# Patient Record
Sex: Female | Born: 1940 | Race: Black or African American | Hispanic: No | Marital: Single
Health system: Southern US, Community
[De-identification: ages and names within clinical notes are randomized; demographics above are authoritative.]

## PROBLEM LIST (undated history)

## (undated) DIAGNOSIS — R0789 Other chest pain: Secondary | ICD-10-CM

## (undated) DIAGNOSIS — F419 Anxiety disorder, unspecified: Secondary | ICD-10-CM

## (undated) DIAGNOSIS — C801 Malignant (primary) neoplasm, unspecified: Secondary | ICD-10-CM

## (undated) DIAGNOSIS — K635 Polyp of colon: Secondary | ICD-10-CM

## (undated) DIAGNOSIS — J339 Nasal polyp, unspecified: Secondary | ICD-10-CM

## (undated) DIAGNOSIS — E785 Hyperlipidemia, unspecified: Secondary | ICD-10-CM

## (undated) DIAGNOSIS — J302 Other seasonal allergic rhinitis: Secondary | ICD-10-CM

## (undated) DIAGNOSIS — M1990 Unspecified osteoarthritis, unspecified site: Secondary | ICD-10-CM

## (undated) DIAGNOSIS — C50919 Malignant neoplasm of unspecified site of unspecified female breast: Secondary | ICD-10-CM

## (undated) DIAGNOSIS — Z9889 Other specified postprocedural states: Secondary | ICD-10-CM

## (undated) DIAGNOSIS — I341 Nonrheumatic mitral (valve) prolapse: Secondary | ICD-10-CM

## (undated) DIAGNOSIS — K219 Gastro-esophageal reflux disease without esophagitis: Secondary | ICD-10-CM

## (undated) DIAGNOSIS — T7840XA Allergy, unspecified, initial encounter: Secondary | ICD-10-CM

## (undated) DIAGNOSIS — I10 Essential (primary) hypertension: Secondary | ICD-10-CM

## (undated) HISTORY — DX: Hyperlipidemia, unspecified: E78.5

## (undated) HISTORY — DX: Anxiety disorder, unspecified: F41.9

## (undated) HISTORY — DX: Nonrheumatic mitral (valve) prolapse: I34.1

## (undated) HISTORY — PX: VOCAL CORD INJECTION: SHX2663

## (undated) HISTORY — DX: Other chest pain: R07.89

## (undated) HISTORY — DX: Nasal polyp, unspecified: J33.9

## (undated) HISTORY — DX: Essential (primary) hypertension: I10

## (undated) HISTORY — DX: Polyp of colon: K63.5

## (undated) HISTORY — PX: OTHER SURGICAL HISTORY: SHX169

## (undated) HISTORY — PX: BUNIONECTOMY: SHX129

## (undated) HISTORY — DX: Other specified postprocedural states: Z98.890

## (undated) HISTORY — PX: ABDOMINAL HYSTERECTOMY: SHX81

## (undated) HISTORY — DX: Other seasonal allergic rhinitis: J30.2

## (undated) HISTORY — DX: Allergy, unspecified, initial encounter: T78.40XA

## (undated) HISTORY — DX: Unspecified osteoarthritis, unspecified site: M19.90

## (undated) HISTORY — PX: COLONOSCOPY: SHX174

## (undated) HISTORY — DX: Gastro-esophageal reflux disease without esophagitis: K21.9

## (undated) HISTORY — PX: LUNG SURGERY: SHX703

## (undated) HISTORY — DX: Malignant neoplasm of unspecified site of unspecified female breast: C50.919

## (undated) HISTORY — PX: POLYPECTOMY: SHX149

---
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
## 1997-09-27 ENCOUNTER — Emergency Department (HOSPITAL_COMMUNITY): Admission: EM | Admit: 1997-09-27 | Discharge: 1997-09-27 | Payer: Self-pay | Admitting: Emergency Medicine

## 1997-10-02 ENCOUNTER — Other Ambulatory Visit: Admission: RE | Admit: 1997-10-02 | Discharge: 1997-10-02 | Payer: Self-pay | Admitting: Obstetrics and Gynecology

## 1997-10-29 ENCOUNTER — Ambulatory Visit (HOSPITAL_BASED_OUTPATIENT_CLINIC_OR_DEPARTMENT_OTHER): Admission: RE | Admit: 1997-10-29 | Discharge: 1997-10-29 | Payer: Self-pay | Admitting: Orthopaedic Surgery

## 1999-01-14 ENCOUNTER — Emergency Department (HOSPITAL_COMMUNITY): Admission: EM | Admit: 1999-01-14 | Discharge: 1999-01-14 | Payer: Self-pay | Admitting: Emergency Medicine

## 1999-03-16 ENCOUNTER — Other Ambulatory Visit: Admission: RE | Admit: 1999-03-16 | Discharge: 1999-03-16 | Payer: Self-pay | Admitting: Obstetrics and Gynecology

## 1999-11-16 ENCOUNTER — Encounter: Payer: Self-pay | Admitting: Orthopaedic Surgery

## 1999-11-16 ENCOUNTER — Encounter: Admission: RE | Admit: 1999-11-16 | Discharge: 1999-11-16 | Payer: Self-pay | Admitting: Orthopaedic Surgery

## 1999-11-17 ENCOUNTER — Ambulatory Visit (HOSPITAL_BASED_OUTPATIENT_CLINIC_OR_DEPARTMENT_OTHER): Admission: RE | Admit: 1999-11-17 | Discharge: 1999-11-17 | Payer: Self-pay | Admitting: Orthopaedic Surgery

## 1999-11-17 HISTORY — PX: KNEE ARTHROSCOPY W/ PARTIAL MEDIAL MENISCECTOMY: SHX1882

## 1999-11-17 HISTORY — PX: KNEE ARTHROSCOPY W/ LATERAL RELEASE: SHX1873

## 2000-06-09 ENCOUNTER — Inpatient Hospital Stay (HOSPITAL_COMMUNITY): Admission: EM | Admit: 2000-06-09 | Discharge: 2000-06-10 | Payer: Self-pay | Admitting: Emergency Medicine

## 2000-06-09 ENCOUNTER — Encounter: Payer: Self-pay | Admitting: Emergency Medicine

## 2000-06-13 ENCOUNTER — Other Ambulatory Visit: Admission: RE | Admit: 2000-06-13 | Discharge: 2000-06-13 | Payer: Self-pay | Admitting: Obstetrics and Gynecology

## 2000-08-22 ENCOUNTER — Emergency Department (HOSPITAL_COMMUNITY): Admission: EM | Admit: 2000-08-22 | Discharge: 2000-08-22 | Payer: Self-pay | Admitting: Emergency Medicine

## 2000-08-23 ENCOUNTER — Emergency Department (HOSPITAL_COMMUNITY): Admission: EM | Admit: 2000-08-23 | Discharge: 2000-08-23 | Payer: Self-pay | Admitting: Emergency Medicine

## 2002-02-19 ENCOUNTER — Other Ambulatory Visit: Admission: RE | Admit: 2002-02-19 | Discharge: 2002-02-19 | Payer: Self-pay | Admitting: Obstetrics and Gynecology

## 2002-06-02 ENCOUNTER — Emergency Department (HOSPITAL_COMMUNITY): Admission: EM | Admit: 2002-06-02 | Discharge: 2002-06-02 | Payer: Self-pay | Admitting: Emergency Medicine

## 2002-06-02 ENCOUNTER — Encounter: Payer: Self-pay | Admitting: Emergency Medicine

## 2003-01-25 ENCOUNTER — Inpatient Hospital Stay (HOSPITAL_COMMUNITY): Admission: RE | Admit: 2003-01-25 | Discharge: 2003-01-31 | Payer: Self-pay | Admitting: General Surgery

## 2003-01-25 ENCOUNTER — Encounter (INDEPENDENT_AMBULATORY_CARE_PROVIDER_SITE_OTHER): Payer: Self-pay | Admitting: Specialist

## 2003-01-25 HISTORY — PX: OTHER SURGICAL HISTORY: SHX169

## 2003-04-04 ENCOUNTER — Ambulatory Visit (HOSPITAL_COMMUNITY): Admission: RE | Admit: 2003-04-04 | Discharge: 2003-04-04 | Payer: Self-pay | Admitting: Internal Medicine

## 2003-05-03 ENCOUNTER — Encounter: Admission: RE | Admit: 2003-05-03 | Discharge: 2003-05-03 | Payer: Self-pay | Admitting: Internal Medicine

## 2004-03-10 ENCOUNTER — Ambulatory Visit: Payer: Self-pay | Admitting: Internal Medicine

## 2004-03-30 ENCOUNTER — Encounter (INDEPENDENT_AMBULATORY_CARE_PROVIDER_SITE_OTHER): Payer: Self-pay | Admitting: Specialist

## 2004-03-30 ENCOUNTER — Ambulatory Visit (HOSPITAL_COMMUNITY): Admission: RE | Admit: 2004-03-30 | Discharge: 2004-03-30 | Payer: Self-pay | Admitting: Internal Medicine

## 2005-03-05 ENCOUNTER — Emergency Department (HOSPITAL_COMMUNITY): Admission: EM | Admit: 2005-03-05 | Discharge: 2005-03-05 | Payer: Self-pay | Admitting: Family Medicine

## 2005-03-08 ENCOUNTER — Emergency Department (HOSPITAL_COMMUNITY): Admission: EM | Admit: 2005-03-08 | Discharge: 2005-03-08 | Payer: Self-pay | Admitting: Emergency Medicine

## 2005-11-02 ENCOUNTER — Encounter: Admission: RE | Admit: 2005-11-02 | Discharge: 2005-11-02 | Payer: Self-pay | Admitting: Internal Medicine

## 2006-03-11 ENCOUNTER — Encounter: Admission: RE | Admit: 2006-03-11 | Discharge: 2006-03-11 | Payer: Self-pay | Admitting: Pediatrics

## 2006-05-15 ENCOUNTER — Ambulatory Visit: Payer: Self-pay | Admitting: Internal Medicine

## 2006-05-15 ENCOUNTER — Observation Stay (HOSPITAL_COMMUNITY): Admission: EM | Admit: 2006-05-15 | Discharge: 2006-05-16 | Payer: Self-pay | Admitting: Emergency Medicine

## 2006-05-18 ENCOUNTER — Ambulatory Visit: Payer: Self-pay

## 2006-06-07 ENCOUNTER — Ambulatory Visit: Payer: Self-pay | Admitting: Internal Medicine

## 2006-07-21 ENCOUNTER — Ambulatory Visit: Payer: Self-pay | Admitting: Internal Medicine

## 2006-08-03 ENCOUNTER — Encounter: Payer: Self-pay | Admitting: Cardiology

## 2006-08-03 ENCOUNTER — Ambulatory Visit: Payer: Self-pay

## 2006-08-24 ENCOUNTER — Ambulatory Visit: Payer: Self-pay | Admitting: Internal Medicine

## 2006-08-31 ENCOUNTER — Emergency Department (HOSPITAL_COMMUNITY): Admission: EM | Admit: 2006-08-31 | Discharge: 2006-08-31 | Payer: Self-pay | Admitting: Family Medicine

## 2006-09-01 ENCOUNTER — Ambulatory Visit: Payer: Self-pay

## 2007-02-01 ENCOUNTER — Ambulatory Visit: Payer: Self-pay | Admitting: Internal Medicine

## 2007-08-28 ENCOUNTER — Ambulatory Visit: Payer: Self-pay | Admitting: Internal Medicine

## 2008-02-27 ENCOUNTER — Ambulatory Visit: Payer: Self-pay | Admitting: Internal Medicine

## 2008-03-25 ENCOUNTER — Ambulatory Visit: Payer: Self-pay | Admitting: Internal Medicine

## 2008-04-26 HISTORY — PX: BREAST SURGERY: SHX581

## 2008-07-29 ENCOUNTER — Ambulatory Visit: Payer: Self-pay | Admitting: Internal Medicine

## 2008-07-30 ENCOUNTER — Encounter: Admission: RE | Admit: 2008-07-30 | Discharge: 2008-07-30 | Payer: Self-pay | Admitting: Internal Medicine

## 2008-08-27 ENCOUNTER — Ambulatory Visit: Payer: Self-pay | Admitting: Internal Medicine

## 2008-08-28 ENCOUNTER — Encounter (INDEPENDENT_AMBULATORY_CARE_PROVIDER_SITE_OTHER): Payer: Self-pay | Admitting: Otolaryngology

## 2008-08-28 ENCOUNTER — Ambulatory Visit (HOSPITAL_COMMUNITY): Admission: RE | Admit: 2008-08-28 | Discharge: 2008-08-29 | Payer: Self-pay | Admitting: Otolaryngology

## 2008-08-28 HISTORY — PX: FUNCTIONAL ENDOSCOPIC SINUS SURGERY: SUR616

## 2008-11-26 ENCOUNTER — Emergency Department (HOSPITAL_COMMUNITY): Admission: EM | Admit: 2008-11-26 | Discharge: 2008-11-26 | Payer: Self-pay | Admitting: Emergency Medicine

## 2008-11-26 ENCOUNTER — Ambulatory Visit: Payer: Self-pay | Admitting: Internal Medicine

## 2009-01-06 ENCOUNTER — Encounter (INDEPENDENT_AMBULATORY_CARE_PROVIDER_SITE_OTHER): Payer: Self-pay | Admitting: *Deleted

## 2009-01-17 ENCOUNTER — Ambulatory Visit: Payer: Self-pay | Admitting: Internal Medicine

## 2009-01-17 DIAGNOSIS — I10 Essential (primary) hypertension: Secondary | ICD-10-CM

## 2009-01-17 DIAGNOSIS — R072 Precordial pain: Secondary | ICD-10-CM

## 2009-03-04 ENCOUNTER — Encounter (INDEPENDENT_AMBULATORY_CARE_PROVIDER_SITE_OTHER): Payer: Self-pay | Admitting: *Deleted

## 2009-03-04 ENCOUNTER — Ambulatory Visit: Payer: Self-pay | Admitting: Internal Medicine

## 2009-03-18 ENCOUNTER — Ambulatory Visit: Payer: Self-pay | Admitting: Internal Medicine

## 2009-03-18 DIAGNOSIS — Z9889 Other specified postprocedural states: Secondary | ICD-10-CM | POA: Insufficient documentation

## 2009-03-18 HISTORY — DX: Other specified postprocedural states: Z98.890

## 2009-03-25 ENCOUNTER — Encounter: Payer: Self-pay | Admitting: Internal Medicine

## 2009-03-27 LAB — HM COLONOSCOPY

## 2009-04-07 ENCOUNTER — Ambulatory Visit: Payer: Self-pay | Admitting: Internal Medicine

## 2009-09-17 ENCOUNTER — Emergency Department (HOSPITAL_COMMUNITY): Admission: EM | Admit: 2009-09-17 | Discharge: 2009-09-17 | Payer: Self-pay | Admitting: Family Medicine

## 2009-10-07 ENCOUNTER — Ambulatory Visit: Payer: Self-pay | Admitting: Internal Medicine

## 2010-04-07 ENCOUNTER — Ambulatory Visit: Payer: Self-pay | Admitting: Internal Medicine

## 2010-08-02 LAB — POCT I-STAT, CHEM 8
BUN: 21 mg/dL (ref 6–23)
Calcium, Ion: 1.17 mmol/L (ref 1.12–1.32)
Chloride: 108 mEq/L (ref 96–112)
Creatinine, Ser: 0.7 mg/dL (ref 0.4–1.2)
Glucose, Bld: 100 mg/dL — ABNORMAL HIGH (ref 70–99)
HCT: 35 % — ABNORMAL LOW (ref 36.0–46.0)
Hemoglobin: 11.9 g/dL — ABNORMAL LOW (ref 12.0–15.0)
Potassium: 3.8 mEq/L (ref 3.5–5.1)
Sodium: 138 mEq/L (ref 135–145)
TCO2: 24 mmol/L (ref 0–100)

## 2010-08-02 LAB — POCT CARDIAC MARKERS
CKMB, poc: <1 ng/mL — ABNORMAL LOW (ref 1.0–8.0)
CKMB, poc: <1 ng/mL — ABNORMAL LOW (ref 1.0–8.0)
Myoglobin, poc: 71 ng/mL (ref 12–200)
Myoglobin, poc: 97.9 ng/mL (ref 12–200)
Troponin i, poc: <0.05 ng/mL (ref 0.00–0.09)
Troponin i, poc: <0.05 ng/mL (ref 0.00–0.09)

## 2010-08-02 LAB — CBC
HCT: 34.3 % — ABNORMAL LOW (ref 36.0–46.0)
Hemoglobin: 11.7 g/dL — ABNORMAL LOW (ref 12.0–15.0)
MCHC: 34.1 g/dL (ref 30.0–36.0)
MCV: 91.8 fL (ref 78.0–100.0)
Platelets: 264 10*3/uL (ref 150–400)
RBC: 3.74 MIL/uL — ABNORMAL LOW (ref 3.87–5.11)
RDW: 13.2 % (ref 11.5–15.5)
WBC: 6.3 10*3/uL (ref 4.0–10.5)

## 2010-08-02 LAB — PROTIME-INR
INR: 1 (ref 0.00–1.49)
Prothrombin Time: 13.5 seconds (ref 11.6–15.2)

## 2010-08-02 LAB — DIFFERENTIAL
Basophils Absolute: 0 10*3/uL (ref 0.0–0.1)
Basophils Relative: 0 % (ref 0–1)
Eosinophils Absolute: 0.8 10*3/uL — ABNORMAL HIGH (ref 0.0–0.7)
Eosinophils Relative: 12 % — ABNORMAL HIGH (ref 0–5)
Lymphocytes Relative: 18 % (ref 12–46)
Lymphs Abs: 1.1 10*3/uL (ref 0.7–4.0)
Monocytes Absolute: 0.8 10*3/uL (ref 0.1–1.0)
Monocytes Relative: 12 % (ref 3–12)
Neutro Abs: 3.6 10*3/uL (ref 1.7–7.7)
Neutrophils Relative %: 57 % (ref 43–77)

## 2010-08-05 LAB — CBC
HCT: 38.2 % (ref 36.0–46.0)
Hemoglobin: 13.2 g/dL (ref 12.0–15.0)
MCHC: 34.5 g/dL (ref 30.0–36.0)
MCV: 92.8 fL (ref 78.0–100.0)
Platelets: 231 10*3/uL (ref 150–400)
RBC: 4.12 MIL/uL (ref 3.87–5.11)
RDW: 13.1 % (ref 11.5–15.5)
WBC: 5 10*3/uL (ref 4.0–10.5)

## 2010-08-05 LAB — BASIC METABOLIC PANEL
BUN: 15 mg/dL (ref 6–23)
CO2: 29 mEq/L (ref 19–32)
Calcium: 9.6 mg/dL (ref 8.4–10.5)
Chloride: 106 mEq/L (ref 96–112)
Creatinine, Ser: 0.59 mg/dL (ref 0.4–1.2)
GFR calc Af Amer: >60 mL/min (ref >60)
GFR calc non Af Amer: >60 mL/min (ref >60)
Glucose, Bld: 111 mg/dL — ABNORMAL HIGH (ref 70–99)
Potassium: 3.7 mEq/L (ref 3.5–5.1)
Sodium: 141 mEq/L (ref 135–145)

## 2010-08-05 LAB — URINE MICROSCOPIC-ADD ON

## 2010-08-05 LAB — URINALYSIS, ROUTINE W REFLEX MICROSCOPIC
Bilirubin Urine: NEGATIVE
Glucose, UA: NEGATIVE mg/dL
Hgb urine dipstick: NEGATIVE
Ketones, ur: NEGATIVE mg/dL
Nitrite: NEGATIVE
Protein, ur: NEGATIVE mg/dL
Specific Gravity, Urine: 1.018 (ref 1.005–1.030)
Urobilinogen, UA: 0.2 mg/dL (ref 0.0–1.0)
pH: 6.5 (ref 5.0–8.0)

## 2010-08-11 ENCOUNTER — Other Ambulatory Visit: Payer: Self-pay | Admitting: Internal Medicine

## 2010-08-22 ENCOUNTER — Encounter: Payer: Self-pay | Admitting: Internal Medicine

## 2010-08-25 ENCOUNTER — Encounter: Payer: Self-pay | Admitting: Internal Medicine

## 2010-08-25 ENCOUNTER — Ambulatory Visit (INDEPENDENT_AMBULATORY_CARE_PROVIDER_SITE_OTHER): Payer: Medicare Other | Admitting: Internal Medicine

## 2010-08-25 VITALS — BP 116/66 | HR 65 | Ht 62.5 in | Wt 135.0 lb

## 2010-08-25 DIAGNOSIS — I10 Essential (primary) hypertension: Secondary | ICD-10-CM

## 2010-08-25 DIAGNOSIS — R072 Precordial pain: Secondary | ICD-10-CM

## 2010-08-25 NOTE — Assessment & Plan Note (Signed)
Resolved. Myoview normal. No further ischemic w/u needed at this point.

## 2010-08-25 NOTE — Progress Notes (Signed)
HPI:  Amanda Jackson is a delightful 70 year old woman history of hypertension and atypical chest pain with a normal Myoview.  She also has seasonal allergies.  She presents today for routine followup.  Otherwise she is doing very well.  She continues to work at [employer] and be quite active. She is under a lot of stress due to her son. She denies any orthopnea, no PND, no lower extremity edema.  She has been compliant with all her medications.BP has been well controlled.  ROS: All systems negative except as listed in HPI, PMH and Problem List.  Past Medical History  Diagnosis Date  . Hyperlipidemia     followed by Dr. Lenord Fellers  . Hypertension   . Atypical chest pain   . Seasonal allergies   . Hx of colonoscopy 03/18/09    Current Outpatient Prescriptions  Medication Sig Dispense Refill  . albuterol (PROVENTIL HFA) 108 (90 BASE) MCG/ACT inhaler as needed.        Marland Kitchen amLODipine (NORVASC) 10 MG tablet TAKE 1 TABLET BY MOUTH TWICE A DAY  60 tablet  0  . aspirin 81 MG EC tablet Take 81 mg by mouth daily.        . carvedilol (COREG) 12.5 MG tablet Take 12.5 mg by mouth 2 (two) times daily.        . cyclobenzaprine (FLEXERIL) 10 MG tablet Take 10 mg by mouth at bedtime.        . folic acid-pyridoxine-cyancobalamin (FOLBIC) 2.5-25-2 MG TABS Take 1 tablet by mouth daily.        Marland Kitchen guaiFENesin (MUCINEX) 600 MG 12 hr tablet Take 600 mg by mouth daily.        Marland Kitchen ibuprofen (ADVIL,MOTRIN) 800 MG tablet as needed.        Marland Kitchen LORazepam (ATIVAN) 0.5 MG tablet Take 0.5 mg by mouth 2 (two) times daily as needed.        Marland Kitchen losartan-hydrochlorothiazide (HYZAAR) 100-25 MG per tablet Take 1 tablet by mouth daily.        . mometasone (ASMANEX 60 METERED DOSES) 220 MCG/INH inhaler Inhale 2 puffs into the lungs daily.        . mometasone (NASONEX) 50 MCG/ACT nasal spray 2 sprays by Nasal route 2 (two) times daily.        . montelukast (SINGULAIR) 10 MG tablet Take 10 mg by mouth daily.        . Multiple Vitamins-Minerals  (CENTRUM) tablet Take 1 tablet by mouth daily.        . nitroGLYCERIN (NITROSTAT) 0.4 MG SL tablet as directed.        Marland Kitchen omeprazole (PRILOSEC) 20 MG capsule Take 20 mg by mouth daily.        . potassium chloride SA (K-DUR,KLOR-CON) 20 MEQ tablet Take 20 mEq by mouth 2 (two) times daily.        Marland Kitchen DISCONTD: Pseudoephedrine-Guaifenesin (EXEFEN-IR) 60-400 MG TABS Take 1/2 tablet as needed.          PHYSICAL EXAM: Filed Vitals:   08/25/10 0843  BP: 116/66  Pulse: 65  Manual BP recheck 104/58 General:  Well appearing. No resp difficulty HEENT: normal Neck: supple. JVP flat. Carotids 2+ bilaterally; no bruits. No lymphadenopathy or thryomegaly appreciated. Cor: PMI normal. Regular rate & rhythm. No rubs, gallops or murmurs. Lungs: clear Abdomen: soft, nontender, nondistended. No hepatosplenomegaly. No bruits or masses. Good bowel sounds. Extremities: no cyanosis, clubbing, rash, edema Neuro: alert & orientedx3, cranial nerves grossly intact. Moves all 4 extremities w/o  difficulty. Affect pleasant.    ECG: NSR 65 No ST-T wave abnormalities.     ASSESSMENT & PLAN:

## 2010-08-25 NOTE — Patient Instructions (Signed)
Your physician recommends that you schedule a follow-up appointment in: 12 months with Dr Bensimhon   

## 2010-08-25 NOTE — Assessment & Plan Note (Signed)
Doing very well. BP a bit on low end and I told her she can cut amlodipine in half if symptomatic.

## 2010-09-08 NOTE — Consult Note (Signed)
Amanda Jackson, Amanda Jackson NO.:  [id number]   MEDICAL RECORD NO.:  [id number]          PATIENT TYPE:  EMS   LOCATION:  MAJO                         FACILITY:  [facility]   PHYSICIAN:  Duke Salvia, MD, FACCDATE OF BIRTH:  05-Jul-1940   DATE OF CONSULTATION:  11/26/2008  DATE OF DISCHARGE:  11/26/2008                                 CONSULTATION   PRIMARY CARDIOLOGIST:  Bevelyn Buckles. Bensimhon, MD   PRIMARY CARE PHYSICIAN:  Luanna Cole. Baxley, MD   REASON FOR CONSULTATION:  Back pain/chest pain.   HISTORY OF PRESENT ILLNESS:  Amanda Jackson is a 70 year old African American  female with no known history of CAD, but history of atypical chest pain  with negative workup in 2008, but as well as risk factors including well-  controlled hypertension and hyperlipidemia, presenting with 2 days of  sharp left upper back pain and mild burning chest pain associated  intermittently.  The patient reports positional, sharp, brief (lasting  few seconds), upper left back pain without association with exertion,  nor any nausea/vomiting, palpitations, or shortness of breath.  Today,  the patient had an episode of mild burning chest discomfort in the  substernal area, which she has experienced many times before and after  reading about the possibility of back pain being indication of coronary  artery disease in women, she decided to present to the ED for further  eval.  Since arriving in the ED, her vital signs have been stable and  within normal limits, point of care markers are negative x1, and EKG  without acute changes, nor any significant change from prior tracings.   PAST MEDICAL HISTORY:  1. History of atypical chest pain with negative Myoview and normal EF      in 2008.  2. Hypertension (diet controlled on meds).  3. Hyperlipidemia (diet controlled).  4. Asthma.  5. Seasonal allergies.   SURGICAL HISTORY:  1. Colectomy in 2004.  2. Hysterectomy.  3. Bunionectomy.  4. Right knee  surgery.  5. Bilateral ethmoidectomies and bilateral maxillary sinus ostial      enlargement.   SOCIAL HISTORY:  The patient lives in [place] with her housemate.  She works part-time at [employer] and is active on the job.  She has no  significant smoking history.  Drinks one to two glasses of wine or a  beer every day.  Denies illicit drug use.  No herbal medications.  Eats  a heart healthy diet and walks all days at work, otherwise no regular  exercise.   FAMILY HISTORY:  Mother deceased at age 58 from liver cancer, also  history of hypertension.  Father deceased at age 30 from lung cancer.  Younger sister has history of colon cancer.   REVIEW OF SYSTEMS:  Chronic orthopnea without recent change, minimal  increased to dyspnea on exertion with increased seep upstairs, recurrent  left knee pain currently slightly exacerbated, mild lightheadedness in  the shower today, but without near syncope.  Otherwise, see HPI.  All  other systems reviewed and negative.   CODE STATUS:  Full.   ALLERGIES AND  INTOLERANCES:  1. PREDNISONE.  2. TETANUS TOXOID.   MEDICATIONS:  1. Hyzaar 100/25 mg p.o. daily.  2. Potassium 20 mEq p.o. daily.  3. Enteric-coated aspirin 81 mg p.o. daily.  4. Nasonex p.r.n.  5. Asmanex 220 two puffs daily.  6. Multivitamin daily.  7. Carvedilol 12.5 mg p.o. b.i.d.  8. Singulair p.r.n.  9. Norvasc 10 mg p.o. daily.  10.Folbic tablets.  11.Premarin Vaginal cream.  12.Lorazepam 0.5 mg p.o. b.i.d. p.r.n.  13.Proventil HFA two puffs p.r.n.  14.Ibuprofen 800 mg p.r.n. (the patient has only taken one today).  15.Calcium 600 mg plus D3 two tablets daily.   PHYSICAL EXAMINATION:  VITAL SIGNS:  Temperature 97.8 degrees  Fahrenheit, BP 108/63, pulse 71, respiration rate 20, O2 saturation 97%  on room air.  GENERAL:  Alert and oriented x3 in no apparent distress, able to speak  easily without respiratory distress.  The patient winces when sitting up  during exam.  HEAD:   Normocephalic, atraumatic.  Pupils are equal, round, and  reactive.  Extraocular muscles are intact.  Nares are patent without  discharge.  Oropharynx is without erythema or exudates.  NECK:  Supple without lymphadenopathy.  No thyromegaly.  No JVD.  HEART:  Rate is regular with audible S1 and S2.  No clicks, rubs,  murmurs, or gallops.  Pulses are 2+ and equal in both upper and lower  extremities bilaterally.  LUNGS:  Clear to auscultation bilaterally.  SKIN:  Without rashes, lesions, or petechiae.  ABDOMEN:  Soft, nontender, nondistended.  Normal abdominal bowel sounds.  No rebound or guarding.  No hepatosplenomegaly and no pulsations.  EXTREMITIES:  No clubbing, cyanosis, or edema.  No petechiae.  MUSCULOSKELETAL:  There was no joint deformity, mild left knee effusion.  No spinal or CVA tenderness.  Noted, left upper back is not tender.  NEUROLOGIC:  Cranial nerves II through XII grossly intact.  Strength is  5/5 in all extremities and axial groups.  Normal station throughout.  Normal cerebellar function.   RADIOLOGY:  Chest x-ray shows no evidence of acute cardiopulmonary  disease.   EKG shows normal sinus rhythm at a rate of 84 with an no acute ST-T-wave  changes, no significant Q-waves, normal axis, no evidence of  hypertrophy, intervals within normal limits, and no significant change  from prior tracing completed on May 15, 2006.   LABS:  WBC 6.3 with normal differential, HGB 11.9, HCT 55.0, PLT count  264.  Sodium 138, potassium 3.8, chloride 108, BUN 21, creatinine 0.7,  glucose 100, point of care markers negative x1, PT 13.5, INR 1.0.   ASSESSMENT AND PLAN:  Amanda Jackson is a 70 year old African American female  with no known history of coronary artery disease, a negative workup in  2008 with a normal ejection fraction.  Her only risk factors being age,  well-controlled hypertension, and hyperlipidemia, presenting with  atypical chest pain/back pain without any objective  evidence of a  cardiac etiology.   IMPRESSION:  1. Four days of severe positional back pain, unrelenting and      aggravated by movement and breathing.  2. Atypical hour recurrent epigastric pain similar to the previous      chest pain evaluation by Dr. Gala Romney with negative Myoview.  3. Hypertension.  4. Allergic rhinitis.   Major complaint is her back pain, which is most likely musculoskeletal.  Her chest pain/epigastric discomfort has been previously evaluated and  is felt not to be a cardiac in origin (symptoms unchanged).  RECOMMENDATIONS:  1. Medically eval for back issue.  2. We will schedule follow up with Dr. Gala Romney.  3. Could check D-dimer, although low pretest probability.  4. Okay to discharge from ER from cardiology perspective.      Jarrett Ables, [facility]      Duke Salvia, MD, [facility]  Electronically Signed    MS/MEDQ  D:  11/26/2008  T:  11/27/2008  Job:  [id number]

## 2010-09-08 NOTE — Assessment & Plan Note (Signed)
[facility]                            CARDIOLOGY OFFICE NOTE   Amanda Jackson, Amanda Jackson                       MRN:          [id number]  DATE:03/25/2008                            DOB:          Jun 29, 1940    PRIMARY CARE PHYSICIAN:  Amanda Cole. Lenord Fellers, MD.   INTERVAL HISTORY:  Amanda Jackson is a delightful 70 year old woman history of  hypertension and atypical chest pain with a normal Myoview.  She also  has seasonal allergies.  She presents today for routine followup.  Overall, she is doing fairly well.  She continues to work at [employer] and  be quite active.  She does have occasional atypical chest pain, but this  is not related to exertion.  It is a fleeting pain.  She has not been  following her blood pressure closely.  She denies any orthopnea, no PND,  no lower extremity edema.  She has been compliant with all her  medications.   She does note that she has been having a difficult time, as she is  worried about her 75 year old granddaughter who is living with her  daughter-in-law, and she feels like she is not getting the care she  needs.   CURRENT MEDICATIONS:  1. Hyzaar 100/25.  2. Potassium 20 a day.  3. Baby aspirin 81 a day.  4. Nasonex.  5. Asmanex.  6. Multivitamin.  7. Carvedilol 12.5 b.i.d.  8. Singulair.  9. Norvasc 10 b.i.d.  10.Folbic tablets.   PHYSICAL EXAMINATION:  GENERAL:  She looks younger than her stated age,  ambulates around the clinic without any respiratory difficulty.  VITAL SIGNS:  Blood pressure is 108/66, heart rate is 67, weight is 143.  HEENT:  Normal.  NECK:  Supple.  There is no JVD.  Carotids are 2+ bilaterally without  any bruits.  There is no lymphadenopathy or thyromegaly.  CARDIAC:  PMI  is nondisplaced.  She is regular with an S4, no murmurs.  LUNGS:  Clear.  ABDOMEN:  Soft, nontender, nondistended.  No hepatosplenomegaly, no  bruits, no masses.  Good bowel sounds.  EXTREMITIES:  Warm with no  cyanosis, clubbing,  or edema.  SKIN:  No rash.  NEURO:  Alert and oriented x3.  Cranial nerves II through XII are  intact.  Moves all 4 extremities without difficulty.  Affect is very  pleasant.   EKG shows sinus rhythm at a rate of 67.  No ST-T wave abnormalities.   ASSESSMENT AND PLAN:  1. Chest pain, this is atypical.  Myoview is normal.  We will not      pursue further workup.  2. Hypertension, well controlled.  Continue current regimen.  3. Hyperlipidemia followed by Dr. Lenord Jackson.   DISPOSITION:  We will see her back in 6-9 months for routine followup.     Bevelyn Buckles. Bensimhon, MD  Electronically Signed    DRB/MedQ  DD: 03/25/2008  DT: 03/26/2008  Job #: [id number]   cc:   Amanda Jackson, M.D.

## 2010-09-08 NOTE — Assessment & Plan Note (Signed)
[facility]                            CARDIOLOGY OFFICE NOTE   Amanda Jackson, Amanda Jackson                       MRN:          [id number]  DATE:08/24/2006                            DOB:          [birth year]    PRIMARY CARE PHYSICIAN:  Luanna Cole. Lenord Fellers, M.D.   INTERVAL HISTORY:  Amanda Jackson is a delightful, 70 year old woman with a  history of hypertension, atypical chest pain, and a recent Myoview that  showed an EF of 67 percent, no ischemia.  She presents today for routine  followup of her blood pressure.   Overall, she is doing well.  She denies any chest pain or shortness of  breath.  She did have an episode of about one hour where she felt numb  on her right arm and her right leg.  There was no real motor weakness,  no other neurologic signs.  She denies any slurred speech or other  problems.  This has not recurred.  She has not been taking her blood  pressure routinely.   CURRENT MEDICATIONS:  1. Hyzaar 100/25.  2. Potassium 20 b.i.d.  3. Ativan 0.5 b.i.d.  4. Baby aspirin 81 mg a day.  5. Nasonex.  6. Asmanex.  7. Carvedilol  12.5 b.i.d.  8. Norvasc 10 a day.   PHYSICAL EXAMINATION:  GENERAL:  She is well-appearing, in no acute  distress.  Ambulates around the clinic without any respiratory  difficulty.  VITAL SIGNS:  Blood pressure 131/80.  Heart rate 85.  Weight 142.  HEENT:  Normal.  NECK:  Supple.  No JVD.  Carotids are 2+ bilaterally, no bruits.  There  is no lymphadenopathy or thyromegaly.  CARDIAC:  She has a regular rate and rhythm with no murmurs, rubs or  gallops.  LUNGS:  Clear.  ABDOMEN:  Soft, nontender, nondistended.  No hepatosplenomegaly, no  bruits, no masses.  Good bowel sounds.  EXTREMITIES:  Warm with no cyanosis, clubbing, or edema.  No rash.  NEUROLOGIC:  She was alert and oriented x3.  Cranial nerves II-XII were  grossly intact.  She moves all four extremities without difficulty.  There is no pronator drift.  Her grip  strength is normal.  She has  normal strength in both lower extremities.   ASSESSMENT/PLAN:  1. Hypertension.  This is still just a bit elevated.  She does have an      electronic blood pressure cuff at home and I have asked her to keep      a blood pressure log and watch closely.  If she has multiple      readings over 130, then she is to call me or Dr. Lenord Fellers to further      discuss.  2. Right-sided numbness.  I do not think this represents transient      ischemic attack symptoms but I do think it warrants a carotid      ultrasound just to be sure.  We will go ahead and I have taken the      liberty to order this.   DISPOSITION:  We will see her  back in about six months for routine  followup.     Bevelyn Buckles. Bensimhon, MD     DRB/MedQ  DD: 08/24/2006  DT: 08/25/2006  Job #: [id number]   cc:   Luanna Cole. Lenord Fellers, M.D.

## 2010-09-08 NOTE — H&P (Signed)
NAMEGEANINE, Amanda Jackson NO.:  [id number]   MEDICAL RECORD NO.:  [id number]          PATIENT TYPE:  OIB   LOCATION:  5158                         FACILITY:  [facility]   PHYSICIAN:  Hermelinda Medicus, M.D.   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  She has had obstruction of her nose.  She has also been worked up  completely for allergies and has been treated in the past for allergic  rhinitis.  She has more recently been on Singulair and has been in the  past on Claritin.  Under Dr. Beaulah Dinning care, she has been on antibiotics,  using Augmentin, amoxicillin, and Zithromax.  Under my care, she has  also been on prednisone 10 mg twice a day.  She furthermore has had  considerable asthma issues, felt to be partially caused by this ethmoid  and maxillary sinusitis.  A recent CAT scan was completed and this shows  bilateral ethmoid and maxillary sinusitis with fluid levels on both  sides with mucous levels in the maxillary and considerable mucoid  material in the ethmoids and slightly on the right frontal.  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   PAST HISTORY:  Remarkable in the fact that not only does she have asthma  and allergies, but has also been evaluated for hypertension and anxiety  and has had a complete cardiology workup.  She did have some atypical  chest pain, but the Myoview was found to be normal and hypertension well  controlled, blood pressure 120/80, 107/71, and hyperlipidemia and  followed by Dr. Lenord Fellers.  Her EKG today is within normal limits.   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  She also had a bunionectomy and  a right knee surgery.   ALLERGIES:  Questionable allergy to PREDNISONE, but this is not true.  She has done well with this and her only allergy is that of TETANUS  TOXOID.   PHYSICAL EXAMINATION:  VITAL SIGNS:  Her blood pressure is 107/71 with a  pulse of 70.  HEENT:  The ears are clear.  The tympanic membranes are clear.  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  Her CAT scan also shows  considerable mucous membrane edema and swelling in the frontal ethmoid  on the right and also on ethmoid on the left as well as maxillary sinus  fluid levels.  Her oral cavity and nasopharynx are clear of any  ulceration or mass.  Her larynx is clear.  True cords, false cords,  epiglottis, base of tongue, lateral pharyngeal walls were all clear of  ulceration or  mass.  True cord mobility, gag reflex, tongue mobility,  EOMs, and facial nerve were all symmetrical.  NECK:  Free of any thyromegaly, cervical adenopathy or mass and no  salivary gland abnormalities.  CHEST:  Clear.  No rales, rhonchi or wheezes.  CARDIOVASCULAR:  No opening snaps, murmurs, or gallops.  EKG is noted  within normal limits.  ABDOMEN:  Essentially been evaluated.  EXTREMITIES:  Essentially been evaluated.   PLAN:  Bilateral ethmoidectomy, concha bullosa reduction and bilateral  maxillary sinus ostial enlargements and we will keep her overnight  because of her health history, because of the asthma history and her  atypical chest pain.  She has been evaluated for also hepatic  evaluation, but she has had no hepatosplenomegaly and she has done well  in that  category.  Her plan is to do the sinus surgery.  She is well aware of  the risk and gains of active bleeding, the fact that we are working near  her eyes, near the base of her skull, and we will have CAT scans, the  scope, the direct vision and we will  be doing this under local MAC  anesthesia.           ______________________________  Hermelinda Medicus, M.D.     JC/MEDQ  D:  08/28/2008  T:  08/29/2008  Job:  [id number]   cc:   Bevelyn Buckles. Bensimhon, MD  Luanna Cole. Lenord Fellers, M.D.  Stephannie Li, M.D.

## 2010-09-08 NOTE — Assessment & Plan Note (Signed)
[facility]                            CARDIOLOGY OFFICE NOTE   Amanda Jackson, Amanda Jackson                       MRN:          [id number]  DATE:02/01/2007                            DOB:          05-31-1940    PRIMARY CARE PHYSICIAN:  Dr. Eden Emms Jackson.   INTERVAL HISTORY:  Amanda Jackson is a delightful 70 year old woman with a  history of hypertension, atypical chest pain and allergies.  She had a  Myoview earlier this year which showed an EF of 67% and no ischemia.  She presents today for routine followup.   Overall, she is doing well.  She continues to work in [employer] and is very  active without any chest pain or dyspnea.  She did have 1 episode of  chest pain last week when she was lying down after eating a spaghetti  meal and had some indigestion with chest pain.  She took 2  nitroglycerins, but felt better, but it eventually went away by itself  in 30 minutes.  She has not had any recurrent symptoms.  She has had  some shortness of breath with an upper respiratory infection recently,  but has been treated for this.  She denies orthopnea or PND, no lower  extremity edema.  She has not figured out how to work her electronic  blood pressure cuff, so has not been monitoring that closely.   CURRENT MEDICATIONS:  1. Hyzaar 200/25.  2. Potassium 20 b.i.d.  3. Lorazepam 0.5 b.i.d.  4. Baby aspirin 81 a day.  5. Nasonex.  6. Asmanex.  7. Calcium.  8. Carvedilol 12.5 b.i.d.  9. Norvasc10 mg b.i.d.  10.Prednisone.  11.Augmentin.   PHYSICAL EXAMINATION:  She looks younger than her stated age.  She  ambulates around the clinic without any respiratory difficulty.  Blood pressure is 128/62.  Heart rate is 62.  Weight is 140.  HEENT:  Normal.  NECK:  Supple.  There is no JVD.  Carotids are 2+ bilaterally without  any bruits.  There is no lymphadenopathy or thyromegaly.  CARDIAC:  PMI is nondisplaced.  She has a regular rate and rhythm,  minimal systolic  ejection murmur at the left sternal border.  There is  no rub or gallop.  LUNGS:  Clear.  ABDOMEN:  Soft, nontender and non-distended.  There is no  hepatosplenomegaly, no bruits, no masses.  Good bowel sounds.  EXTREMITIES:  Warm with no cyanosis, clubbing or edema.  No rash.  NEUROLOGIC:  Alert and oriented x3.  Cranial nerves II-XII are intact.  She moves all 4 extremities without difficulty.  Affect is very  pleasant.   EKG:  Shows normal sinus rhythm with a sinus arrhythmia and a rate of  62, no ST-T-wave abnormalities.   ASSESSMENT:  1. Hypertension, much improved.  Continue current therapy.  2. Chest pain; this is quite atypical.  I do think it probably      represents indigestion.  She has had a normal Myoview recently.      Should she develop exertional symptoms, I told her to contact me.   DISPOSITION:  Continue current therapy.  Return to clinic in 6 months  for routine followup.     Amanda Buckles. Bensimhon, MD  Electronically Signed    DRB/MedQ  DD: 02/01/2007  DT: 02/02/2007  Job #: [id number]   cc:   Amanda Jackson, M.D.

## 2010-09-08 NOTE — Assessment & Plan Note (Signed)
[facility]                            CARDIOLOGY OFFICE NOTE   CYNIA, ABRUZZO                       MRN:          [id number]  DATE:08/28/2007                            DOB:          11/28/1940    PRIMARY CARE PHYSICIAN:  Luanna Cole. Lenord Fellers, M.D.   INTERVAL HISTORY:  Ms. Sobczak a very pleasant 70 year old with a history  of hypertension, atypical chest pain with a normal Myoview and  allergies.  She presents today for routine follow-up of her blood  pressure.   Overall she is doing quite well.  She denies any chest pain.  She has  some chronic exertional dyspnea which is unchanged.  No chest pain.  She  was recently started on a prednisone taper due to her allergies.  She  has been compliant with all her medications.   CURRENT MEDICATIONS:  1. Hyzaar 100/25.  2. Potassium 20 mEq b.i.d.  3. Baby aspirin 81 mg.  4. Claritin 10 mg a day.  5. Nasonex.  6. Asmanex.  7. Calcium.  8. Carvedilol 12.5 mg b.i.d.  9. Singulair 10 mg a day.  10.Prednisone taper.  11.Norvasc 10 mg b.i.d.   PHYSICAL EXAMINATION:  She looks younger than her stated age.  She  ambulates around the clinic without respiratory difficulty.  Blood  pressure 128/74, heart rate 58, weight 139 (which is stable).  HEENT:  Normal.  NECK:  Supple.  No JVD.  Carotids are 2+ bilaterally without bruits.  There is no lymphadenopathy or thyromegaly.  CARDIAC:  PMI is not displaced.  Regular rate and rhythm, with a minimal  systolic ejection murmur at the left sternal border.  There is no rub or  gallop.  LUNGS:  Clear.  ABDOMEN:  Soft, nontender, nondistended.  No hepatosplenomegaly, no  bruits, no masses.  Good bowel sounds.  EXTREMITIES:  Warm.  No cyanosis, clubbing or edema.  No rash.  NEUROLOGIC:  Alert and oriented x3.  Cranial nerves II-XII are intact.  Moves all fours without difficulty.  Affect is very pleasant.   EKG:  Shows sinus bradycardia at rate of 58, no ST-T wave  abnormalities.   ASSESSMENT/PLAN:  1. Hypertension.  Blood pressure is well-controlled.  Continue current      therapy.  2. Chest pain.  This is resolved.  Myoview was reassuring.   DISPOSITION:  Will see her back in 6-9 months for a routine follow-up.     Bevelyn Buckles. Bensimhon, MD  Electronically Signed    DRB/MedQ  DD: 08/28/2007  DT: 08/28/2007  Job #: [id number]   cc:   Luanna Cole. Lenord Fellers, M.D.

## 2010-09-08 NOTE — Op Note (Signed)
NAMEALDINE, Amanda Jackson                ACCOUNT NO.:  [id number]   MEDICAL RECORD NO.:  [id number]          PATIENT TYPE:  OIB   LOCATION:  5158                         FACILITY:  [facility]   PHYSICIAN:  Hermelinda Medicus, M.D.   DATE OF BIRTH:  1940-12-31   DATE OF PROCEDURE:  DATE OF DISCHARGE:                               OPERATIVE REPORT   PREOPERATIVE DIAGNOSES:  1. Bilateral ethmoid and maxillary sinusitis.  2. 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  3. Mild septal deviation.   POSTOPERATIVE DIAGNOSES:  1. Bilateral ethmoid and maxillary sinusitis.  2. 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  3. Mild septal deviation.   OPERATION:  Functional endoscopic sinus surgery with bilateral  ethmoidectomies and bilateral maxillary sinus ostial enlargement with  removal of mucocyst, mucous membrane, and mucoid material.   SURGEON:  Hermelinda Medicus, MD   ANESTHESIOLOGIST:  Kaylyn Layer. Michelle Piper, MD   The patient is aware of the risks and gains, as we remove the polyps and  reduce the concha bullosa, we enter the sinuses, she can have  postoperative bleeding, she can have break-in infection around her eyes  or the base of her skull.  She is well aware of theses issues.  She,  however, also has had considerable asthma, which I think would help this  considerably as well as her sinus issues.   PROCEDURE:  The patient was placed in supine position.  Under local MAC  anesthesia using 1% Xylocaine With Epinephrine 5 mL and topical cocaine  200 mg the left side was first approached.  The middle turbinate was  pushed medially in an effort to gain some visibility.  Her inferior  turbinate was outfractured and was able to also gain some nasal airway  as well as some visibility, and then using the 0 and 70-degree scopes,  we entered the ethmoid sinus and found considerable polyps and mucoid  material.  We suctioned this and removed this using these scopes and  using the Lenoria Chime and Blakesley-Wilde forceps upbiting and  straight.  Once this was cleared off its material, we then approached the maxillary  sinus where the natural ostium was essentially blocked by thickened  membrane.  We found this ostium and then we opened this using the  backbiting forceps to approximately 5 times its normal size and then  suctioned  considerable mucus from this left maxillary sinus.  We then  approached the right side where the concha bullosa was obstructing the  normal flow of sinus mucus and this was reduced considerably and also  pushed medially, so that we were able to establish a space for the sinus  to drain.  The sinus then was opened using the Blakesley-Wilde once  again and the upbiting Blakesley-Wilde using the 0 and 70-degree scopes,  and we could see right up into the frontal sinus once this was  completely achieved.  Considerable mucopurulent material was seen as  well as polyps.  The maxillary sinus was then approached and using the  70-degree scope, we were able to see within the sinus once we found the  natural ostium and then  the sidebiting forceps was again used to open  this to 5 times its normal size and then maxillary sinus was opened and  then suctioned off considerable mucoid material.  Once this was  accomplished, the Gelfoam was placed in the ethmoid sinus.  Gelfilm was  placed to hold the turbinates medial  and then Telfa was placed within the nose.  The patient tolerated the  procedure well.  Blood loss was estimated about 20 mL.  Followup will be  overnight because of her general health history and then the patient  will see me in 5 days, 10 days, 3 weeks, 6 weeks, 3 months, and a year.           ______________________________  Hermelinda Medicus, M.D.     JC/MEDQ  D:  08/28/2008  T:  08/29/2008  Job:  [id number]   cc:   Luanna Cole. Lenord Fellers, M.D.  Stephannie Li, M.D.  Dr. Pecola Lawless

## 2010-09-11 NOTE — H&P (Signed)
Amanda Jackson, RALLIS NO.:  [id number]   MEDICAL RECORD NO.:  [id number]          PATIENT TYPE:  EMS   LOCATION:  MAJO                         FACILITY:  [facility]   PHYSICIAN:  Isidor Holts, M.D.  DATE OF BIRTH:  [birth year]   DATE OF ADMISSION:  05/15/2006  DATE OF DISCHARGE:                              HISTORY & PHYSICAL   PRIMARY CARE PHYSICIAN:  Dr. Luanna Cole. [place]   CHIEF COMPLAINT:  Recurrent chest pain.   HISTORY OF PRESENT ILLNESS:  This is a 70 year old female.  For past  medical history, see below.  According to patient, she went to bed late,  on May 14, 2006. She had got home about 9:30 p.m. and subsequently  did some work until early a.m. and as such obtained only about two hours  of sleep.  She had to wake up very early to go off to work and was about  to leave the house at about 4:45 a.m. when she felt a twinge of left  inframammary pain which she described as excruciating, sharp and lasted  only for a few seconds.  She subsequently went to work and felt quite  tired while at work.  She eventually got home and about 11:00 a.m, and  when preparing to go to church, she suffered a second episode of chest  pain in the same location, associated with mild diaphoresis, no nausea,  no vomiting, no dizziness, no shortness of breath.  She took sublingual  Nitroglycerin without any relief, repeated this after five minutes. Her  husband brought her to the emergency department and total duration of  pain was about 15 to 20 minutes.  She had no further medications  administered in the emergency department and at the time of this  evaluation, was pain free.   PAST MEDICAL HISTORY:  1. Hypertension.  2. Osteoarthritis.  3. History of atypical chest pain, status post normal stress      Cardiolite December of 2000.  4. History of adenomatous polyps.  5. 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  6. History of mitral valve prolapse.  7. Bronchial asthma.  8. Recent sinusitis/bronchial asthma flare, December of 2007.   MEDICATION HISTORY:  1. Hyzaar (100/25) one p.o. daily.  2. Cartia XT 300 mg p.o. daily.  3. Lorazepam 0.5 mg p.o. p.r.n. b.i.d.  4. Atenolol 100 mg p.o. daily.  5. Aspirin 81 mg p.o. daily.  6. Hydrocodone/APAP (5/500) one p.r.n.  7. Nitrostat 0.4 mg sublingual p.r.n.  8. Ibuprofen 800 mg p.o. p.r.n.  9. Fa-cyancoba-pyridoxine one pill p.o. daily.   ALLERGIES:  PATIENT HAS A REMOTE HISTORY OF INTOLERANCE TO PREDNISONE  ALTHOUGH SHE STATES THAT IN DECEMBER DURING A FLARE UP OF BRONCHIAL  ASTHMA, SHE WAS ABLE TO TOLERATE LOW DOSES OF PREDNISONE WITHOUT ADVERSE  EFFECTS.   REVIEW OF SYSTEMS:  Essentially as in the HPI and Chief Complaint,  otherwise negative.   SOCIAL HISTORY:  Patient is a retired Programmer, multimedia, she currently  works at  a department store part time, she is married, has one son who  has a heart murmur and hypertension, ex-smoker, used to smoke socially  and quit in 1979, usually has a glass of wine with dinner every evening,  has no history of drug abuse.   FAMILY HISTORY:  Father died at age 6 years old of lung cancer, mother  died at age 69 years old with liver cancer.  Family history is otherwise  noncontributory.   PHYSICAL EXAMINATION:  VITALS:  Temperature 98.6, pulse 80 per minute  and regular, respiratory rate 20, BP 111/62 mmHg, pulse oximetry 98% on  room air.  GENERAL:  Patient does not appear to be in obvious acute distress at the  time of this evaluation.  She is alert, communicative, oriented, not  short of breath at rest.  HEENT:  No clinical pallor, no jaundice, no conjunctival injection.  Throat is clear.  NECK:  Supple.  JVD not seen.  No palpable lymphadenopathy, no palpable  goiter, no carotid bruits.  CHEST:  Clinically clear to auscultation, no wheezes or crackles.  CARDIAC:  Heart sounds 1  and 2 heard, normal, regular, no murmurs.  ABDOMEN:  Full, soft and nontender.  No palpable thyromegaly, no  palpable masses.  Normal bowel sounds.  LOWER EXTREMITY EXAMINATION:  No pitting edema.  Palpable peripheral  pulses.  MUSCULOSKELETAL SYSTEM:  Not formally examined.  CENTRAL NERVOUS SYSTEM:  No focal neurologic deficits on gross  examination.   INVESTIGATIONS:  CBC:  WBC 5.3, hemoglobin 13.0, hematocrit 38.3,  platelets 258.  Electrolytes:  Sodium 140, potassium 3.2, chloride 107,  CO2 27.0, BUN 17, creatinine 0.8, glucose 102.  Troponin I at point of  care less than 0.05.  Urinalysis was negative with WBC 3-6, bacteria  few.   Chest x-ray dated May 15, 2006 shows no acute cardiopulmonary  disease, stable bronchitic changes.   EKG dated May 15, 2006 shows sinus rhythm regular, 60 per minute,  normal axis, low Q wave in V1 and V3, no acute ischemic changes.   ASSESSMENT/PLAN:  1. Recurrent chest pain.  It is important to rule out coronary artery      disease.  Although chest pain here, has some atypical      characteristics, it was relieved by sublingual nitroglycerin and      patient does in deed have risk factors including age and      hypertension.  We shall admit for telemetric monitoring, cycle      cardiac enzymes, do a lipid profile, TSH, continue Aspirin and beta-      blocker and request cardiology consultation for risk      stratification.   1. Hypertension.  This is controlled.  We shall continue pre-admission      medications.   1. History of bronchial asthma.  Patient is currently asymptomatic.      We shall utilize bronchodilator nebulizers p.r.n.   1. Hypokalemia.  According to patient, this is secondary to incomplete      compliance with potassium supplement regimen.  She has been      administered KCL in the emergency department.  We shall monitor      electrolytes.  Further management will depend on clinical course.      Isidor Holts, M.D.  Electronically Signed     CO/MEDQ  D:  05/15/2006  T:  05/15/2006  Job:  [id number]   cc:   Luanna Cole. Lenord Fellers, M.D.

## 2010-09-11 NOTE — Discharge Summary (Signed)
Amanda Jackson, Amanda Jackson                          ACCOUNT NO.:  [id number]   MEDICAL RECORD NO.:  [id number]                   PATIENT TYPE:  INP   LOCATION:  5743                                 FACILITY:  [facility]   PHYSICIAN:  Adolph Pollack, M.D.            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   SECONDARY DIAGNOSES:  1. Hypertension.  2. Mitral valve prolapse.  3. Hypokalemia.  4. Mild postoperative ileus.   PROCEDURE:  Laparoscopic right colectomy.   REASON FOR ADMISSION:  Amanda Jackson is a 70 year old female with a history of  colon polyps.  Colonoscopy by Dr. Lina Sar was performed and a large  cecal polyp was noted, but was not able to be removed completely.  It  demonstrated tubulovillous adenomatous-type changes.  She was admitted for  elective laparoscopic right colectomy.   HOSPITAL COURSE:  She underwent the above procedure.  Postoperatively, she  had some fever.  She was started on incentive-type spirometry.  She had some  right knee swelling which was chronic, but this slowly improved with time.  Her potassium was noted to be 2.9 and with replacement, this normalized.  Pathology came back as above with no evidence of malignancy.  She had a mild  ileus which slowly started to resolve.  Her hypertension was controlled with  her medications.  By her 6th postoperative day, her bowels were moving,  wound was clean and intact, she was tolerating a diet, ambulatory and felt  to be ready for discharge.   DISPOSITION:  Discharged to home, January 31, 2003, in satisfactory  condition.   DISCHARGE INSTRUCTIONS:  She was given discharge instructions and told to  resume her home medications.  Tylox was given for pain and I will see her  back in the office in two to  three weeks.   Of note was that we saw some liver abnormalities which were suspicious for  cirrhosis during her operation.  She is to follow up in one to two months  with Dr. Juanda Chance for evaluation of this.                                                Adolph Pollack, M.D.    Kari Baars  D:  02/14/2003  T:  02/15/2003  Job:  [id number]   cc:   Lina Sar, M.D. [facility]. Lenord Fellers, M.D.  [address] [place]  [place]  [place] [address]  Fax: [phone]   Lyn Records III, M.D.  [address]  [address]  [place] [address]  Fax: [phone]

## 2010-09-11 NOTE — Assessment & Plan Note (Signed)
[facility]                            CARDIOLOGY OFFICE NOTE   Amanda, Jackson                       MRN:          [id number]  DATE:06/07/2006                            DOB:          05/14/1940    PRIMARY CARE PHYSICIAN:  Dr. Luanna Cole. [place].   PATIENT IDENTIFICATION:  Amanda Jackson is a delightful 70 year old woman  with a history of hypertension who was recently admitted for chest pain  and she presents today for a post hospital followup.   PAST MEDICAL HISTORY:  1. Chest pain.      a.     Ruled out for myocardial infarction January 2008.      b.     Exercise Myoview showed an EF of 67% with no evidence of       ischemia or a scar.  2. Hypertension.  3. Osteoarthritis.  4. Status post laparoscopic right colectomy.  5. Reported mitral valve prolapse.  6. Asthma.   CURRENT MEDICATIONS:  1. Cardizem 300 mg a day.  2. Hyzaar 100/25 mg a day.  3. Potassium 20 mEq b.i.d..  4. Atenolol 100 mg a day.  5. Lorazepam 0.5 mg a day.  6. Aspirin 81 mg a day.  7. Multivitamin.  8. Nasonex 2 puffs daily.  9. Asmanex 2 puffs daily.   INTERVAL HISTORY:  Amanda Jackson returns today for a post hospital followup,  she is doing well with no further chest pain.  She remains very active  with her part time job at [employer] without any exertional symptoms.  Denies any heart failure symptoms, no claudication.   PHYSICAL EXAMINATION:  She is well-appearing, she looks much younger  than her stated age.  She ambulates around the clinic briskly without  any respiratory difficulty.  Blood pressure is 150/92, pulse is 52,  weight is 141.  HEENT:  Sclerae anicteric, EOMI, there is no xanthelasmas, mucus  membranes are moist, oropharynx is clear.  Good dentition.  NECK:  Supple, no JVD, carotids are 2+ bilaterally without any bruits.  There is no lymphadenopathy, thyromegaly.  CARDIAC:  She is bradycardic and regular with an S4, no murmur.  LUNGS:  Clear.  ABDOMEN:   Soft, nontender, nondistended, no hepatosplenomegaly, no  bruits, no masses. Good bowel sounds.  EXTREMITIES:  Warm with no cyanosis, clubbing, or edema, no rash.  NEURO:  Alert and oriented x3, cranial nerves II-XII are intact, moves  all 4 extremities without difficulty, affect is very bright.  EKG shows sinus bradycardia at a rate of 52, otherwise normal axis and  intervals.  No ST-T wave changes.   ASSESSMENT/PLAN:  1. Chest pain.  This was quite atypical, her Myoview is reassuring,      continue risk factor modification.  2. Hypertension.  Blood pressure is still significantly elevated.  She      is quite bradycardic, thus we have taken the liberty to stop her      atenolol and switch her over to Coreg 12.5 b.i.d. with hope that      the extra alpha blockade with make a significant impact  on her      blood pressure.   DISPOSITION:  We will see her back in 6 weeks to follow up.     Bevelyn Buckles. Bensimhon, MD  Electronically Signed    DRB/MedQ  DD: 06/07/2006  DT: 06/07/2006  Job #: [id number]   cc:   Luanna Cole. Lenord Fellers, M.D.

## 2010-09-11 NOTE — H&P (Signed)
[facility]  Patient:    Amanda Jackson, Amanda Jackson                       MRN: [id number] Adm. Date:  [id number] Attending:  Kae Heller                         History and Physical  DATE OF BIRTH:  September 28, 1940  CHIEF COMPLAINT:  Chest pain.  HISTORY OF PRESENT ILLNESS:  Ms. Occhipinti is a 70 year old female who has a history of hypertension and atypical chest pain who presents with a one-week history of intermittent chest pain and mild diaphoresis.  Patient reports that symptoms do worsen with exertion.  The pain does respond to sublingual nitroglycerin which she has had at home.  Her symptoms became significantly worse today and she has had to come to the emergency room.  Of note, she has normal Cardiolite stress test done on April 02, 1999 - about 14 months ago - by Dr. Garnette Scheuermann.  REVIEW OF SYSTEMS:  She denies any palpitations, no shortness of breath, no chronic coughing nor orthopnea.  No nausea, vomiting, diarrhea.  No change of bowels, no blood in stool.  No urinary symptoms.  No new musculoskeletal symptoms, no skin rashes of note.  No weight gain or weight loss.  No night sweats, fevers, chills.  No change in vision or hearing.  CURRENT MEDICATIONS:  1. Diltiazem CD 300 mg p.o. q.d.  2. Hyzaar 100/25 p.o. q.d.  3. Atenolol 100 mg p.o. q.d.  4. KCl 40 mEq p.o. q.d.  5. Ativan 0.5 mg two to three weekly.  6. Premarin 0.625 p.o. q.d.  7. Zantac 150 p.o. b.i.d.  8. Aspirin 81 mg p.o. q.d.  9. Tylenol p.r.n. 10. Naprelan p.r.n.  ALLERGIES:  PREDNISONE.  PAST MEDICAL HISTORY:  1. Hypertension.  2. Atypical chest pain with normal stress Cardiolite April 02, 1999.  3. Arthritis.  4. History of adenomatous polyps.  SOCIAL HISTORY:  Patient is a retired Programmer, multimedia.  She is divorced. She has one son.  She does not smoke.  Has a glass of wine with dinner each evening.  FAMILY HISTORY:  Father died at 95 of lung  cancer, mother died at 27 of liver cancer, and she is estranged from her sister.  PHYSICAL EXAMINATION:  VITAL SIGNS:  Blood pressure 139/78, pulse 85, respiratory rate 12.  She is afebrile.  HEENT:  Tympanic membranes are normal.  Pupils are equal, round and reactive to light.  Extraocular movements are intact.  Nasal mucosa and oral mucosa within normal limits.  NECK:  Supple, no adenopathy, no JVD, no bruits.  LUNGS:  Clear to auscultation bilaterally.  HEART:  Regular rate and rhythm without murmurs, rubs, or gallops.  ABDOMEN:  Soft, nontender, nondistended.  Positive bowel sounds, no hepatosplenomegaly.  EXTREMITIES:  No clubbing, cyanosis, or edema.  SKIN:  Intact.  RECTAL:  Heme negative brown stool.  NEUROLOGIC:  Cranial nerves 2-12 intact.  LABORATORY:  CK 158, troponin less than 0.1.  CBC shows white count 5.7, hemoglobin 12.1, platelet count 292.  Basic metabolic panel within normal limits.  Chest x-ray within normal limits.  EKG shows normal sinus rhythm, nonspecific changes, no evidence of ischemia or injury.  ASSESSMENT AND PLAN: 37. A 70 year old female with history of atypical chest pain who presents today    with typical anginal symptoms.  She has relief  with sublingual    nitroglycerin.  I will rule her out for myocardial infarction on a    monitored bed.  We will discuss with cardiology whether or not to    proceed with any further diagnostic workup in the morning, assuming she    rules out. 2. Hypertension.  Seems well controlled at present.  Continue to monitor as    inpatient. DD:  06/09/00 TD:  06/10/00 Job: [id number]

## 2010-09-11 NOTE — Consult Note (Signed)
NAMEGRACIA, Jackson                ACCOUNT NO.:  [id number]   MEDICAL RECORD NO.:  [id number]          PATIENT TYPE:  INP   LOCATION:  1829                         FACILITY:  [facility]   PHYSICIAN:  Bevelyn Buckles. Bensimhon, MDDATE OF BIRTH:  November 29, 1940   DATE OF CONSULTATION:  05/15/2006  DATE OF DISCHARGE:                                 CONSULTATION   PRIMARY CARE PHYSICIAN:  Dr. Lenord Fellers.   CARDIOLOGIST:  She is new to [facility].   REASON FOR CONSULTATION:  Chest pain.   HISTORY OF PRESENT ILLNESS:  [name] is a delightful 70 year old woman  with a history of hypertension, asthma and colonic polyps status post  partial colon resection. She has a several-year history of atypical  chest pain which occurs several times a year. She saw Dr. Garnette Scheuermann in  2001 and underwent a Cardiolite which was negative. She says that there  is really no rhyme or reason to her chest pain but does feel that it is  perhaps worse with stress. She often takes nitroglycerin and it goes  away. She is quite active in her job and does not have any reproducible  exertional pain. This morning while going in to work at about  5 o'clock she had a very brief episode of left-sided pain underneath her  breast, this lasted less than a minute and resolved spontaneously. She  was able to go to work and do a full inventory without any problems. She  came home and was preparing to go to church when she reached up in her  closet to try to get a pair of shoes and she had severe stabbing pain  down her left side and through her left breast. There was some mild  diaphoresis but no nausea, vomiting, palpitations or other problems. She  took nitroglycerin x2 and feels it may have gotten some better though  pain persisted. When she got to the ER it resolved spontaneously.  An  EKG was obtained which was normal.  First set of cardiac markers were  also normal. Her potassium was found to be low and she says she often  gets  some chest pain when her potassium is low. This was supplemented in  the emergency room.   REVIEW OF SYSTEMS:  She does have some reflux disease as well as knee  pain. She denies any orthopnea, PND, other symptoms of heart failure.  She has not had a cough. She had not had fevers or chills. No bright red  blood per rectum, no melena, no claudication. Remainder of review of  systems is negative except for HPI.   PROBLEM LIST:  1. Atypical chest pain, intermittent.      a.     Negative Cardiolite in 2001.  2. Hypertension, controlled.  3. Asthma recently diagnosed.  4. Hyperkalemia.  5. Colon polyps status post partial colon resection.  6. Mitral valve prolapse.   CURRENT MEDICATIONS:  1. Hyzaar 100/25 daily.  2. Cardia XT 300 daily.  3. Lorazepam 0.5 mg p.r.n. b.i.d.  4. Atenolol 100 mg daily.  5. Aspirin 81 daily.  6. Hydrocodone/APAP 5/500 p.r.n.  7. Nitroglycerin 0.4 mg p.r.n.  8. Ibuprofen p.r.n.  9. B vitamins daily.   ALLERGIES:  REPORTS A REMOTE INTOLERANCE TO PREDNISONE.   SOCIAL HISTORY:  She lives in [place]. She is divorced. She does have  a significant other. She is a retired Programmer, multimedia. She currently  works part time at [employer] at [employer].  She has a grown son.  Tobacco:  History of smoking, quit in 1979. Also alcohol is social only.   FAMILY HISTORY:  Father died at 68 due to lung cancer and COPD;  extensive smoking history, no heart disease. Mother died at 84 due to  liver cancer, no heart disease. On sister is 78 years old, alive and she  has a history of colon cancer, no heart disease.   PHYSICAL EXAMINATION:  GENERAL: She is a very pleasant woman sitting up  in bed in no acute distress.  VITAL SIGNS: Respirations are unlabored. Blood pressure is 111/62, heart  rate is in the 60's, she is saturating 98% on room air. She is afebrile  at 98.6.  HEENT: Sclerae are anicteric. EOMI. There is no xanthelasma. Mucous  membranes are moist. Oropharynx is  clear.  NECK: Supple, no JVD. Carotids are 2+ bilaterally without any bruits.  There is no lymphadenopathy or thyromegaly.  CARDIAC: She has a regular rate and rhythm, no murmurs, rubs, or  gallops.  LUNGS: Clear. There is no pain on palpation of her chest wall or  shoulder. There is no lymphadenopathy.  ABDOMEN: Soft, nontender, nondistended, no hepatosplenomegaly, no  bruits, no masses, good bowel sounds.  EXTREMITIES: Warm. There is no cyanosis, clubbing or edema. No rashes.  Dorsalis pedis pulses are 2+ bilaterally.  NEURO: She is alert and oriented x3. Cranial nerves II-XII are intact.  Moves all four extremities without difficulty. A very pleasant affect.   LABORATORY DATA:  Are notable for sodium 140, potassium 3.2, chloride of  107, bicarb 27, BUN is 17, creatinine 0.8, glucose 102. Point of care  markers are negative with a troponin of less than 0.05. White count 5.3  thousand, hemoglobin 13, platelets of 258,000. Chest x-ray shows no  acute cardiopulmonary process.  EKG shows normal sinus rhythm at a rate of 60 with no ST, T wave  abnormalities. Axis and intervals are all normal.   ASSESSMENT:  1. Atypical chest pain, recurrent.      a.     Negative Cardiolite in 2001.  2. Hypertension, well controlled.  3. Asthma.  4. Hyperkalemia.  5. Gastroesophageal reflux disease.  6. History of colon polyps status post resection.   PLAN/DISCUSSION:  Her chest pain is quite atypical despite the partial  response to nitroglycerin. There are no objective signs of ischemia on  EKG or by cardiac markers. I had a long talk with her about the risk,  benefits of cardiac catheterization versus stress-testing and she  prefers stress-testing which I think is quite reasonable.  Will keep her  overnight to rule out myocardial infarction by serial cardiac markers.  If enzymes are negative she can go with an outpatient stress-Myoview, if positive we would favor  catheterization. At this point  I do not think there is any need for  heparin as my suspicion for an acute coronary syndrome is low.   We appreciate the consult and we will follow with you.      Bevelyn Buckles. Bensimhon, MD  Electronically Signed     DRB/MEDQ  D:  05/15/2006  T:  05/15/2006  Job:  [id number]   cc:   Luanna Cole. Lenord Fellers, M.D.

## 2010-09-11 NOTE — Discharge Summary (Signed)
NAMEJALIE, EILAND                ACCOUNT NO.:  [id number]   MEDICAL RECORD NO.:  [id number]          PATIENT TYPE:  OBV   LOCATION:  3707                         FACILITY:  [facility]   PHYSICIAN:  Lonia Blood, M.D.       DATE OF BIRTH:  09-07-1940   DATE OF ADMISSION:  05/15/2006  DATE OF DISCHARGE:  05/16/2006                               DISCHARGE SUMMARY   PRIMARY CARE PHYSICIAN:  Dr. Lenord Fellers.   DISCHARGE DIAGNOSIS:  1. Chest pain.  The patient ruled out for myocardial infarction.  2. Hypertension.  3. Osteoarthritis.  4. Status post laparoscopic right colectomy.  5. Mitral valve prolapse.  6. Asthma.   DISCHARGE MEDICATIONS:  1. Potassium 40 mEq daily.  2. Claritin 10 mg daily.  3. Proventil daily.  4. Nasonex daily.  5. Asmanex daily.  6. Centrum daily.  7. Aspirin 81 mg daily.  8. Hyzaar 100/25 mg daily.  9. Prilosec OTC 20 mg daily.   The patient was instructed to hold Cartia and atenolol, until after the  stress test.   CONDITION ON DISCHARGE:  Mrs. Ehler was discharged in good condition.  At  the time of discharge, she was set up to follow up with Dr.  Gala Romney at the [facility] cardiology group on Tuesday, January 22 at 8:45  a.m. for a stress test.   PROCEDURES DURING THIS ADMISSION:  On May 15, 2006, the patient  underwent a chest x-ray with no acute findings.   CONSULTATIONS:  The patient was seen in consultation by Dr. Gala Romney  from [facility] cardiology.  For admission history and physical, please  refer to the dictated H&P done by Dr. Brien Few May 15, 2006.   HOSPITAL COURSE:  1. Chest pain.  Mrs. Castelli was admitted to [facility] with      complaints of recurrent chest pain.  The patient was placed on 22-      hour observation telemetry to rule out a myocardial infarction.      The patient was further restratified with a fasting lipid panel      which showed a LDL of 81.  The patient was treated with aspirin,      beta blocker, and she  improved significantly.  By hospital day #2,      the patient was without any chest pain, and Dr. Gala Romney from      cardiology felt that the patient could be followed in the      outpatient setting with a stress test.  2. Dysuria.  Upon admission, Mrs. Koeneman was complaining of dysuria,      and a urine culture was obtained which showed 50,000 colonies of      Enterococcus.  The patient will follow up with her primary care      physician for treatment of this infection, if symptoms are      persistent.  Of note, the Enterococcus was pan sensitive.  3. History of asthma.  Mrs. Wilczak did not have any symptoms related to      her asthmatic attack, and she was continued on her  chronic      medications without any changes during this hospitalization.      Lonia Blood, M.D.  Electronically Signed     SL/MEDQ  D:  05/20/2006  T:  05/20/2006  Job:  [id number]   cc:   Luanna Cole. Lenord Fellers, M.D.  Bevelyn Buckles. Bensimhon, MD
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
## 2010-09-11 NOTE — Assessment & Plan Note (Signed)
[name]                            CARDIOLOGY OFFICE NOTE   Amanda Jackson, Amanda Jackson                       MRN:          [id number]  DATE:07/21/2006                            DOB:          [birth year]    PRIMARY CARE PHYSICIAN:  Dr. Luanna Cole. [place].   INTERVAL HISTORY:  Amanda Jackson is a delightful 70 year old woman with a  history of hypertension and atypical chest pain, with a recent Myoview  that showed an EF of 67% and no ischemia.  Presents today for routine  followup.   Overall, she says, she is doing well.  She continues to have occasional  episodes of chest pain, about twice a week.  She describes this as a  sharp, pinching pain up under her left breast.  It can come on at  anytime and is very transient.  It is nonexertional.  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  She has not had any orthopnea or PND.  She is taking all her blood pressure medications.  She was recently told  by her primary care physician that she has asthma.  She denies any  current wheezing.   CURRENT MEDICATIONS:  1. Cardizem 300 a day.  2. Hyzaar 100/25.  3. Potassium 20 b.i.d.  4. Ativan 0.5 b.i.d.  5. Aspirin 81 a day.  6. Claritin 10 mg a day.  7. Nasonex.  8. Asmanex.  9. Carvedilol 12.5 b.i.d.  10.Centrum multivitamin.   PHYSICAL EXAMINATION:  She is well-appearing, no acute distress,  ambulates around the clinic without any respiratory difficulty.  Blood  pressure is 160/78, heart rate is 59.  HEENT:  Sclerae anicteric, EOMI, there is no xanthelasmas, mucus  membranes are moist. Oropharynx is clear.  NECK:  Supple, no JVD, carotids are 2+ bilaterally without bruits, there  is no lymphadenopathy or thyromegaly.  CARDIAC:  Regular and bradycardic with a soft systolic ejection murmur  at the right sternal border.  LUNGS:  Clear, there are no wheezes or rales.  ABDOMEN:   Soft, nontender, nondistended, no hepatosplenomegaly, no  bruits, no masses.  EXTREMITIES:  Warm with no cyanosis, clubbing, or edema, no rash.  NEURO:  She is alert and oriented x3, cranial nerves II through XII are  intact, moves all 4 extremities without difficulty.   ASSESSMENT/PLAN:  1. Chest pain.  This is quite atypical.  Her Myoview is reassuring.      We did discuss the possibility of cardiac catheterization if she      would remain concerned to know more about her chest pain, but she      feels reassured with her stress test, which I think is very      reasonable.  2. Hypertension remains poorly controlled.  We will switch her      Cardizem over to Norvasc 10 and hopefully make some more room for      her to go up on her Coreg, which we will do in the next couple of  weeks.  I suspect she will also need clonidine.  3. History of questionable mitral valve prolapse.  We will get an      echocardiogram to further evaluate this as well her mild exertional      dyspnea.     Amanda Buckles. Bensimhon, MD  Electronically Signed    DRB/MedQ  DD: 07/21/2006  DT: 07/21/2006  Job #: [id number]   cc:   Luanna Cole. Lenord Fellers, M.D.

## 2010-09-11 NOTE — Op Note (Signed)
NAME:  Amanda Jackson, Amanda Jackson                          ACCOUNT NO.:  [id number]   MEDICAL RECORD NO.:  [id number]                   PATIENT TYPE:  INP   LOCATION:  2550                                 FACILITY:  [facility]   PHYSICIAN:  Adolph Pollack, M.D.            DATE OF BIRTH:  [birth year]   DATE OF PROCEDURE:  01/25/2003  DATE OF DISCHARGE:                                 OPERATIVE REPORT   PREOPERATIVE DIAGNOSIS:  Tubulovillous adenoma of the cecum.   POSTOPERATIVE DIAGNOSIS:  Tubulovillous adenoma of the cecum.   PROCEDURE:  Laparoscopic right colectomy.   SURGEON:  Adolph Pollack, M.D.   ASSISTANT:  Sheppard Plumber. Earlene Plater, M.D.   ANESTHESIA:  General.   INDICATIONS:  Amanda Jackson is a 70 year old female with a history of colonic  polyps.  A recent colonoscopy by Dr. Juanda Chance demonstrated a large polypoid  mass in the cecal area.  A biopsy was performed but was not able to be  removed colonoscopically.  Biopsy showed a tubulovillous adenoma in one  section of it.  She now presents for partial colectomy.  The procedure and  the risks have been explained to her extensively preoperatively.   TECHNIQUE:  She was seen in the holding area, then brought to the operating  room and placed supine on the operating table and a general anesthetic was  administered.  Her abdominal wall was sterilely prepped and draped after a  Foley catheter was placed.  A small incision was made in the upper  epigastric region, incising the midline skin and subcutaneous tissue and  fascia sharply until the peritoneal cavity was entered.  A pursestring  suture of 0 Vicryl was placed around the fascial edges.  A Hasson trocar was  introduced into the peritoneal cavity and a pneumoperitoneum created by  insufflation of CO2 gas.  Next a laparoscope was introduced.  I initially  visualized the liver, which looks like it was cystic and had somewhat of a  nodular appearance as well.  I could not tell if this was  cirrhotic-  appearing or if this neoplastic area turned out to be malignant if these  nodules might be metastatic disease.  My highest suspicion at this time  would be some mild cirrhosis.  I then, under direct vision, inserted a 10 mm  trocar in the left lateral abdomen and one in the lower midline.  I  proceeded with a right colectomy at this time.  I grasped the cecum and  mobilized it and the ascending colon medially by dividing the lateral  peritoneal attachments.  The area of the ureter was identified, and I kept  the plane of dissection anterior to this.  I then mobilized the hepatic  flexure using the Harmonic scalpel and mobilized the proximal portion of the  transverse colon until I could displace the proximal transverse colon down  into the pelvis region as well as the  right colon and the distal ileum.  Once I felt I had adequate mobilization I then made a small extraction  incision in the lower midline.  I placed a wound protector in.  I then  removed the distal ileum, cecum, and proximal transverse colon through this  small wound.  I divided the ileum and the proximal transverse colon with a  GIA stapler.  I then divided mesenteric vessels between clamps and ligated  them and handed the specimen off the field.  I made sure to get a wide wedge  of mesentery.  There were some notable lymph nodes in the mesentery.   I went to the back table and opened up the specimen and noted it was a  fairly large, at least 3 cm, possibly 4 cm, polypoid mass in the cecal area  just above the ileocecal valve.   A side-to-side anastomosis was then performed with the stapler with the  remaining enterotomy closed with the linear noncutting stapler.  The  anastomosis was patent, viable, and under no tension.  The distalmost staple  was reinforced with a 3-0 Vicryl suture and the anastomosis was dropped back  into the abdominal cavity and gloves were changed.   Next I irrigated out the abdominal  cavity with warm saline solution.  No  obvious bleeding was noted.  I then closed the fascia with a running #1 PDS  suture.  I reinsufflated the abdomen and inspected the area, and the area  was fairly dry without any evidence of bleeding.  I then removed the  trocars, released the pneumoperitoneum, and closed the epigastric fascial  defect by tightening up and tying down the pursestring suture.  The  irrigation was performed of the subcutaneous tissues and the skin of the  incisions was closed with staples.  Sterile dressings were then applied.   She tolerated the procedure without any apparent complications.  She  subsequently was taken to the recovery room in satisfactory condition.                                               Adolph Pollack, M.D.    Kari Baars  D:  01/25/2003  T:  01/26/2003  Job:  [id number]   cc:   Lina Sar, M.D. [facility]. Lenord Fellers, M.D.  [address] [place]  [place]  [place] [address]  Fax: [phone]   Lyn Records III, M.D.  [address]  [address]  [place]  [place] [address]  Fax: [phone]

## 2010-09-17 ENCOUNTER — Other Ambulatory Visit: Payer: Self-pay | Admitting: Internal Medicine

## 2010-09-18 ENCOUNTER — Other Ambulatory Visit: Payer: Self-pay | Admitting: *Deleted

## 2010-09-18 MED ORDER — CARVEDILOL 12.5 MG PO TABS: 12.5000 mg | ORAL_TABLET | Freq: Two times a day (BID) | ORAL | Status: DC

## 2010-10-08 ENCOUNTER — Encounter: Payer: Self-pay | Admitting: Internal Medicine

## 2010-10-08 ENCOUNTER — Ambulatory Visit (INDEPENDENT_AMBULATORY_CARE_PROVIDER_SITE_OTHER): Payer: Medicare Other | Admitting: Internal Medicine

## 2010-10-08 VITALS — BP 142/84 | HR 68 | Temp 97.3°F | Ht 63.0 in | Wt 138.5 lb

## 2010-10-08 DIAGNOSIS — F411 Generalized anxiety disorder: Secondary | ICD-10-CM

## 2010-10-08 DIAGNOSIS — I10 Essential (primary) hypertension: Secondary | ICD-10-CM

## 2010-10-08 DIAGNOSIS — F419 Anxiety disorder, unspecified: Secondary | ICD-10-CM

## 2010-10-08 LAB — BASIC METABOLIC PANEL
BUN: 18 mg/dL (ref 6–23)
CO2: 26 mEq/L (ref 19–32)
Chloride: 107 mEq/L (ref 96–112)
Creat: 0.65 mg/dL (ref 0.50–1.10)
Glucose, Bld: 106 mg/dL — ABNORMAL HIGH (ref 70–99)
Potassium: 4 mEq/L (ref 3.5–5.3)

## 2010-10-08 MED ORDER — AMLODIPINE BESYLATE 10 MG PO TABS: 10.0000 mg | ORAL_TABLET | Freq: Every day | ORAL | Status: DC

## 2010-10-08 NOTE — Patient Instructions (Signed)
Continue to take meds as directed. See me in 6 months for checkup

## 2010-10-08 NOTE — Progress Notes (Signed)
  Subjective:    Patient ID: Amanda Jackson, female    DOB: 07-Mar-1941, 70 y.o.   MRN: [id number]  HPI 6 month recheck on anxiety and HTN. Has osteoarthritis left knee and knee replacement has been recommended by orthopedist. Allergies stable on meds. Hx of back pain treated with prn Flexeril and Ibuprofen. On omeprazole for GE reflux. Uses Proventil on a regular basis.       Review of Systems     Objective:   Physical ExamChest is clear , Cor:RRR- nl S1/S2 without murmur. Ext without edema.        Assessment & Plan:  HTN Anxiety Osteoarthritis of left knee Back pain Allergic Rhinitis GE Reflux Refill meds for 6 months.

## 2010-10-09 ENCOUNTER — Encounter: Payer: Self-pay | Admitting: Internal Medicine

## 2010-10-15 ENCOUNTER — Other Ambulatory Visit: Payer: Self-pay | Admitting: *Deleted

## 2010-10-15 DIAGNOSIS — I10 Essential (primary) hypertension: Secondary | ICD-10-CM

## 2010-10-15 MED ORDER — AMLODIPINE BESYLATE 10 MG PO TABS: 10.0000 mg | ORAL_TABLET | Freq: Every day | ORAL | Status: DC

## 2010-10-15 NOTE — Telephone Encounter (Signed)
Pt called upset that she has not gotten her amlodipine refilled, apologized and sent in new rx

## 2010-10-18 ENCOUNTER — Other Ambulatory Visit: Payer: Self-pay | Admitting: Internal Medicine

## 2010-10-18 ENCOUNTER — Other Ambulatory Visit: Payer: Self-pay | Admitting: *Deleted

## 2010-10-20 ENCOUNTER — Other Ambulatory Visit: Payer: Self-pay | Admitting: Internal Medicine

## 2010-10-27 ENCOUNTER — Other Ambulatory Visit: Payer: Self-pay | Admitting: *Deleted

## 2010-10-27 MED ORDER — LORAZEPAM 0.5 MG PO TABS: 0.5000 mg | ORAL_TABLET | Freq: Two times a day (BID) | ORAL | Status: DC | PRN

## 2010-12-11 ENCOUNTER — Other Ambulatory Visit: Payer: Self-pay | Admitting: Internal Medicine

## 2010-12-17 ENCOUNTER — Other Ambulatory Visit: Payer: Self-pay | Admitting: *Deleted

## 2010-12-17 DIAGNOSIS — I10 Essential (primary) hypertension: Secondary | ICD-10-CM

## 2010-12-17 MED ORDER — AMLODIPINE BESYLATE 10 MG PO TABS: 10.0000 mg | ORAL_TABLET | Freq: Two times a day (BID) | ORAL | Status: DC

## 2011-02-08 ENCOUNTER — Encounter: Payer: Self-pay | Admitting: Internal Medicine

## 2011-04-12 ENCOUNTER — Encounter: Payer: Self-pay | Admitting: Internal Medicine

## 2011-04-12 ENCOUNTER — Ambulatory Visit (INDEPENDENT_AMBULATORY_CARE_PROVIDER_SITE_OTHER): Payer: Medicare Other | Admitting: Internal Medicine

## 2011-04-12 DIAGNOSIS — J309 Allergic rhinitis, unspecified: Secondary | ICD-10-CM | POA: Insufficient documentation

## 2011-04-12 DIAGNOSIS — J45909 Unspecified asthma, uncomplicated: Secondary | ICD-10-CM | POA: Insufficient documentation

## 2011-04-12 DIAGNOSIS — Z0000 Encounter for general adult medical examination without abnormal findings: Secondary | ICD-10-CM

## 2011-04-12 DIAGNOSIS — Z8679 Personal history of other diseases of the circulatory system: Secondary | ICD-10-CM

## 2011-04-12 DIAGNOSIS — E559 Vitamin D deficiency, unspecified: Secondary | ICD-10-CM

## 2011-04-12 DIAGNOSIS — K7689 Other specified diseases of liver: Secondary | ICD-10-CM | POA: Insufficient documentation

## 2011-04-12 DIAGNOSIS — E785 Hyperlipidemia, unspecified: Secondary | ICD-10-CM

## 2011-04-12 DIAGNOSIS — Z86010 Personal history of colonic polyps: Secondary | ICD-10-CM | POA: Insufficient documentation

## 2011-04-12 DIAGNOSIS — F419 Anxiety disorder, unspecified: Secondary | ICD-10-CM

## 2011-04-12 DIAGNOSIS — I10 Essential (primary) hypertension: Secondary | ICD-10-CM

## 2011-04-12 LAB — CBC WITH DIFFERENTIAL/PLATELET
Hemoglobin: 12.3 g/dL (ref 12.0–15.0)
Lymphocytes Relative: 36 % (ref 12–46)
Lymphs Abs: 1.7 10*3/uL (ref 0.7–4.0)
MCH: 30.9 pg (ref 26.0–34.0)
MCV: 91.2 fL (ref 78.0–100.0)
Monocytes Relative: 12 % (ref 3–12)
Neutrophils Relative %: 47 % (ref 43–77)
Platelets: 260 10*3/uL (ref 150–400)
RBC: 3.98 MIL/uL (ref 3.87–5.11)
WBC: 4.7 10*3/uL (ref 4.0–10.5)

## 2011-04-12 LAB — COMPREHENSIVE METABOLIC PANEL
Albumin: 4.2 g/dL (ref 3.5–5.2)
Alkaline Phosphatase: 62 U/L (ref 39–117)
BUN: 16 mg/dL (ref 6–23)
CO2: 28 mEq/L (ref 19–32)
Glucose, Bld: 108 mg/dL — ABNORMAL HIGH (ref 70–99)
Sodium: 143 mEq/L (ref 135–145)
Total Bilirubin: 0.5 mg/dL (ref 0.3–1.2)
Total Protein: 6.8 g/dL (ref 6.0–8.3)

## 2011-04-12 LAB — TSH: TSH: 1.156 u[IU]/mL (ref 0.350–4.500)

## 2011-04-12 LAB — POCT URINALYSIS DIPSTICK
Blood, UA: NEGATIVE
Ketones, UA: NEGATIVE
Protein, UA: NEGATIVE
Spec Grav, UA: 1.01
Urobilinogen, UA: NEGATIVE

## 2011-04-12 LAB — LIPID PANEL
HDL: 60 mg/dL (ref >39)
Total CHOL/HDL Ratio: 2.9 Ratio
VLDL: 23 mg/dL (ref 0–40)

## 2011-04-12 MED ORDER — FA-PYRIDOXINE-CYANOCOBALAMIN 2.5-25-2 MG PO TABS: 1.0000 | ORAL_TABLET | Freq: Every day | ORAL | Status: DC

## 2011-04-12 NOTE — Progress Notes (Signed)
Addended by: Judy Pimple on: 04/12/2011 04:55 PM   Modules accepted: Orders

## 2011-04-12 NOTE — Patient Instructions (Signed)
Continue same medications as previously prescribed. You have been given prescription to get Zostavax vaccine at drugstore. We can refill your medications for 6 months when drugstore calls. Return in 6 months for six-month recheck. Will need be met at that time.
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
## 2011-04-13 ENCOUNTER — Other Ambulatory Visit: Payer: Self-pay | Admitting: Internal Medicine

## 2011-04-13 LAB — VITAMIN D 25 HYDROXY (VIT D DEFICIENCY, FRACTURES): Vit D, 25-Hydroxy: 42 ng/mL (ref 30–89)

## 2011-04-19 ENCOUNTER — Other Ambulatory Visit: Payer: Self-pay

## 2011-04-19 MED ORDER — LORAZEPAM 0.5 MG PO TABS: 0.5000 mg | ORAL_TABLET | Freq: Two times a day (BID) | ORAL | Status: DC | PRN

## 2011-04-26 ENCOUNTER — Other Ambulatory Visit: Payer: Medicare Other | Admitting: Internal Medicine

## 2011-04-29 ENCOUNTER — Other Ambulatory Visit: Payer: Medicare Other | Admitting: Internal Medicine

## 2011-04-29 DIAGNOSIS — E876 Hypokalemia: Secondary | ICD-10-CM

## 2011-04-30 ENCOUNTER — Telehealth: Payer: Self-pay | Admitting: Internal Medicine

## 2011-04-30 NOTE — Progress Notes (Signed)
Left message to call re lab results

## 2011-04-30 NOTE — Telephone Encounter (Signed)
New Problem:    Patient called wanting to see if she was still eligible to see Dr. Gala Romney and if he would refer her to a PCP. She would really like to speak with him because she is having issues with her current PCP. Please advise.

## 2011-04-30 NOTE — Telephone Encounter (Signed)
Pt is insisting that she speak with Dr Gala Romney regarding a new pcp.   She also wants to know if she will stay with Dr Gala Romney or establish with a new cardiologist.

## 2011-04-30 NOTE — Telephone Encounter (Signed)
Pt states that she doesn't need a phone call from Dr Gala Romney if he can work her in on his schedule in January to discuss this.  She is aware that Dr Gala Romney is out of the office this week.

## 2011-05-02 NOTE — Telephone Encounter (Signed)
i can discuss PCP with her. She has done well and really doesn't need routine cardiology f/u at this point. However we can reassign if she needs.

## 2011-05-03 NOTE — Telephone Encounter (Signed)
Pt was notified and will call back if further questions.

## 2011-05-04 NOTE — Progress Notes (Signed)
Patient taking K Dur bid as directed, per Dr. Lenord Fellers, will return in 1 month for potassium rcheck.

## 2011-05-31 ENCOUNTER — Other Ambulatory Visit: Payer: Medicare Other | Admitting: Internal Medicine

## 2011-05-31 DIAGNOSIS — E876 Hypokalemia: Secondary | ICD-10-CM

## 2011-06-03 ENCOUNTER — Telehealth: Payer: Self-pay

## 2011-06-03 MED ORDER — POTASSIUM CHLORIDE CRYS ER 20 MEQ PO TBCR: 20.0000 meq | EXTENDED_RELEASE_TABLET | Freq: Three times a day (TID) | ORAL | Status: DC

## 2011-06-03 NOTE — Telephone Encounter (Signed)
Amanda Jackson is taking KDur 20 meq bid and has been for months

## 2011-06-03 NOTE — Telephone Encounter (Signed)
Increase to tid and recheck K in one month please.

## 2011-06-03 NOTE — Progress Notes (Signed)
Ms Helbing is taking K Dur 20 meq bid and has been for awhile

## 2011-06-23 ENCOUNTER — Other Ambulatory Visit: Payer: Self-pay | Admitting: Internal Medicine

## 2011-06-29 ENCOUNTER — Other Ambulatory Visit: Payer: Medicare Other | Admitting: Internal Medicine

## 2011-06-29 DIAGNOSIS — E876 Hypokalemia: Secondary | ICD-10-CM

## 2011-07-20 ENCOUNTER — Ambulatory Visit (INDEPENDENT_AMBULATORY_CARE_PROVIDER_SITE_OTHER): Payer: Medicare Other | Admitting: Internal Medicine

## 2011-07-20 ENCOUNTER — Encounter: Payer: Self-pay | Admitting: Internal Medicine

## 2011-07-20 VITALS — BP 128/78 | HR 70 | Ht 61.0 in | Wt 139.8 lb

## 2011-07-20 DIAGNOSIS — E785 Hyperlipidemia, unspecified: Secondary | ICD-10-CM | POA: Insufficient documentation

## 2011-07-20 DIAGNOSIS — I10 Essential (primary) hypertension: Secondary | ICD-10-CM

## 2011-07-20 NOTE — Assessment & Plan Note (Signed)
Blood pressure well controlled. Continue current regimen. Agree with KCL supplementation.

## 2011-07-20 NOTE — Progress Notes (Signed)
HPI:  Amanda Jackson is a delightful 71 year old woman history of hypertension and atypical chest pain with a normal Myoview in 2008.  She also has seasonal allergies.  She presents today for routine followup.  She is doing very well.  She continues to work at [employer] and be quite active. 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  She is still under a lot of stress due to her son. She denies any orthopnea, no PND, no lower extremity edema.  She has been compliant with all her medications.BP has been well controlled. Dr. Lenord Fellers following cholesterol. Potassium was low a last visit an Kcl increased - now 3.8  Lab Results  Component Value Date   CHOL 176 04/12/2011   HDL 60 04/12/2011   LDLCALC 93 04/12/2011   TRIG 116 04/12/2011   CHOLHDL 2.9 04/12/2011     ROS: All systems negative except as listed in HPI, PMH and Problem List.  Past Medical History  Diagnosis Date  . Hyperlipidemia     followed by Dr. Lenord Fellers  . Hypertension   . Atypical chest pain     Normal Myoview 2008  . Seasonal allergies   . Hx of colonoscopy 03/18/09  . Anxiety   . MVP (mitral valve prolapse)   . Asthma   . Tubulovillous adenoma     Current Outpatient Prescriptions  Medication Sig Dispense Refill  . albuterol (PROVENTIL HFA) 108 (90 BASE) MCG/ACT inhaler as needed.        Marland Kitchen amLODipine (NORVASC) 10 MG tablet Take 1 tablet (10 mg total) by mouth 2 (two) times daily.  60 tablet  6  . aspirin 81 MG EC tablet Take 81 mg by mouth daily.        . Calcium Carb-Cholecalciferol (CALCIUM 600+D3 PO) Take by mouth 2 (two) times daily.      . carvedilol (COREG) 12.5 MG tablet Take 1 tablet (12.5 mg total) by mouth 2 (two) times daily.  60 tablet  12  . estradiol (ESTRACE) 0.1 MG/GM vaginal cream Place 2 g vaginally 2 (two) times a week.        . FOLBIC 2.5-25-2 MG TABS TAKE 1 TABLET EVERY DAY  30 tablet  10  . guaiFENesin (MUCINEX) 600 MG 12 hr tablet Take 600 mg  by mouth daily.        Marland Kitchen ibuprofen (ADVIL,MOTRIN) 800 MG tablet TAKE 1 TABLET BY MOUTH THREE TIMES A DAY AS NEEDED  90 tablet  8  . LORazepam (ATIVAN) 0.5 MG tablet Take 1 tablet (0.5 mg total) by mouth 2 (two) times daily as needed.  60 tablet  5  . losartan-hydrochlorothiazide (HYZAAR) 100-25 MG per tablet TAKE 1 TABLET EVERY DAY  30 tablet  PRN  . mometasone (ASMANEX 60 METERED DOSES) 220 MCG/INH inhaler Inhale 2 puffs into the lungs daily.        . mometasone (NASONEX) 50 MCG/ACT nasal spray 2 sprays by Nasal route 2 (two) times daily.        . montelukast (SINGULAIR) 10 MG tablet Take 10 mg by mouth daily.        . Multiple Vitamins-Minerals (CENTRUM) tablet Take 1 tablet by mouth daily.        . nitroGLYCERIN (NITROSTAT) 0.4 MG SL tablet as directed.        Marland Kitchen omeprazole (PRILOSEC) 20 MG capsule Take 20 mg by mouth daily.        Marland Kitchen  potassium chloride SA (KLOR-CON M20) 20 MEQ tablet Take 1 tablet (20 mEq total) by mouth 3 (three) times daily.  90 tablet  6    PHYSICAL EXAM: Filed Vitals:   07/20/11 0958  BP: 128/78  Pulse: 70   General:  Well appearing. No resp difficulty HEENT: normal Neck: supple. JVP flat. Carotids 2+ bilaterally; no bruits. No lymphadenopathy or thryomegaly appreciated. Cor: PMI normal. Regular rate & rhythm. No rubs, gallops or murmurs. Lungs: clear Abdomen: soft, nontender, nondistended. No hepatosplenomegaly. No bruits or masses. Good bowel sounds. Extremities: no cyanosis, clubbing, rash, edema. Left knee swelling  Neuro: alert & orientedx3, cranial nerves grossly intact. Moves all 4 extremities w/o difficulty. Affect pleasant.   ECG: NSR 70 No ST-T wave abnormalities.     ASSESSMENT & PLAN:

## 2011-07-20 NOTE — Patient Instructions (Signed)
Your physician wants you to follow-up in: ONE YEAR IN THE HEART FAILURE CLINIC=[phone] You will receive a reminder letter in the mail two months in advance. If you don't receive a letter, please call our office to schedule the follow-up appointment.

## 2011-07-20 NOTE — Assessment & Plan Note (Signed)
Lipids look great. Continue current regimen.  

## 2011-08-26 ENCOUNTER — Other Ambulatory Visit: Payer: Self-pay

## 2011-08-26 MED ORDER — AMLODIPINE BESYLATE 10 MG PO TABS: 10.0000 mg | ORAL_TABLET | Freq: Two times a day (BID) | ORAL | Status: DC

## 2011-08-26 NOTE — Telephone Encounter (Signed)
..   Requested Prescriptions   Signed Prescriptions Disp Refills  . amLODipine (NORVASC) 10 MG tablet 60 tablet 10    Sig: Take 1 tablet (10 mg total) by mouth 2 (two) times daily.    Authorizing Provider: Dolores Patty    Ordering User: Christella Hartigan, Shaneen Reeser Judie Petit

## 2011-09-03 ENCOUNTER — Ambulatory Visit (INDEPENDENT_AMBULATORY_CARE_PROVIDER_SITE_OTHER): Payer: Medicare Other | Admitting: Obstetrics and Gynecology

## 2011-09-03 ENCOUNTER — Encounter: Payer: Self-pay | Admitting: Obstetrics and Gynecology

## 2011-09-03 VITALS — BP 110/72 | Temp 98.6°F | Ht 62.0 in | Wt 137.0 lb

## 2011-09-03 DIAGNOSIS — D369 Benign neoplasm, unspecified site: Secondary | ICD-10-CM | POA: Insufficient documentation

## 2011-09-03 DIAGNOSIS — I10 Essential (primary) hypertension: Secondary | ICD-10-CM

## 2011-09-03 DIAGNOSIS — Z9889 Other specified postprocedural states: Secondary | ICD-10-CM

## 2011-09-03 DIAGNOSIS — R0789 Other chest pain: Secondary | ICD-10-CM

## 2011-09-03 DIAGNOSIS — J3089 Other allergic rhinitis: Secondary | ICD-10-CM | POA: Insufficient documentation

## 2011-09-03 DIAGNOSIS — I341 Nonrheumatic mitral (valve) prolapse: Secondary | ICD-10-CM

## 2011-09-03 DIAGNOSIS — J309 Allergic rhinitis, unspecified: Secondary | ICD-10-CM

## 2011-09-03 DIAGNOSIS — I059 Rheumatic mitral valve disease, unspecified: Secondary | ICD-10-CM

## 2011-09-03 DIAGNOSIS — J302 Other seasonal allergic rhinitis: Secondary | ICD-10-CM

## 2011-09-03 DIAGNOSIS — L0291 Cutaneous abscess, unspecified: Secondary | ICD-10-CM

## 2011-09-03 MED ORDER — HYDROCODONE-ACETAMINOPHEN 5-500 MG PO TABS: 1.0000 | ORAL_TABLET | Freq: Four times a day (QID) | ORAL | Status: DC | PRN

## 2011-09-03 MED ORDER — DOXYCYCLINE HYCLATE 100 MG PO TABS: 100.0000 mg | ORAL_TABLET | Freq: Two times a day (BID) | ORAL | Status: DC

## 2011-09-03 NOTE — Progress Notes (Signed)
71 YO with a history of buttock abscesses presents with the same. Since 1 week ago patient noticed a swelling with pain. On yesterday it burst but remains very tender and is draining clear fluid.  O: Left medial buttock (natal cleft) with 4 cm x 5 cm tender, erythematous indurated abscess with draining central opening yielding clear fluid with emerging necrotic material. Area prepped with Betadine, anesthetized with Lidocaine 1% plain (5 cc) and I & D performed.  Minimal clear drainage but semi-soft 0.5 cm core expressed, Loculations broken using curved forceps.  Dressed with sterile 4 x 4 gauze and perineal pad  A;  Left Buttock Abscess   P: Warm Soaks x 7 days      Doxycycline 100 bid x 7 days      Vicodin #12 1 po q 6 hours prn-moderate/severe pain       RTO-1 week for follow up

## 2011-09-03 NOTE — Patient Instructions (Signed)
Follow up in 1 week  Call for fever, worsening symptoms or other concerns  Warm water soaks daily for the next 7 days

## 2011-09-03 NOTE — Progress Notes (Signed)
PCP:DR. TAOBI  ASTHMA SPECIALIST:DR. BARDELAS  CARDIOLOGIST: DR. Gala Romney  ORTHOPEDIST:DR. DALLDORF  OB/GYN: DR. Pennie Rushing

## 2011-09-08 ENCOUNTER — Encounter: Payer: Self-pay | Admitting: Family Medicine

## 2011-09-08 ENCOUNTER — Ambulatory Visit (INDEPENDENT_AMBULATORY_CARE_PROVIDER_SITE_OTHER): Payer: Medicare Other | Admitting: Family Medicine

## 2011-09-08 VITALS — BP 118/80 | HR 78 | Temp 98.4°F | Ht 62.0 in | Wt 136.6 lb

## 2011-09-08 DIAGNOSIS — J45909 Unspecified asthma, uncomplicated: Secondary | ICD-10-CM

## 2011-09-08 DIAGNOSIS — I341 Nonrheumatic mitral (valve) prolapse: Secondary | ICD-10-CM

## 2011-09-08 DIAGNOSIS — I10 Essential (primary) hypertension: Secondary | ICD-10-CM

## 2011-09-08 DIAGNOSIS — I059 Rheumatic mitral valve disease, unspecified: Secondary | ICD-10-CM

## 2011-09-08 DIAGNOSIS — F419 Anxiety disorder, unspecified: Secondary | ICD-10-CM

## 2011-09-08 DIAGNOSIS — F411 Generalized anxiety disorder: Secondary | ICD-10-CM

## 2011-09-08 NOTE — Progress Notes (Signed)
  Subjective:    Patient ID: Amanda Jackson, female    DOB: [birth year], 71 y.o.   MRN: [id number]  HPI New to establish.  Previous MD- Dr Lenord Fellers.  OB-GYN- Haygood.  Cards- Dr Gala Romney.  Allergy/Asthma- Bardelas  Ortho- Dalldorf  HTN- chronic problem.  Denies CP, SOB above baseline, HAs, visual changes, edema.  Asthma- chronic problem, seeing Dr Beaulah Dinning.  On Singulair, Albuterol, Asmanex.  Feels sxs are fairly well controlled  Anxiety- chronic problem, taking Lorazepam twice daily.  Not in counseling.  Feels sxs are well controlled.   Review of Systems For ROS see HPI     Objective:   Physical Exam  Vitals reviewed. Constitutional: She is oriented to person, place, and time. She appears well-developed and well-nourished. No distress.  HENT:  Head: Normocephalic and atraumatic.  Eyes: Conjunctivae and EOM are normal. Pupils are equal, round, and reactive to light.  Neck: Normal range of motion. Neck supple. No thyromegaly present.  Cardiovascular: Normal rate, regular rhythm and intact distal pulses.   Murmur (I-II/VI SEM w/ mid-systolic click) heard. Pulmonary/Chest: Effort normal and breath sounds normal. No respiratory distress.  Abdominal: Soft. She exhibits no distension. There is no tenderness.  Musculoskeletal: She exhibits no edema.  Lymphadenopathy:    She has no cervical adenopathy.  Neurological: She is alert and oriented to person, place, and time.  Skin: Skin is warm and dry.  Psychiatric: She has a normal mood and affect. Her behavior is normal.          Assessment & Plan:

## 2011-09-08 NOTE — Patient Instructions (Signed)
Schedule your complete physical for after Dec 17 Think of [place] as your home base Call if you need anything Welcome!  We're glad to have you!

## 2011-09-09 ENCOUNTER — Other Ambulatory Visit: Payer: Self-pay | Admitting: *Deleted

## 2011-09-09 NOTE — Telephone Encounter (Signed)
Last OV 09-08-11, new Pt

## 2011-09-09 NOTE — Telephone Encounter (Signed)
Left message on voicemail informing patient to call and verify pharmacy

## 2011-09-09 NOTE — Telephone Encounter (Signed)
Ok for #60, 3 refills 

## 2011-09-10 ENCOUNTER — Encounter: Payer: Self-pay | Admitting: Obstetrics and Gynecology

## 2011-09-10 ENCOUNTER — Ambulatory Visit (INDEPENDENT_AMBULATORY_CARE_PROVIDER_SITE_OTHER): Payer: Medicare Other | Admitting: Obstetrics and Gynecology

## 2011-09-10 ENCOUNTER — Encounter (INDEPENDENT_AMBULATORY_CARE_PROVIDER_SITE_OTHER): Payer: Self-pay | Admitting: General Surgery

## 2011-09-10 ENCOUNTER — Ambulatory Visit (INDEPENDENT_AMBULATORY_CARE_PROVIDER_SITE_OTHER): Payer: Medicare Other | Admitting: General Surgery

## 2011-09-10 VITALS — BP 100/62 | Temp 98.1°F | Ht 62.0 in | Wt 138.0 lb

## 2011-09-10 VITALS — BP 108/74 | Temp 98.5°F | Resp 16 | Ht 62.0 in | Wt 137.4 lb

## 2011-09-10 DIAGNOSIS — L0291 Cutaneous abscess, unspecified: Secondary | ICD-10-CM

## 2011-09-10 DIAGNOSIS — K612 Anorectal abscess: Secondary | ICD-10-CM

## 2011-09-10 DIAGNOSIS — K611 Rectal abscess: Secondary | ICD-10-CM

## 2011-09-10 DIAGNOSIS — L0390 Cellulitis, unspecified: Secondary | ICD-10-CM

## 2011-09-10 MED ORDER — LORAZEPAM 0.5 MG PO TABS: 0.5000 mg | ORAL_TABLET | Freq: Two times a day (BID) | ORAL | Status: DC | PRN

## 2011-09-10 MED ORDER — CEPHALEXIN 500 MG PO CAPS: 500.0000 mg | ORAL_CAPSULE | Freq: Four times a day (QID) | ORAL | Status: DC

## 2011-09-10 NOTE — Progress Notes (Signed)
70 YO seen last week for buttock abscess that had begun to drain.  Placed on antibiotics and warm soaks.  Returns for follow up.  Feeling much better but feels like there may be more.  O:  Buttock: left medial natal cleft with mildly tender, indurated, 1.5 cm nodule with central purulent bead of pus.  Area prepped with Betadine and anesthetized with 3 cc of Plain Lidocaine 1%, I & D performed per protocol yielding a small amount of pus.  Dressed with sterile 4 x 4   A:  Buttock Abscess-(recurrent vs chronic)-improving   P: Continue antibiotics for 3 more days       Continue warm soaks or warm compresses daily       Refer to Dr. Lenise Arena surgery for removal of       abscess (patient desires a permanent solution)        RTO-annual exam or prn  Amanda Mcdiarmid, PA-C

## 2011-09-10 NOTE — Telephone Encounter (Signed)
.  left message to have patient return my call to verify pharmacy of choice

## 2011-09-10 NOTE — Patient Instructions (Signed)
See referral for General Surgeon-Dr. Donell Beers for removal of a chronic abscess

## 2011-09-10 NOTE — Telephone Encounter (Signed)
[facility] [address]

## 2011-09-10 NOTE — Telephone Encounter (Signed)
.  rx faxed to pharmacy, manually.  

## 2011-09-10 NOTE — Patient Instructions (Addendum)
The patient is to leave the packing in place for 24 hours then start doing sitz baths. She should remove a small portion of the packing each day but if the packing does fall out on its own this is okay. She would do sitz baths 3 times daily. She will followup with [place] in one week or sooner if needed.

## 2011-09-12 NOTE — Assessment & Plan Note (Signed)
New to provider, chronic for pt.  Feels sxs are well controlled on current regimen of Ativan.  No changes.

## 2011-09-12 NOTE — Assessment & Plan Note (Signed)
New to provider- chronic for pt.  Following w/ Dr Beaulah Dinning.  sxs well controlled.  No current SOB or wheezing.  Will follow and assist as able.

## 2011-09-12 NOTE — Assessment & Plan Note (Signed)
New to provider- chronic for pt.  Well controlled.  Asymptomatic.  No changes.

## 2011-09-12 NOTE — Assessment & Plan Note (Signed)
New to provider.  Chronic for pt.  Following w/ cards.  Currently asymptomatic.  Will follow along and assist as able.

## 2011-09-17 ENCOUNTER — Other Ambulatory Visit: Payer: Self-pay | Admitting: *Deleted

## 2011-09-17 ENCOUNTER — Encounter (INDEPENDENT_AMBULATORY_CARE_PROVIDER_SITE_OTHER): Payer: Self-pay | Admitting: General Surgery

## 2011-09-17 ENCOUNTER — Ambulatory Visit (INDEPENDENT_AMBULATORY_CARE_PROVIDER_SITE_OTHER): Payer: Medicare Other | Admitting: General Surgery

## 2011-09-17 VITALS — BP 118/80 | HR 90 | Temp 98.4°F | Resp 18 | Ht 62.0 in | Wt 133.0 lb

## 2011-09-17 DIAGNOSIS — K611 Rectal abscess: Secondary | ICD-10-CM

## 2011-09-17 DIAGNOSIS — K612 Anorectal abscess: Secondary | ICD-10-CM

## 2011-09-17 MED ORDER — POTASSIUM CHLORIDE CRYS ER 20 MEQ PO TBCR: 20.0000 meq | EXTENDED_RELEASE_TABLET | Freq: Three times a day (TID) | ORAL | Status: DC

## 2011-09-17 NOTE — Telephone Encounter (Signed)
Pt left vm requesting refill for Pot[facility] [address], rx sent to pharmacy by e-script

## 2011-09-17 NOTE — Progress Notes (Signed)
Subjective:     Patient ID: Amanda Jackson, female   DOB: 1941-04-23, 71 y.o.   MRN: [id number]  HPI Patient is for followup status post incision and drainage of right perirectal abscess. She is feeling better. She is moving her bowels normally. Her pain is resolved.  Review of Systems     Objective:   Physical Exam Abscess site has nearly healed. There is no ongoing infection. There is a small bit of granulation tissue.    Assessment:     Doing well status post incision and drainage of perirectal abscess.    Plan:     Continue local care and return p.r.n.

## 2011-10-01 ENCOUNTER — Telehealth: Payer: Self-pay | Admitting: *Deleted

## 2011-10-01 MED ORDER — FA-PYRIDOXINE-CYANOCOBALAMIN 2.5-25-2 MG PO TABS: 1.0000 | ORAL_TABLET | Freq: Every day | ORAL | Status: DC

## 2011-10-01 NOTE — Telephone Encounter (Signed)
Noted waiver in pt chart signed to allow detailed messages to be left on voicemail, left detailed message about: results/instructions/prescribtion information. Advise if any further concerns or questions please call our office at [phone]. [facility] [address], left detailed message with MD Beverely Low instructions, advise pt if any concerns to call office

## 2011-10-01 NOTE — Telephone Encounter (Signed)
Ok for refill on folbic x12 It is up to pt if she wants to switch from ibuprofen to naproxen- these meds are similar and both carry the same heart attack warnings, but only in pt's w/ heart disease.

## 2011-10-01 NOTE — Telephone Encounter (Signed)
Pt would like to change from ibuprofen to  Naproxen due to recent studies that med causes heart attack or stroke. Pt indicated that if the naproxen is not a suitable substitute which ever is preferred by Dr Beverely Low. Pt would also like to get a refill on folbic.Please advise   [facility] [address]

## 2011-10-13 ENCOUNTER — Telehealth: Payer: Self-pay | Admitting: *Deleted

## 2011-10-13 MED ORDER — NITROGLYCERIN 0.4 MG SL SUBL: SUBLINGUAL_TABLET | SUBLINGUAL | Status: DC

## 2011-10-13 NOTE — Telephone Encounter (Signed)
Pt left vm requesting refill on nitroquick to be [facility] [address] per notes she changed MD's and wants MD Tabori to please send  rx sent to pharmacy by e-script per verbal orders via MD Beverely Low

## 2011-10-18 ENCOUNTER — Ambulatory Visit: Payer: Medicare Other | Admitting: Internal Medicine

## 2011-10-24 ENCOUNTER — Other Ambulatory Visit: Payer: Self-pay | Admitting: Internal Medicine

## 2011-10-26 ENCOUNTER — Other Ambulatory Visit: Payer: Self-pay | Admitting: Internal Medicine

## 2011-10-26 MED ORDER — CARVEDILOL 12.5 MG PO TABS: 12.5000 mg | ORAL_TABLET | Freq: Two times a day (BID) | ORAL | Status: DC

## 2011-12-31 ENCOUNTER — Other Ambulatory Visit: Payer: Self-pay | Admitting: Family Medicine

## 2011-12-31 MED ORDER — OMEPRAZOLE 20 MG PO CPDR: 20.0000 mg | DELAYED_RELEASE_CAPSULE | Freq: Two times a day (BID) | ORAL | Status: DC

## 2011-12-31 MED ORDER — LOSARTAN POTASSIUM-HCTZ 100-25 MG PO TABS: 1.0000 | ORAL_TABLET | Freq: Every day | ORAL | Status: DC

## 2011-12-31 NOTE — Telephone Encounter (Signed)
Also add omeprazole dr 20mg  capsules #60 take one tablet by mouth twice a day -- last fill 7.26.13

## 2011-12-31 NOTE — Telephone Encounter (Signed)
Placed new RX for medications per noted changes from last PCP, sent all refills via escribe

## 2011-12-31 NOTE — Telephone Encounter (Signed)
Refill Losartan Potassium-HCTZ (Tab) 100-25 MG TAKE 1 TABLET EVERY DAY #30 --last fill 7.15.13, last ov 5.15.13  NOTE Pharmacy states prescribed refills [phone] bring [phone] for review

## 2012-01-04 ENCOUNTER — Telehealth: Payer: Self-pay | Admitting: Family Medicine

## 2012-01-04 MED ORDER — LOSARTAN POTASSIUM-HCTZ 100-25 MG PO TABS: 1.0000 | ORAL_TABLET | Freq: Every day | ORAL | Status: DC

## 2012-01-04 MED ORDER — OMEPRAZOLE 20 MG PO CPDR: 20.0000 mg | DELAYED_RELEASE_CAPSULE | Freq: Two times a day (BID) | ORAL | Status: DC

## 2012-01-04 NOTE — Telephone Encounter (Signed)
Ok to refill ibuprofen 800mg? 

## 2012-01-04 NOTE — Telephone Encounter (Signed)
Pt calld lm on triage line at 1240pm needs refills of  Losartan & ibuprofen 800mg  [facility] [address] It looks like Losartan has been done, CB# [phone]

## 2012-01-04 NOTE — Addendum Note (Signed)
Addended by: Edwena Felty T on: 01/04/2012 06:00 PM   Modules accepted: Orders

## 2012-01-04 NOTE — Telephone Encounter (Signed)
It looks like [facility] [address] [id number], please review and as advise as I have received a 2nd request from pharmacy today  Thanks

## 2012-01-04 NOTE — Telephone Encounter (Signed)
Resent meds to correct pharmacy

## 2012-01-05 NOTE — Telephone Encounter (Signed)
Ok for ibuprofen #90, 1 refill Ok to refill Losartan for 6 months

## 2012-01-06 MED ORDER — LOSARTAN POTASSIUM-HCTZ 100-25 MG PO TABS: 1.0000 | ORAL_TABLET | Freq: Every day | ORAL | Status: DC

## 2012-01-06 MED ORDER — IBUPROFEN 800 MG PO TABS: 800.0000 mg | ORAL_TABLET | Freq: Three times a day (TID) | ORAL | Status: DC | PRN

## 2012-01-06 NOTE — Telephone Encounter (Signed)
rx sent to pharmacy by e-script  

## 2012-01-14 ENCOUNTER — Other Ambulatory Visit: Payer: Self-pay

## 2012-01-14 MED ORDER — IBUPROFEN 800 MG PO TABS: 800.0000 mg | ORAL_TABLET | Freq: Three times a day (TID) | ORAL | Status: DC | PRN

## 2012-01-14 NOTE — Telephone Encounter (Signed)
Pt called requesting a refill Ibuprofen 800 mg  [facility] to [facility] [address] Cancelled Rx at [facility]

## 2012-01-17 ENCOUNTER — Other Ambulatory Visit: Payer: Self-pay | Admitting: Family Medicine

## 2012-01-17 NOTE — Telephone Encounter (Signed)
refill lorazepam 0.5mg  tablet take one tablet by mouth twice a day as needed --- last fill 8.24.13 Last ov 5.15.13

## 2012-01-17 NOTE — Telephone Encounter (Signed)
#  60 1 bid prn  

## 2012-01-17 NOTE — Telephone Encounter (Signed)
Last OV 09/08/11, pending OV 04/12/12. Last filled on 09/10/11 #60/3 refills Dr.Tabori is out of the office please advise on refill request and SEND TO KIM PULLIMA (Dr.Tabori's LPN)

## 2012-01-18 ENCOUNTER — Telehealth: Payer: Self-pay

## 2012-01-18 MED ORDER — LORAZEPAM 0.5 MG PO TABS: 0.5000 mg | ORAL_TABLET | Freq: Two times a day (BID) | ORAL | Status: DC | PRN

## 2012-01-18 NOTE — Telephone Encounter (Signed)
.  rx faxed to pharmacy, manually.  

## 2012-01-18 NOTE — Telephone Encounter (Signed)
Pt calling for refill of her [facility]azepam to [facility] [address]

## 2012-01-18 NOTE — Telephone Encounter (Signed)
rx sent in earlier by Tresa Moore.

## 2012-02-02 ENCOUNTER — Other Ambulatory Visit: Payer: Self-pay | Admitting: Family Medicine

## 2012-02-02 MED ORDER — POTASSIUM CHLORIDE CRYS ER 20 MEQ PO TBCR: 20.0000 meq | EXTENDED_RELEASE_TABLET | Freq: Three times a day (TID) | ORAL | Status: DC

## 2012-02-02 NOTE — Telephone Encounter (Signed)
Pt requesting KLOR-CON 20 MEQ Take 1 tablet (20 mEq total) by mouth 3 (three) times daily #90 wt/3-refills last fill 5.27.13  Last ov 5.15.13 est. Care Need okay        MW

## 2012-02-02 NOTE — Telephone Encounter (Signed)
rx sent to pharmacy by e-script  

## 2012-02-02 NOTE — Telephone Encounter (Signed)
KLOR-CON 20 MEQ Take 1 tablet (20 mEq total) by mouth 3 (three) times daily #90 wt/3-refills last fill 5.27.13  Last ov 5.15.13 est. care

## 2012-02-16 ENCOUNTER — Telehealth: Payer: Self-pay | Admitting: Family Medicine

## 2012-02-16 MED ORDER — LORAZEPAM 0.5 MG PO TABS: 0.5000 mg | ORAL_TABLET | Freq: Two times a day (BID) | ORAL | Status: DC | PRN

## 2012-02-16 NOTE — Telephone Encounter (Signed)
Last OV 09-08-11 upcoming CPE on 04-12-12 last refill 01-18-12 #60 no refills

## 2012-02-16 NOTE — Telephone Encounter (Signed)
.  rx faxed to pharmacy, manually.  

## 2012-02-16 NOTE — Telephone Encounter (Signed)
Ok for #90, 1 refill 

## 2012-02-16 NOTE — Telephone Encounter (Signed)
Refill: Lorazepam 0.5mg  tablet. Take 1 tablet by mouth twice a day as needed. Qty 60. Last fill 9.24.13

## 2012-03-08 ENCOUNTER — Telehealth: Payer: Self-pay | Admitting: *Deleted

## 2012-03-08 NOTE — Telephone Encounter (Signed)
Per Dr Beverely Low: No evidence of thinning bones. Great news.  Pt aware, copy mailed

## 2012-03-10 ENCOUNTER — Telehealth: Payer: Self-pay | Admitting: Family Medicine

## 2012-03-10 MED ORDER — POTASSIUM CHLORIDE CRYS ER 20 MEQ PO TBCR: 20.0000 meq | EXTENDED_RELEASE_TABLET | Freq: Three times a day (TID) | ORAL | Status: DC

## 2012-03-10 NOTE — Telephone Encounter (Signed)
Refill: Klor-con m20 tablet. Take 1 tablet by mouth 3 times daily. Qty 90. Last fill 02-02-12

## 2012-03-10 NOTE — Telephone Encounter (Signed)
Spoke to pharmacy did not received Rx on 02-02-12. Rx resent to pharmacy.

## 2012-03-13 ENCOUNTER — Encounter: Payer: Self-pay | Admitting: Family Medicine

## 2012-03-29 ENCOUNTER — Telehealth: Payer: Self-pay

## 2012-03-29 NOTE — Telephone Encounter (Signed)
Pt LMOVM stating shifting to [employer] [facility] [employer] [facility] as secondary insurance Medicare still primary and picking up enhanced plan. The enhanced plan will take care of gap [facility] doesn't pay. Pt asking Will we fill out paper work and submit  To enhanced plan because [facility] is 70/30 and she needs this help with covering the 30% she is responsible for.Pt requesting to be called and advise so she will know what she needs to do. Plz advise pt WU#[phone] MW

## 2012-03-30 NOTE — Telephone Encounter (Signed)
Left message to call office

## 2012-04-11 ENCOUNTER — Encounter: Payer: Self-pay | Admitting: Lab

## 2012-04-11 ENCOUNTER — Encounter: Payer: Self-pay | Admitting: *Deleted

## 2012-04-11 NOTE — Telephone Encounter (Signed)
Tried to call Pt phone rang then just beep, Letter Mail to Pt to contact office.

## 2012-04-12 ENCOUNTER — Encounter: Payer: Self-pay | Admitting: Family Medicine

## 2012-04-12 ENCOUNTER — Ambulatory Visit (INDEPENDENT_AMBULATORY_CARE_PROVIDER_SITE_OTHER): Payer: Medicare Other | Admitting: Family Medicine

## 2012-04-12 VITALS — BP 120/72 | HR 70 | Temp 97.7°F | Ht 61.75 in | Wt 136.2 lb

## 2012-04-12 DIAGNOSIS — I10 Essential (primary) hypertension: Secondary | ICD-10-CM

## 2012-04-12 DIAGNOSIS — Z0000 Encounter for general adult medical examination without abnormal findings: Secondary | ICD-10-CM

## 2012-04-12 DIAGNOSIS — K219 Gastro-esophageal reflux disease without esophagitis: Secondary | ICD-10-CM | POA: Insufficient documentation

## 2012-04-12 LAB — CBC WITH DIFFERENTIAL/PLATELET
Basophils Relative: 0.6 % (ref 0.0–3.0)
Eosinophils Absolute: 0.1 10*3/uL (ref 0.0–0.7)
Eosinophils Relative: 3.2 % (ref 0.0–5.0)
Hemoglobin: 12.3 g/dL (ref 12.0–15.0)
Lymphocytes Relative: 28.3 % (ref 12.0–46.0)
MCHC: 33 g/dL (ref 30.0–36.0)
Neutro Abs: 2.6 10*3/uL (ref 1.4–7.7)
Neutrophils Relative %: 57.2 % (ref 43.0–77.0)
RBC: 4 Mil/uL (ref 3.87–5.11)
WBC: 4.5 10*3/uL (ref 4.5–10.5)

## 2012-04-12 LAB — BASIC METABOLIC PANEL
BUN: 11 mg/dL (ref 6–23)
CO2: 26 mEq/L (ref 19–32)
Chloride: 108 mEq/L (ref 96–112)
Creatinine, Ser: 0.5 mg/dL (ref 0.4–1.2)
Glucose, Bld: 104 mg/dL — ABNORMAL HIGH (ref 70–99)
Potassium: 2.9 mEq/L — ABNORMAL LOW (ref 3.5–5.1)

## 2012-04-12 LAB — HEPATIC FUNCTION PANEL
ALT: 16 U/L (ref 0–35)
AST: 20 U/L (ref 0–37)
Albumin: 4.1 g/dL (ref 3.5–5.2)
Total Protein: 7.4 g/dL (ref 6.0–8.3)

## 2012-04-12 LAB — LIPID PANEL
Cholesterol: 168 mg/dL (ref 0–200)
LDL Cholesterol: 89 mg/dL (ref 0–99)
Triglycerides: 81 mg/dL (ref 0.0–149.0)

## 2012-04-12 LAB — TSH: TSH: 0.3 u[IU]/mL — ABNORMAL LOW (ref 0.35–5.50)

## 2012-04-12 NOTE — Assessment & Plan Note (Signed)
Chronic problem.  Well controlled.  Asymptomatic.  Check labs.  No anticipated changes. 

## 2012-04-12 NOTE — Patient Instructions (Addendum)
Follow up in 6 months to recheck BP We'll notify you of your lab results and make any changes if needed Keep up the good work!  You look great! Call with any questions or concerns Happy Holidays! 

## 2012-04-12 NOTE — Progress Notes (Signed)
  Subjective:    Patient ID: Amanda Jackson, female    DOB: 1940/05/11, 71 y.o.   MRN: [id number]  HPI Here today for CPE.  Risk Factors: HTN- Chronic problem, on amlodipine, coreg, hyzaar.  No CP.  + SOB w/ stairs.  No HAs, visual changes, edema. GERD- chronic problem, on omeprazole daily.  Denies current sxs Physical Activity: walking regularly Fall Risk: low risk Depression: no current sxs, hx of anxiety Hearing: normal to conversational tones, mildly decreased to whispered voice ADL's: independent Cognitive: normal linear thought process, memory and attention intact Home Safety: safe at home Height, Weight, BMI, Visual Acuity: see vitals, vision corrected to 20/20 w/ glasses Counseling: UTD on colonoscopy, mammo, DEXA Labs Ordered: See A&P Care Plan: See A&P   Review of Systems Patient reports no vision/ hearing changes, adenopathy,fever, weight change,  persistant/recurrent hoarseness , swallowing issues, chest pain, palpitations, edema, persistant/recurrent cough, hemoptysis, gastrointestinal bleeding (melena, rectal bleeding), abdominal pain, significant heartburn, bowel changes, GU symptoms (dysuria, hematuria, incontinence), Gyn symptoms (abnormal  bleeding, pain),  syncope, focal weakness, memory loss, numbness & tingling, skin/hair/nail changes, abnormal bruising or bleeding.     Objective:   Physical Exam General Appearance:    Alert, cooperative, no distress, appears stated age  Head:    Normocephalic, without obvious abnormality, atraumatic  Eyes:    PERRL, conjunctiva/corneas clear, EOM's intact, fundi    benign, both eyes  Ears:    Normal TM's and external ear canals, both ears  Nose:   Nares normal, septum midline, mucosa normal, no drainage    or sinus tenderness  Throat:   Lips, mucosa, and tongue normal; teeth and gums normal  Neck:   Supple, symmetrical, trachea midline, no adenopathy;    Thyroid: no enlargement/tenderness/nodules  Back:     Symmetric, no  curvature, ROM normal, no CVA tenderness  Lungs:     Clear to auscultation bilaterally, respirations unlabored  Chest Wall:    No tenderness or deformity   Heart:    Regular rate and rhythm, S1 and S2 normal, no murmur, rub   or gallop  Breast Exam:    Deferred to GYN  Abdomen:     Soft, non-tender, bowel sounds active all four quadrants,    no masses, no organomegaly  Genitalia:    Deferred to GYN  Rectal:    Extremities:   Extremities normal, atraumatic, no cyanosis or edema  Pulses:   2+ and symmetric all extremities  Skin:   Skin color, texture, turgor normal, no rashes or lesions  Lymph nodes:   Cervical, supraclavicular, and axillary nodes normal  Neurologic:   CNII-XII intact, normal strength, sensation and reflexes    throughout          Assessment & Plan:

## 2012-04-12 NOTE — Assessment & Plan Note (Signed)
Chronic problem, well controlled.  Asymptomatic.

## 2012-04-12 NOTE — Assessment & Plan Note (Signed)
Pt's PE WNL.  UTD on health maintenance.  Check labs.  Anticipatory guidance provided.  

## 2012-04-13 ENCOUNTER — Other Ambulatory Visit: Payer: Self-pay | Admitting: Family Medicine

## 2012-04-13 NOTE — Telephone Encounter (Signed)
LORAZEPAM 0.5MG  TABLET LAST FILL: 11.22.13 TAKE 1 TABLET BY MOUTH TWICE A DAY AS NEEDED  KLOR-CON M20 TABLET QTY:90 LAST FILL:11.15.13 TAKE 1 TABLET 3 TIMES A DAY

## 2012-04-14 MED ORDER — POTASSIUM CHLORIDE CRYS ER 20 MEQ PO TBCR: 20.0000 meq | EXTENDED_RELEASE_TABLET | Freq: Three times a day (TID) | ORAL | Status: DC

## 2012-04-14 MED ORDER — LORAZEPAM 0.5 MG PO TABS: 0.5000 mg | ORAL_TABLET | Freq: Two times a day (BID) | ORAL | Status: DC | PRN

## 2012-04-14 NOTE — Telephone Encounter (Signed)
Fill x1

## 2012-04-14 NOTE — Telephone Encounter (Signed)
Rx faxed.    KP 

## 2012-04-14 NOTE — Telephone Encounter (Signed)
Please advise in Dr.Tabori's absence     KP

## 2012-04-25 ENCOUNTER — Telehealth: Payer: Self-pay | Admitting: Lab

## 2012-04-25 NOTE — Telephone Encounter (Signed)
Message copied by Vance Gather on Tue Apr 25, 2012  7:59 AM ------      Message from: Sheliah Hatch      Created: Mon Apr 24, 2012  7:51 PM       Pt needs:      TSH- abnormal TSH      Free T3/T4- abnormal TSH      BMP- hypokalemia      Magnesium- hypokalemia

## 2012-04-25 NOTE — Telephone Encounter (Signed)
Please call to set up

## 2012-04-25 NOTE — Telephone Encounter (Signed)
Pt scheduled for labs 04/27/12.

## 2012-04-27 ENCOUNTER — Other Ambulatory Visit (INDEPENDENT_AMBULATORY_CARE_PROVIDER_SITE_OTHER): Payer: Medicare Other

## 2012-04-27 DIAGNOSIS — R946 Abnormal results of thyroid function studies: Secondary | ICD-10-CM

## 2012-04-27 DIAGNOSIS — R7989 Other specified abnormal findings of blood chemistry: Secondary | ICD-10-CM

## 2012-04-27 DIAGNOSIS — R6889 Other general symptoms and signs: Secondary | ICD-10-CM

## 2012-04-27 DIAGNOSIS — E876 Hypokalemia: Secondary | ICD-10-CM

## 2012-04-27 LAB — BASIC METABOLIC PANEL
BUN: 15 mg/dL (ref 6–23)
Chloride: 111 mEq/L (ref 96–112)
Potassium: 3.4 mEq/L — ABNORMAL LOW (ref 3.5–5.1)

## 2012-04-27 LAB — TSH: TSH: 0.68 u[IU]/mL (ref 0.35–5.50)

## 2012-04-27 LAB — MAGNESIUM: Magnesium: 1.9 mg/dL (ref 1.5–2.5)

## 2012-05-15 ENCOUNTER — Telehealth: Payer: Self-pay | Admitting: Family Medicine

## 2012-05-15 DIAGNOSIS — E876 Hypokalemia: Secondary | ICD-10-CM

## 2012-05-15 MED ORDER — POTASSIUM CHLORIDE CRYS ER 20 MEQ PO TBCR: 20.0000 meq | EXTENDED_RELEASE_TABLET | Freq: Three times a day (TID) | ORAL | Status: DC

## 2012-05-15 NOTE — Telephone Encounter (Signed)
Refills x 2  1-KLOR-CON 20 MEQ Take 1 tablet (20 mEq total) by mouth 3 (three) times daily #90 last fill 12.20.13  2-LORazepam (Tab) 0.5 MG Take 1 tablet (0.5 mg total) by mouth 2 (two) times daily as needed. No qty listed last fill 12.20.13

## 2012-05-15 NOTE — Telephone Encounter (Signed)
Refill for Klor-Con sent to [facility]

## 2012-05-22 ENCOUNTER — Telehealth: Payer: Self-pay | Admitting: *Deleted

## 2012-05-22 DIAGNOSIS — F419 Anxiety disorder, unspecified: Secondary | ICD-10-CM

## 2012-05-22 MED ORDER — LORAZEPAM 0.5 MG PO TABS: 0.5000 mg | ORAL_TABLET | Freq: Two times a day (BID) | ORAL | Status: DC | PRN

## 2012-05-22 NOTE — Telephone Encounter (Signed)
Patient is requesting refill on lorazepam. Last filled 04/14/12 #90, last OV 04/12/12 CPE. Okay to refill? Please advise.

## 2012-05-22 NOTE — Telephone Encounter (Signed)
Ok for #90, 3 refills 

## 2012-05-22 NOTE — Telephone Encounter (Signed)
Refill for Ativan #90 with 3 RF printed and faxed to pharmacy.

## 2012-05-31 ENCOUNTER — Other Ambulatory Visit: Payer: Self-pay | Admitting: Family Medicine

## 2012-05-31 MED ORDER — LOSARTAN POTASSIUM-HCTZ 100-25 MG PO TABS: 1.0000 | ORAL_TABLET | Freq: Every day | ORAL | Status: DC

## 2012-05-31 NOTE — Telephone Encounter (Signed)
refill Losartan HCTZ (Tab) 100-25 MG Take 1 tablet by mouth daily. #30 wt/3-refills last fill 12.13.13

## 2012-05-31 NOTE — Telephone Encounter (Signed)
RX sent

## 2012-06-19 ENCOUNTER — Other Ambulatory Visit: Payer: Self-pay | Admitting: Family Medicine

## 2012-06-19 DIAGNOSIS — E876 Hypokalemia: Secondary | ICD-10-CM

## 2012-06-20 NOTE — Telephone Encounter (Signed)
Refill for Klor-Con sent to [facility]

## 2012-06-21 ENCOUNTER — Encounter: Payer: Self-pay | Admitting: Family Medicine

## 2012-07-07 ENCOUNTER — Other Ambulatory Visit: Payer: Self-pay | Admitting: Family Medicine

## 2012-07-07 ENCOUNTER — Other Ambulatory Visit: Payer: Self-pay | Admitting: Internal Medicine

## 2012-07-25 ENCOUNTER — Other Ambulatory Visit: Payer: Self-pay | Admitting: Internal Medicine

## 2012-07-27 ENCOUNTER — Other Ambulatory Visit: Payer: Self-pay | Admitting: Family Medicine

## 2012-07-27 ENCOUNTER — Other Ambulatory Visit: Payer: Self-pay | Admitting: Internal Medicine

## 2012-07-28 ENCOUNTER — Other Ambulatory Visit: Payer: Self-pay | Admitting: *Deleted

## 2012-07-28 DIAGNOSIS — M79609 Pain in unspecified limb: Secondary | ICD-10-CM

## 2012-07-28 DIAGNOSIS — E876 Hypokalemia: Secondary | ICD-10-CM

## 2012-07-28 MED ORDER — IBUPROFEN 800 MG PO TABS: 800.0000 mg | ORAL_TABLET | Freq: Three times a day (TID) | ORAL | Status: DC | PRN

## 2012-07-28 NOTE — Telephone Encounter (Signed)
Refill for ibuprofen [facility] on [address]

## 2012-08-07 ENCOUNTER — Other Ambulatory Visit: Payer: Medicare Other

## 2012-08-07 ENCOUNTER — Encounter: Payer: Self-pay | Admitting: *Deleted

## 2012-08-07 LAB — TSH: TSH: 0.91 u[IU]/mL (ref 0.35–5.50)

## 2012-08-09 ENCOUNTER — Ambulatory Visit: Payer: Medicare Other | Admitting: Family Medicine

## 2012-08-15 ENCOUNTER — Other Ambulatory Visit: Payer: Self-pay | Admitting: Internal Medicine

## 2012-08-17 NOTE — Progress Notes (Signed)
Pt presented with perirectal abscess.  This area was drained in the office.  Instructions given for wound care.  Follow up PRN.

## 2012-09-08 ENCOUNTER — Other Ambulatory Visit (HOSPITAL_COMMUNITY): Payer: Self-pay | Admitting: *Deleted

## 2012-09-08 MED ORDER — AMLODIPINE BESYLATE 10 MG PO TABS: 10.0000 mg | ORAL_TABLET | Freq: Two times a day (BID) | ORAL | Status: DC

## 2012-09-19 ENCOUNTER — Other Ambulatory Visit (HOSPITAL_COMMUNITY): Payer: Self-pay | Admitting: *Deleted

## 2012-09-19 MED ORDER — AMLODIPINE BESYLATE 10 MG PO TABS: 10.0000 mg | ORAL_TABLET | Freq: Two times a day (BID) | ORAL | Status: DC

## 2012-09-22 ENCOUNTER — Telehealth: Payer: Self-pay | Admitting: General Practice

## 2012-09-22 MED ORDER — AMLODIPINE BESYLATE 10 MG PO TABS: 10.0000 mg | ORAL_TABLET | Freq: Two times a day (BID) | ORAL | Status: DC

## 2012-09-22 NOTE — Telephone Encounter (Signed)
LMOM informing the pt that the refill request has been sent to the pharmacy.  If she has any questions she can give me a call back.//AB/CMA

## 2012-09-22 NOTE — Telephone Encounter (Signed)
Ok for #30, 6 refills 

## 2012-09-22 NOTE — Telephone Encounter (Signed)
Pt called stating that her Pharmacy has been trying to contact Dr. Gala Romney to refill her amolodipine. Pt states she has been without med for 3 days. Would like to know if we could fill it for her. Please advise.   [facility] [address]

## 2012-09-25 ENCOUNTER — Other Ambulatory Visit (HOSPITAL_COMMUNITY): Payer: Self-pay | Admitting: *Deleted

## 2012-09-25 MED ORDER — AMLODIPINE BESYLATE 10 MG PO TABS: 10.0000 mg | ORAL_TABLET | Freq: Two times a day (BID) | ORAL | Status: DC

## 2012-10-02 ENCOUNTER — Other Ambulatory Visit: Payer: Self-pay | Admitting: Family Medicine

## 2012-10-02 NOTE — Telephone Encounter (Signed)
Rx sent to the pharmacy by e-script.//AB/CMA 

## 2012-10-11 ENCOUNTER — Encounter: Payer: Self-pay | Admitting: Family Medicine

## 2012-10-11 ENCOUNTER — Ambulatory Visit (INDEPENDENT_AMBULATORY_CARE_PROVIDER_SITE_OTHER): Payer: Medicare Other | Admitting: Family Medicine

## 2012-10-11 VITALS — BP 100/62 | HR 82 | Temp 97.8°F | Ht 62.75 in | Wt 127.8 lb

## 2012-10-11 DIAGNOSIS — I10 Essential (primary) hypertension: Secondary | ICD-10-CM

## 2012-10-11 DIAGNOSIS — R7989 Other specified abnormal findings of blood chemistry: Secondary | ICD-10-CM | POA: Insufficient documentation

## 2012-10-11 DIAGNOSIS — R6889 Other general symptoms and signs: Secondary | ICD-10-CM

## 2012-10-11 LAB — BASIC METABOLIC PANEL
GFR: 129 mL/min (ref >60.00)
Potassium: 2.6 mEq/L — CL (ref 3.5–5.1)
Sodium: 139 mEq/L (ref 135–145)

## 2012-10-11 LAB — T3, FREE: T3, Free: 2.6 pg/mL (ref 2.3–4.2)

## 2012-10-11 NOTE — Patient Instructions (Addendum)
Schedule your complete physical in 6 months We'll notify you of your lab results and make any changes if needed Keep up the good work!  You look great! Call with any questions or concerns Have a great summer!!! 

## 2012-10-11 NOTE — Assessment & Plan Note (Signed)
New.  Check labs.  Start meds prn.  Pt currently asymptomatic.  Will follow.

## 2012-10-11 NOTE — Progress Notes (Signed)
  Subjective:    Patient ID: Amanda Jackson, female    DOB: 10/05/1940, 72 y.o.   MRN: [id number]  HPI HTN- chronic problem, excellent control on Norvasc, Coreg, Hyzaar.  Last labs showed hypokalemia- on K+ daily.  Denies excessive cramping.  No CP, SOB, HAs, visual changes, edema.  Abnormal TSH- pt had abnormal labs at last visit.  T3 was slightly low.  TSH was normal in April.   Review of Systems For ROS see HPI     Objective:   Physical Exam  Vitals reviewed. Constitutional: She is oriented to person, place, and time. She appears well-developed and well-nourished. No distress.  HENT:  Head: Normocephalic and atraumatic.  Eyes: Conjunctivae and EOM are normal. Pupils are equal, round, and reactive to light.  Neck: Normal range of motion. Neck supple. No thyromegaly present.  Cardiovascular: Normal rate, regular rhythm, normal heart sounds and intact distal pulses.   No murmur heard. Pulmonary/Chest: Effort normal and breath sounds normal. No respiratory distress.  Abdominal: Soft. She exhibits no distension. There is no tenderness.  Musculoskeletal: She exhibits no edema.  Lymphadenopathy:    She has no cervical adenopathy.  Neurological: She is alert and oriented to person, place, and time.  Skin: Skin is warm and dry.  Psychiatric: She has a normal mood and affect. Her behavior is normal.          Assessment & Plan:

## 2012-10-11 NOTE — Assessment & Plan Note (Signed)
Chronic problem.  Excellent control.  Asymptomatic.  Check labs.  No anticipated changes. 

## 2012-10-12 ENCOUNTER — Telehealth: Payer: Self-pay | Admitting: *Deleted

## 2012-10-12 ENCOUNTER — Ambulatory Visit: Payer: Medicare Other

## 2012-10-12 DIAGNOSIS — E876 Hypokalemia: Secondary | ICD-10-CM

## 2012-10-12 LAB — MAGNESIUM: Magnesium: 1.9 mg/dL (ref 1.5–2.5)

## 2012-10-12 NOTE — Telephone Encounter (Signed)
Received call from Tonya from the lab on (10-11-12) to report a critical lab on the pt.  The pt's Potassium was low at(2.6).  Called and informed Dr. Beverely Low of the results and she gave verbal orders to have the pt increase her potassium to 2 tablets tid and repeat the potassium level in 1 mos, and also add an Magnesium level to the recent bloodwork.  Called and informed the pt that her potassium level was low at(2.6), and Dr. Beverely Low would like for her to increase her potassium to 2 tablets tid, and repeat potassium level in 1 mos.  Also we will add a Magnesium level to her recent bloodwork.   Pt understood and agreed.  Spoke with the pt this am and she stated that she increased her potassium last night.  Scheduled pt for lab appt on Monday 11-13-12 @ 8:15am to recheck potassium level.  Added Magnesium level to recent bloodwork.//AB/CMA

## 2012-10-24 ENCOUNTER — Other Ambulatory Visit: Payer: Self-pay | Admitting: Family Medicine

## 2012-10-24 NOTE — Telephone Encounter (Signed)
Spoke with patient's pharmacy. No refills on file. Refill done per protocol.

## 2012-10-25 ENCOUNTER — Other Ambulatory Visit: Payer: Self-pay | Admitting: Family Medicine

## 2012-10-26 NOTE — Telephone Encounter (Signed)
Spoke to pharmacy, pt still has refills left on file. Refill request sent in error.  

## 2012-10-29 ENCOUNTER — Other Ambulatory Visit: Payer: Self-pay | Admitting: Family Medicine

## 2012-10-31 NOTE — Telephone Encounter (Signed)
Ok to refill? Last OV 6.18.14 Last filled 1.27.14

## 2012-11-02 ENCOUNTER — Other Ambulatory Visit: Payer: Self-pay | Admitting: Family Medicine

## 2012-11-07 NOTE — Telephone Encounter (Signed)
Rx called in to pharmacy from Rx dated 10-29-12

## 2012-11-07 NOTE — Telephone Encounter (Signed)
Rx called in to pharmacy. 

## 2012-11-08 ENCOUNTER — Other Ambulatory Visit: Payer: Self-pay | Admitting: *Deleted

## 2012-11-08 DIAGNOSIS — I10 Essential (primary) hypertension: Secondary | ICD-10-CM

## 2012-11-08 MED ORDER — LOSARTAN POTASSIUM-HCTZ 100-25 MG PO TABS: 1.0000 | ORAL_TABLET | Freq: Every day | ORAL | Status: DC

## 2012-11-08 NOTE — Telephone Encounter (Signed)
Rx was Refilled 11/08/2012

## 2012-11-10 ENCOUNTER — Telehealth: Payer: Self-pay | Admitting: Family Medicine

## 2012-11-10 NOTE — Telephone Encounter (Signed)
LVM for patient that Rx has been updated at pharmacy. GF/RN

## 2012-11-10 NOTE — Telephone Encounter (Signed)
Patient states that [facility] to allow her to refill her Klorcon medication early. She states that Dr. Beverely Low increased her dosage and she is now out of it.

## 2012-11-13 ENCOUNTER — Other Ambulatory Visit (INDEPENDENT_AMBULATORY_CARE_PROVIDER_SITE_OTHER): Payer: Medicare Other

## 2012-11-13 DIAGNOSIS — E876 Hypokalemia: Secondary | ICD-10-CM

## 2012-11-13 LAB — POTASSIUM: Potassium: 3.6 mEq/L (ref 3.5–5.1)

## 2012-11-14 ENCOUNTER — Encounter: Payer: Self-pay | Admitting: *Deleted

## 2012-11-24 ENCOUNTER — Other Ambulatory Visit: Payer: Self-pay | Admitting: Family Medicine

## 2012-11-27 ENCOUNTER — Encounter: Payer: Self-pay | Admitting: Family Medicine

## 2012-11-27 NOTE — Telephone Encounter (Signed)
Rx sent to the pharmacy by e-script.//AB/CMA 

## 2012-12-05 ENCOUNTER — Encounter: Payer: Self-pay | Admitting: Family Medicine

## 2012-12-05 ENCOUNTER — Ambulatory Visit (INDEPENDENT_AMBULATORY_CARE_PROVIDER_SITE_OTHER): Payer: Medicare Other | Admitting: Family Medicine

## 2012-12-05 VITALS — BP 108/70 | HR 71 | Temp 97.4°F | Ht 62.75 in | Wt 127.4 lb

## 2012-12-05 DIAGNOSIS — R634 Abnormal weight loss: Secondary | ICD-10-CM

## 2012-12-05 LAB — BASIC METABOLIC PANEL
CO2: 24 mEq/L (ref 19–32)
Chloride: 108 mEq/L (ref 96–112)
Creatinine, Ser: 0.6 mg/dL (ref 0.4–1.2)
Glucose, Bld: 102 mg/dL — ABNORMAL HIGH (ref 70–99)

## 2012-12-05 LAB — CBC WITH DIFFERENTIAL/PLATELET
Basophils Absolute: 0 10*3/uL (ref 0.0–0.1)
Basophils Relative: 0.7 % (ref 0.0–3.0)
Eosinophils Relative: 6.7 % — ABNORMAL HIGH (ref 0.0–5.0)
HCT: 36.4 % (ref 36.0–46.0)
Hemoglobin: 12 g/dL (ref 12.0–15.0)
Lymphocytes Relative: 32.1 % (ref 12.0–46.0)
Monocytes Relative: 10.2 % (ref 3.0–12.0)
Neutro Abs: 2.4 10*3/uL (ref 1.4–7.7)
RBC: 3.89 Mil/uL (ref 3.87–5.11)
WBC: 4.8 10*3/uL (ref 4.5–10.5)

## 2012-12-05 NOTE — Patient Instructions (Addendum)
Follow up in 6-8 weeks to recheck weight We'll notify you of your lab results and make any changes if needed Make sure you are eating something during your day- a protein shake, protein bar, snack, etc- to keep your energy level up Call with any questions or concerns Hang in there!!!

## 2012-12-05 NOTE — Assessment & Plan Note (Signed)
New.  Pt's weight has been stable x2 months but did lose 10 lbs in the 6 months prior to that.  Check labs to r/o metabolic cause.  Encouraged pt to eat regularly to sustain her activity level.  Will monitor weight closely.  Reviewed supportive care and red flags that should prompt return.  Pt expressed understanding and is in agreement w/ plan.

## 2012-12-05 NOTE — Progress Notes (Signed)
  Subjective:    Patient ID: Amanda Jackson, female    DOB: [birth year], 72 y.o.   MRN: [id number]  HPI Weight loss- pt's weight has been stable since visit in June.  Has lost 10 lbs since CPE in 12/13.  Appetite has decreased.  Will eat wheat bread w/ PB in AM but not eat anything all day while at work and will eat something small when she gets home but 'i'm so tired after working all day'.  Pt denies excessive fatigue, SOB, CP, dizziness, edema, N/V/D.   Review of Systems For ROS see HPI     Objective:   Physical Exam  Vitals reviewed. Constitutional: She is oriented to person, place, and time. She appears well-developed and well-nourished. No distress.  HENT:  Head: Normocephalic and atraumatic.  Eyes: Conjunctivae and EOM are normal. Pupils are equal, round, and reactive to light.  Neck: Normal range of motion. Neck supple. No thyromegaly present.  Cardiovascular: Normal rate, regular rhythm, normal heart sounds and intact distal pulses.   No murmur heard. Pulmonary/Chest: Effort normal and breath sounds normal. No respiratory distress.  Abdominal: Soft. She exhibits no distension. There is no tenderness.  Musculoskeletal: She exhibits no edema.  Lymphadenopathy:    She has no cervical adenopathy.  Neurological: She is alert and oriented to person, place, and time.  Skin: Skin is warm and dry.  Psychiatric: She has a normal mood and affect. Her behavior is normal.          Assessment & Plan:

## 2012-12-06 NOTE — Progress Notes (Signed)
Left message on voice mail that labs looked good and there was no obvious cause for the weight loss.

## 2012-12-12 ENCOUNTER — Other Ambulatory Visit: Payer: Self-pay | Admitting: *Deleted

## 2012-12-12 MED ORDER — CARVEDILOL 12.5 MG PO TABS: ORAL_TABLET | ORAL | Status: DC

## 2013-01-04 ENCOUNTER — Encounter: Payer: Self-pay | Admitting: Family Medicine

## 2013-01-12 ENCOUNTER — Emergency Department (HOSPITAL_COMMUNITY)
Admission: EM | Admit: 2013-01-12 | Discharge: 2013-01-12 | Disposition: A | Discharge status: Home / Self Care | Payer: Medicare Other | Source: Home / Self Care

## 2013-01-12 ENCOUNTER — Encounter (HOSPITAL_COMMUNITY): Payer: Self-pay | Admitting: Emergency Medicine

## 2013-01-12 DIAGNOSIS — J309 Allergic rhinitis, unspecified: Secondary | ICD-10-CM

## 2013-01-12 DIAGNOSIS — J45901 Unspecified asthma with (acute) exacerbation: Secondary | ICD-10-CM

## 2013-01-12 MED ORDER — ALBUTEROL SULFATE (5 MG/ML) 0.5% IN NEBU
INHALATION_SOLUTION | RESPIRATORY_TRACT | Status: AC
  Filled 2013-01-12: qty 1

## 2013-01-12 MED ORDER — BECLOMETHASONE DIPROPIONATE 40 MCG/ACT IN AERS: 2.0000 | INHALATION_SPRAY | Freq: Two times a day (BID) | RESPIRATORY_TRACT | Status: DC

## 2013-01-12 MED ORDER — FLUTICASONE PROPIONATE 50 MCG/ACT NA SUSP: 2.0000 | Freq: Every day | NASAL | Status: DC

## 2013-01-12 MED ORDER — ALBUTEROL SULFATE (5 MG/ML) 0.5% IN NEBU
5.0000 mg | INHALATION_SOLUTION | Freq: Once | RESPIRATORY_TRACT | Status: AC
  Administered 2013-01-12: 5 mg via RESPIRATORY_TRACT

## 2013-01-12 MED ORDER — PREDNISONE 20 MG PO TABS: ORAL_TABLET | ORAL | Status: DC

## 2013-01-12 MED ORDER — TRIAMCINOLONE ACETONIDE 40 MG/ML IJ SUSP
20.0000 mg | Freq: Once | INTRAMUSCULAR | Status: AC
  Administered 2013-01-12: 20 mg via INTRAMUSCULAR

## 2013-01-12 MED ORDER — IPRATROPIUM BROMIDE 0.02 % IN SOLN
0.5000 mg | Freq: Once | RESPIRATORY_TRACT | Status: AC
  Administered 2013-01-12: 0.5 mg via RESPIRATORY_TRACT

## 2013-01-12 MED ORDER — TRIAMCINOLONE ACETONIDE 40 MG/ML IJ SUSP
INTRAMUSCULAR | Status: AC
  Filled 2013-01-12: qty 1

## 2013-01-12 NOTE — ED Provider Notes (Signed)
CSN: [id number]     Arrival date & time 01/12/13  1841 History   First MD Initiated Contact with Patient 01/12/13 1933     Chief Complaint  Patient presents with  . Asthma    sob and cough. chest tightness. denies any other symptoms.    (Consider location/radiation/quality/duration/timing/severity/associated sxs/prior Treatment) HPI Comments: Pleasant 72 year old female with a history of asthma and allergic rhinitis presents with shortness of breath and cough that began approximately 3 days ago. [place] with a lot of dust. This seemed to have precipitated her symptoms. She is complaining of difficulty in breathing, cough, wheezing, PND and runny nose. She is using her albuterol HFA  with modest results.   Past Medical History  Diagnosis Date  . Hyperlipidemia     followed by Dr. Lenord Fellers  . Hypertension   . Atypical chest pain     Normal Myoview 2008  . Seasonal allergies   . Hx of colonoscopy 03/18/09  . Anxiety   . MVP (mitral valve prolapse)   . Asthma   . Tubulovillous adenoma   . Arthritis   . Colon polyp    Past Surgical History  Procedure Laterality Date  . Laparoscopic right colectomy  01/25/03  . Knee arthroscopy w/ lateral release  11/17/99    Left  . Knee arthroscopy w/ partial medial meniscectomy  11/17/99    left  . Functional endoscopic sinus surgery  08/28/08    with bilateral ethmoidectomies and bilateral maxillary sinus ostial enlargement with removal of mucocyst, mucous membrane, and mucoid material  . Abdominal hysterectomy    . Breast surgery  2010  . Tonsillectomy  1952   Family History  Problem Relation Age of Onset  . Heart murmur Son   . Hypertension Son   . Lung cancer Father   . Cancer Father   . Hypertension Father   . Liver cancer Mother   . Cancer Mother   . Hypertension Mother   . Cancer Sister     COLON   History  Substance Use Topics  . Smoking status: Former Smoker    Quit date:  09/09/1977  . Smokeless tobacco: Never Used     Comment: used to smoke socially. Quit in 1979.  Marland Kitchen Alcohol Use: Yes     Comment: usually has a glass of wine with dinner every evening   OB History   Grav Para Term Preterm Abortions TAB SAB Ect Mult Living   2 1   1     1      Review of Systems  Constitutional: Positive for activity change. Negative for fever, chills and fatigue.  HENT: Positive for rhinorrhea, neck stiffness and postnasal drip. Negative for facial swelling.   Respiratory: Positive for cough, shortness of breath and wheezing.   Cardiovascular: Negative.   Gastrointestinal: Negative.   Skin: Negative for rash.  Neurological: Negative.     Allergies  Review of patient's allergies indicates no known allergies.  Home Medications   Current Outpatient Rx  Name  Route  Sig  Dispense  Refill  . albuterol (PROVENTIL HFA) 108 (90 BASE) MCG/ACT inhaler      as needed.           Marland Kitchen amLODipine (NORVASC) 10 MG tablet   Oral   Take 1 tablet (10 mg total) by mouth 2 (two) times daily.   60 tablet   1     Needs an follow up appointment.   Marland Kitchen  aspirin 81 MG EC tablet   Oral   Take 81 mg by mouth daily.           . Calcium Carb-Cholecalciferol (CALCIUM 600+D3 PO)   Oral   Take by mouth 2 (two) times daily.         . carvedilol (COREG) 12.5 MG tablet      TAKE ONE TABLET BY MOUTH TWICE DAILY WITH A MEAL   60 tablet   1     MUST SCHEDULE FOLLOW UP APPOINTMENT FOR ADDITIONAL .Marland Kitchen.   . conjugated estrogens (PREMARIN) vaginal cream   Vaginal   Place 0.5 g vaginally 2 (two) times a week.         . folic acid-pyridoxine-cyancobalamin (FOLTX) 2.5-25-2 MG TABS      TAKE 1 TABLET BY MOUTH DAILY.   30 tablet   5   . guaiFENesin (MUCINEX) 600 MG 12 hr tablet   Oral   Take 600 mg by mouth daily.           Marland Kitchen ibuprofen (ADVIL,MOTRIN) 800 MG tablet   Oral   Take 1 tablet (800 mg total) by mouth 3 (three) times daily as needed for pain.   90 tablet   3   .  LORazepam (ATIVAN) 0.5 MG tablet      TAKE 1 TABLET TWICE A DAY AS NEEDED   90 tablet   3   . losartan-hydrochlorothiazide (HYZAAR) 100-25 MG per tablet   Oral   Take 1 tablet by mouth daily.   30 tablet   4   . mometasone (ASMANEX 60 METERED DOSES) 220 MCG/INH inhaler   Inhalation   Inhale 2 puffs into the lungs daily.           . mometasone (NASONEX) 50 MCG/ACT nasal spray   Nasal   2 sprays by Nasal route 2 (two) times daily.           . montelukast (SINGULAIR) 10 MG tablet   Oral   Take 10 mg by mouth daily.           . Multiple Vitamins-Minerals (CENTRUM) tablet   Oral   Take 1 tablet by mouth daily.           Marland Kitchen omeprazole (PRILOSEC) 20 MG capsule      TAKE 1 CAPSULE TWICE A DAY   60 capsule   5   . potassium chloride SA (KLOR-CON M20) 20 MEQ tablet      TAKE 2 TALBETS BY MOUTH 3 TIMES A DAY   180 tablet   5   . traMADol (ULTRAM) 50 MG tablet   Oral   Take 50 mg by mouth every 4 (four) hours.         . beclomethasone (QVAR) 40 MCG/ACT inhaler   Inhalation   Inhale 2 puffs into the lungs 2 (two) times daily.   1 Inhaler   12   . fluticasone (FLONASE) 50 MCG/ACT nasal spray   Nasal   Place 2 sprays into the nose daily.   16 g   2   . nitroGLYCERIN (NITROSTAT) 0.4 MG SL tablet      As Directed   30 tablet   0   . predniSONE (DELTASONE) 20 MG tablet      2 tabs po once daily x 3 days, 1 tablet q d for 3 d. Take with food.   9 tablet   0    BP 121/58  Pulse 78  Temp(Src) 97.5 F (  36.4 C) (Oral)  SpO2 99% Physical Exam  Nursing note and vitals reviewed. Constitutional: She is oriented to person, place, and time. She appears well-developed and well-nourished. No distress.  HENT:  Mouth/Throat: No oropharyngeal exudate.  Bilateral TMs are normal Oropharynx with minor erythema and clear PND. Mild injection most likely from coughing.  Eyes: Conjunctivae and EOM are normal.  Neck: Neck supple.  Chronic stiffness in the neck with  musculoskeletal etiology.  Cardiovascular: Normal rate, regular rhythm and normal heart sounds.   Pulmonary/Chest: Effort normal. No respiratory distress. She has wheezes. She has no rales.  Diffuse bilateral expiratory wheezes with prolonged expiratory phase.   Musculoskeletal: Normal range of motion. She exhibits no edema.  Lymphadenopathy:    She has no cervical adenopathy.  Neurological: She is alert and oriented to person, place, and time.  Skin: Skin is warm and dry. No rash noted.  Psychiatric: She has a normal mood and affect.    ED Course  Procedures (including critical care time) Labs Review Labs Reviewed - No data to display Imaging Review No results found.  MDM   1. Asthma exacerbation, allergic, mild intermittent   2. Allergic rhinitis due to allergen       Post duo neb the patient states she is breathing much better. Her lungs are now clear with good chest expansion, no adventitious sounds and expiratory equals inspiratory. The patient states that she is ready to go home. Patient is discharged in a stable and improved condition. Kenalog 20 mg IM nail will followup tomorrow with the start of prednisone tablets. We will also prescribe Qvar 40 mg twice a day. She is advised to take either Allegra one 80 mg of Claritin 10 mg daily.  Hayden Rasmussen, NP 01/12/13 2035

## 2013-01-12 NOTE — ED Notes (Signed)
C/o persistent cough. Nonproductive. Chest tightness with sob. Hx of asthma.  Pt has used inhalers with no relief. Denies any other symptoms.

## 2013-01-14 NOTE — ED Provider Notes (Signed)
Medical screening examination/treatment/procedure(s) were performed by a resident physician or non-physician practitioner and as the supervising physician I was immediately available for consultation/collaboration.  Clementeen Graham, MD    Rodolph Bong, MD 01/14/13 [phone]

## 2013-01-31 ENCOUNTER — Ambulatory Visit: Payer: Medicare Other | Admitting: Family Medicine

## 2013-02-01 ENCOUNTER — Ambulatory Visit (INDEPENDENT_AMBULATORY_CARE_PROVIDER_SITE_OTHER): Payer: Medicare Other | Admitting: Family Medicine

## 2013-02-01 ENCOUNTER — Encounter: Payer: Self-pay | Admitting: Family Medicine

## 2013-02-01 VITALS — BP 110/74 | HR 64 | Temp 98.0°F | Resp 16 | Wt 125.0 lb

## 2013-02-01 DIAGNOSIS — R634 Abnormal weight loss: Secondary | ICD-10-CM

## 2013-02-01 DIAGNOSIS — Z23 Encounter for immunization: Secondary | ICD-10-CM

## 2013-02-01 NOTE — Patient Instructions (Signed)
Schedule your complete physical in 3 months You look great!  Keep up the good work!!! Call with any questions or concerns Happy Early [place] Ouch!!!

## 2013-02-01 NOTE — Progress Notes (Signed)
  Subjective:    Patient ID: Amanda Jackson, female    DOB: Aug 19, 1940, 72 y.o.   MRN: [id number]  HPI Weight loss- pt was 127 in June 2014.  Down 2 lbs today.  Pt reports she is eating well and feels that she's regaining some of the lost weight.  Feeling well.  Denies current concerns.   Review of Systems For ROS see HPI     Objective:   Physical Exam  Vitals reviewed. Constitutional: She is oriented to person, place, and time. She appears well-developed and well-nourished. No distress.  HENT:  Head: Normocephalic and atraumatic.  Eyes: Conjunctivae and EOM are normal. Pupils are equal, round, and reactive to light.  Neck: Normal range of motion. Neck supple. No thyromegaly present.  Cardiovascular: Normal rate, regular rhythm, normal heart sounds and intact distal pulses.   No murmur heard. Pulmonary/Chest: Effort normal and breath sounds normal. No respiratory distress.  Abdominal: Soft. She exhibits no distension. There is no tenderness.  Musculoskeletal: She exhibits no edema.  Lymphadenopathy:    She has no cervical adenopathy.  Neurological: She is alert and oriented to person, place, and time.  Skin: Skin is warm and dry.  Psychiatric: She has a normal mood and affect. Her behavior is normal.          Assessment & Plan:

## 2013-02-01 NOTE — Assessment & Plan Note (Signed)
Weight is stable- only 2 lb loss in 4 months.  Eating well.  Pt has actually regained weight according to what she dropped to previously.  Will continue to follow.

## 2013-02-21 ENCOUNTER — Other Ambulatory Visit: Payer: Self-pay

## 2013-02-21 MED ORDER — CARVEDILOL 12.5 MG PO TABS: ORAL_TABLET | ORAL | Status: DC

## 2013-03-09 LAB — HM MAMMOGRAPHY: HM Mammogram: NORMAL

## 2013-03-15 ENCOUNTER — Encounter: Payer: Self-pay | Admitting: General Practice

## 2013-03-23 ENCOUNTER — Other Ambulatory Visit: Payer: Self-pay

## 2013-03-23 MED ORDER — CARVEDILOL 12.5 MG PO TABS: ORAL_TABLET | ORAL | Status: DC

## 2013-04-13 ENCOUNTER — Encounter: Payer: Medicare Other | Admitting: Family Medicine

## 2013-04-25 ENCOUNTER — Other Ambulatory Visit: Payer: Self-pay | Admitting: Family Medicine

## 2013-04-25 NOTE — Telephone Encounter (Signed)
Last OV 02-01-13 Ativan filled 11-02-12 #90 with 3 Folvite 10-02-12 #30 with 5

## 2013-05-04 ENCOUNTER — Other Ambulatory Visit: Payer: Self-pay | Admitting: General Practice

## 2013-05-04 DIAGNOSIS — M79609 Pain in unspecified limb: Secondary | ICD-10-CM

## 2013-05-04 MED ORDER — IBUPROFEN 800 MG PO TABS: 800.0000 mg | ORAL_TABLET | Freq: Three times a day (TID) | ORAL | Status: DC | PRN

## 2013-05-07 ENCOUNTER — Telehealth: Payer: Self-pay

## 2013-05-07 NOTE — Telephone Encounter (Signed)
Patient confirmed will be at appt. On the way to work, unable to talk.  Flu vaccine--01/2013 [facility]--02/2013--no report scanned yet Dexa--02/2012--[facility]--next due 2018 [facility]--03/2011--Dr Brodie--polyps--next due end of 2015

## 2013-05-08 ENCOUNTER — Ambulatory Visit (INDEPENDENT_AMBULATORY_CARE_PROVIDER_SITE_OTHER): Payer: Medicare Other | Admitting: Family Medicine

## 2013-05-08 ENCOUNTER — Encounter: Payer: Self-pay | Admitting: Family Medicine

## 2013-05-08 VITALS — BP 112/78 | HR 73 | Temp 97.5°F | Resp 16 | Ht 61.5 in | Wt 125.1 lb

## 2013-05-08 DIAGNOSIS — I10 Essential (primary) hypertension: Secondary | ICD-10-CM

## 2013-05-08 DIAGNOSIS — J4530 Mild persistent asthma, uncomplicated: Secondary | ICD-10-CM | POA: Insufficient documentation

## 2013-05-08 DIAGNOSIS — R7989 Other specified abnormal findings of blood chemistry: Secondary | ICD-10-CM

## 2013-05-08 DIAGNOSIS — R946 Abnormal results of thyroid function studies: Secondary | ICD-10-CM

## 2013-05-08 DIAGNOSIS — Z0000 Encounter for general adult medical examination without abnormal findings: Secondary | ICD-10-CM

## 2013-05-08 DIAGNOSIS — Z23 Encounter for immunization: Secondary | ICD-10-CM

## 2013-05-08 DIAGNOSIS — J45909 Unspecified asthma, uncomplicated: Secondary | ICD-10-CM

## 2013-05-08 LAB — BASIC METABOLIC PANEL
BUN: 22 mg/dL (ref 6–23)
CO2: 24 mEq/L (ref 19–32)
Calcium: 10.1 mg/dL (ref 8.4–10.5)
Chloride: 108 mEq/L (ref 96–112)
Creatinine, Ser: 0.7 mg/dL (ref 0.4–1.2)
GFR: 99.17 mL/min (ref >60.00)
Glucose, Bld: 98 mg/dL (ref 70–99)
POTASSIUM: 4.1 meq/L (ref 3.5–5.1)
SODIUM: 141 meq/L (ref 135–145)

## 2013-05-08 LAB — CBC WITH DIFFERENTIAL/PLATELET
Basophils Absolute: 0 10*3/uL (ref 0.0–0.1)
Basophils Relative: 0.7 % (ref 0.0–3.0)
Eosinophils Absolute: 0.3 10*3/uL (ref 0.0–0.7)
Eosinophils Relative: 6.6 % — ABNORMAL HIGH (ref 0.0–5.0)
HCT: 34.9 % — ABNORMAL LOW (ref 36.0–46.0)
Hemoglobin: 11.8 g/dL — ABNORMAL LOW (ref 12.0–15.0)
Lymphocytes Relative: 37.1 % (ref 12.0–46.0)
Lymphs Abs: 1.4 10*3/uL (ref 0.7–4.0)
MCHC: 33.9 g/dL (ref 30.0–36.0)
MCV: 93.3 fl (ref 78.0–100.0)
MONO ABS: 0.4 10*3/uL (ref 0.1–1.0)
Monocytes Relative: 10.8 % (ref 3.0–12.0)
Neutro Abs: 1.7 10*3/uL (ref 1.4–7.7)
Neutrophils Relative %: 44.8 % (ref 43.0–77.0)
PLATELETS: 247 10*3/uL (ref 150.0–400.0)
RBC: 3.74 Mil/uL — ABNORMAL LOW (ref 3.87–5.11)
RDW: 13.2 % (ref 11.5–14.6)
WBC: 3.9 10*3/uL — AB (ref 4.5–10.5)

## 2013-05-08 LAB — HEPATIC FUNCTION PANEL
ALK PHOS: 70 U/L (ref 39–117)
ALT: 21 U/L (ref 0–35)
AST: 23 U/L (ref 0–37)
Albumin: 4.3 g/dL (ref 3.5–5.2)
BILIRUBIN DIRECT: 0 mg/dL (ref 0.0–0.3)
Total Bilirubin: 0.5 mg/dL (ref 0.3–1.2)
Total Protein: 7.3 g/dL (ref 6.0–8.3)

## 2013-05-08 LAB — LIPID PANEL
CHOL/HDL RATIO: 2
Cholesterol: 159 mg/dL (ref 0–200)
HDL: 65.6 mg/dL (ref >39.00)
LDL Cholesterol: 77 mg/dL (ref 0–99)
Triglycerides: 81 mg/dL (ref 0.0–149.0)
VLDL: 16.2 mg/dL (ref 0.0–40.0)

## 2013-05-08 LAB — TSH: TSH: 0.45 u[IU]/mL (ref 0.35–5.50)

## 2013-05-08 NOTE — Assessment & Plan Note (Signed)
Chronic problem, following w/ Dr Shaune Leeks.  Pt feels sxs are well controlled.  No complaints at this time.

## 2013-05-08 NOTE — Progress Notes (Signed)
Pre visit review using our clinic review tool, if applicable. No additional management support is needed unless otherwise documented below in the visit note. 

## 2013-05-08 NOTE — Progress Notes (Signed)
   Subjective:    Patient ID: Amanda Jackson, female    DOB: 1940-06-07, 73 y.o.   MRN: [id number]  HPI Here today for CPE.  Risk Factors: HTN- chronic problem, on Norvasc, Hyzaar.  Excellent control.  No CP, SOB, HAs, visual changes, edema Asthma- chronic problem, on Asmanex, albuterol prn, singulair.  Following w/ Dr Shaune Leeks Abnormal TSH- hx of this, + fatigue but is working in high stress job. 'lump ridge on the back of my head'- not painful or tender, noted in August. Physical Activity: very active Fall Risk: low risk Depression: no current sxs Hearing: normal to conversational tones and whispered voice at 6 ft ADL's: independent Cognitive: normal linear thought process, memory and attention Home Safety: safe at home, lives w/ a companion Height, Weight, BMI, Visual Acuity: see vitals, vision corrected to 20/20 w/ glasses Counseling: UTD on colonoscopy, mammo, DEXA.  No need for paps. Labs Ordered: See A&P Care Plan: See A&P    Review of Systems Patient reports no vision/ hearing changes, adenopathy,fever, weight change,  persistant/recurrent hoarseness , swallowing issues, chest pain, palpitations, edema, persistant/recurrent cough, hemoptysis, dyspnea (rest/exertional/paroxysmal nocturnal), gastrointestinal bleeding (melena, rectal bleeding), abdominal pain, significant heartburn, bowel changes, GU symptoms (dysuria, hematuria, incontinence), Gyn symptoms (abnormal  bleeding, pain),  syncope, focal weakness, memory loss, numbness & tingling, skin/hair/nail changes, abnormal bruising or bleeding, anxiety, or depression.     Objective:   Physical Exam General Appearance:    Alert, cooperative, no distress, appears stated age  Head:    Normocephalic, without obvious abnormality, atraumatic  Eyes:    PERRL, conjunctiva/corneas clear, EOM's intact, fundi    benign, both eyes  Ears:    Normal TM's and external ear canals, both ears  Nose:   Nares normal, septum midline, mucosa  normal, no drainage    or sinus tenderness  Throat:   Lips, mucosa, and tongue normal; teeth and gums normal  Neck:   Supple, symmetrical, trachea midline, no adenopathy;    Thyroid: no enlargement/tenderness/nodules  Back:     Symmetric, no curvature, ROM normal, no CVA tenderness  Lungs:     Clear to auscultation bilaterally, respirations unlabored  Chest Wall:    No tenderness or deformity   Heart:    Regular rate and rhythm, S1 and S2 normal, no murmur, rub   or gallop  Breast Exam:    Deferred to mammo  Abdomen:     Soft, non-tender, bowel sounds active all four quadrants,    no masses, no organomegaly  Genitalia:    Deferred  Rectal:    Extremities:   Extremities normal, atraumatic, no cyanosis or edema  Pulses:   2+ and symmetric all extremities  Skin:   Skin color, texture, turgor normal, no rashes or lesions  Lymph nodes:   Cervical, supraclavicular, and axillary nodes normal  Neurologic:   CNII-XII intact, normal strength, sensation and reflexes    throughout          Assessment & Plan:

## 2013-05-08 NOTE — Assessment & Plan Note (Signed)
Chronic problem.  Excellent control.  Asymptomatic.  Check labs.  No anticipated changes.

## 2013-05-08 NOTE — Assessment & Plan Note (Signed)
Pt's PE WNL.  UTD on DEXA, mammo, colonoscopy.  No need for paps.  Pneumovax given today.  Check labs.  Anticipatory guidance provided.

## 2013-05-08 NOTE — Patient Instructions (Signed)
Follow up in 6 months to recheck BP- sooner if needed We'll notify you of your lab results and make any changes if needed Keep up the good work!  You look great! Call with any questions or concerns Happy New Year!!!

## 2013-05-08 NOTE — Assessment & Plan Note (Signed)
Pt has hx of this.  Currently asymptomatic.  Check labs.  Start meds/tx prn.

## 2013-05-09 ENCOUNTER — Encounter: Payer: Self-pay | Admitting: General Practice

## 2013-05-14 ENCOUNTER — Other Ambulatory Visit: Payer: Self-pay | Admitting: Family Medicine

## 2013-05-15 NOTE — Telephone Encounter (Signed)
Med filled.  

## 2013-05-22 ENCOUNTER — Other Ambulatory Visit: Payer: Self-pay

## 2013-05-22 MED ORDER — CARVEDILOL 12.5 MG PO TABS: ORAL_TABLET | ORAL | Status: DC

## 2013-05-29 ENCOUNTER — Other Ambulatory Visit: Payer: Self-pay | Admitting: Family Medicine

## 2013-05-29 NOTE — Telephone Encounter (Signed)
Med filled.  

## 2013-05-30 ENCOUNTER — Telehealth: Payer: Self-pay | Admitting: Family Medicine

## 2013-05-30 NOTE — Telephone Encounter (Signed)
Relevant patient education mailed to patient.  

## 2013-06-01 ENCOUNTER — Other Ambulatory Visit: Payer: Self-pay | Admitting: Family Medicine

## 2013-06-01 NOTE — Telephone Encounter (Signed)
Med filled.  

## 2013-06-04 ENCOUNTER — Encounter: Payer: Self-pay | Admitting: *Deleted

## 2013-06-05 ENCOUNTER — Encounter: Payer: Self-pay | Admitting: Cardiology

## 2013-06-05 ENCOUNTER — Ambulatory Visit (INDEPENDENT_AMBULATORY_CARE_PROVIDER_SITE_OTHER): Payer: Medicare Other | Admitting: Cardiology

## 2013-06-05 VITALS — BP 130/68 | HR 60 | Ht 61.5 in | Wt 121.0 lb

## 2013-06-05 DIAGNOSIS — R0789 Other chest pain: Secondary | ICD-10-CM

## 2013-06-05 MED ORDER — CARVEDILOL 12.5 MG PO TABS: ORAL_TABLET | ORAL | Status: DC

## 2013-06-05 MED ORDER — LOSARTAN POTASSIUM-HCTZ 100-25 MG PO TABS: 1.0000 | ORAL_TABLET | Freq: Every day | ORAL | Status: DC

## 2013-06-05 MED ORDER — AMLODIPINE BESYLATE 10 MG PO TABS: 10.0000 mg | ORAL_TABLET | Freq: Two times a day (BID) | ORAL | Status: DC

## 2013-06-05 NOTE — Patient Instructions (Signed)
Your physician recommends that you continue on your current medications as directed. Please refer to the Current Medication list given to you today.  Your physician wants you to follow-up in: 1 year. You will receive a reminder letter in the mail two months in advance. If you don't receive a letter, please call our office to schedule the follow-up appointment.  

## 2013-06-05 NOTE — Progress Notes (Signed)
Patient ID: Amanda Jackson, female   DOB: 1940/12/06, 73 y.o.   MRN: [id number]    HPI: Amanda Jackson is a delightful 73 year old woman history of hypertension and atypical chest pain with a normal Myoview in 2008.  She also has seasonal allergies.  She presents today for routine followup.  She is doing very well.  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 She denies any shortness of breath, she experiences only occasional sharp chest pains that are relieved by walking around and taking sublingual nitroglycerin. She states that she experiences those mostly with emotional stress and when she relieves the tension the pain resolves. These episodes are rare and they are same in character as when she had her stress test that was negative for ischemia. 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 She is very compliant with her meds.    Lab Results  Component Value Date   CHOL 159 05/08/2013   HDL 65.60 05/08/2013   LDLCALC 77 05/08/2013   TRIG 81.0 05/08/2013   CHOLHDL 2 05/08/2013     ROS: All systems negative except as listed in HPI, PMH and Problem List.  Past Medical History  Diagnosis Date  . Hyperlipidemia     followed by Dr. Renold Genta  . Hypertension   . Atypical chest pain     Normal Myoview 2008  . Seasonal allergies   . Hx of colonoscopy 03/18/09  . Anxiety   . MVP (mitral valve prolapse)   . Asthma   . Tubulovillous adenoma   . Arthritis   . Colon polyp     Current Outpatient Prescriptions  Medication Sig Dispense Refill  . albuterol (PROVENTIL HFA) 108 (90 BASE) MCG/ACT inhaler as needed.        Marland Kitchen amLODipine (NORVASC) 10 MG tablet Take 1 tablet (10 mg total) by mouth 2 (two) times daily.  60 tablet  1  . aspirin 81 MG EC tablet Take 81 mg by mouth daily.        . Calcium  Carb-Cholecalciferol (CALCIUM 600+D3 PO) Take by mouth 2 (two) times daily.      . carvedilol (COREG) 12.5 MG tablet TAKE ONE TABLET BY MOUTH TWICE DAILY WITH A MEAL  30 tablet  0  . conjugated estrogens (PREMARIN) vaginal cream Place 0.5 g vaginally 2 (two) times a week.      Marland Kitchen FA-Pyridoxine-Cyancobalamin (FOLBIC PO) Take by mouth daily.      Marland Kitchen guaiFENesin (MUCINEX) 600 MG 12 hr tablet Take 600 mg by mouth daily.        Marland Kitchen ibuprofen (ADVIL,MOTRIN) 800 MG tablet Take 1 tablet (800 mg total) by mouth 3 (three) times daily as needed.  90 tablet  3  . KLOR-CON M20 20 MEQ tablet TAKE 2 TALBETS BY MOUTH 3 TIMES A DAY  180 tablet  5  . LORazepam (ATIVAN) 0.5 MG tablet TAKE 1 TABLET BY MOUTH TWICE A DAY EVERY DAY  90 tablet  3  . losartan-hydrochlorothiazide (HYZAAR) 100-25 MG per tablet TAKE 1 TABLET BY MOUTH DAILY.  30 tablet  4  . mometasone (ASMANEX 60 METERED DOSES) 220 MCG/INH inhaler Inhale 2 puffs into the lungs daily.        . mometasone (NASONEX) 50 MCG/ACT nasal spray 2 sprays  by Nasal route 2 (two) times daily.        . montelukast (SINGULAIR) 10 MG tablet Take 10 mg by mouth daily.        . Multiple Vitamins-Minerals (CENTRUM) tablet Take 1 tablet by mouth daily.        Marland Kitchen NITROSTAT 0.4 MG SL tablet AS DIRECTED  25 tablet  0  . omeprazole (PRILOSEC) 20 MG capsule TAKE 1 CAPSULE TWICE A DAY  60 capsule  5   No current facility-administered medications for this visit.    PHYSICAL EXAM: Filed Vitals:   06/05/13 1419  BP: 130/68  Pulse: 60   General:  Well appearing. No resp difficulty HEENT: normal Neck: supple. JVP flat. Carotids 2+ bilaterally; no bruits. No lymphadenopathy or thryomegaly appreciated. Cor: PMI normal. Regular rate & rhythm. No rubs, gallops or murmurs. Lungs: clear Abdomen: soft, nontender, nondistended. No hepatosplenomegaly. No bruits or masses. Good bowel sounds. Extremities: no cyanosis, clubbing, rash, edema. Left knee swelling  Neuro: alert & orientedx3,  cranial nerves grossly intact. Moves all 4 extremities w/o difficulty. Affect pleasant.  ECG: NSR 70 No ST-T wave abnormalities.    ASSESSMENT & PLAN:  A very pleasant 73 year old female  1.   Hypertension - well controlled on current regimen she recently had all the labs drawn and they were all normal including kidney function electrolytes and liver function tests.  2. atypical chest pain - negative ischemia workup in 2008, unchanged symptoms patient very functional. No further workup necessary at this point.  3. Lipids - checked last month and a bad cough.  The patient is considered a low risk for moderate risk surgery such as knee replacement. At this point there is no contraindication from cardiovascular standpoint for this patient to undergo her surgery. We'll recommend for her to continue her medications during the perioperative period.  Ena Dawley, H 06/05/2013

## 2013-06-05 NOTE — Addendum Note (Signed)
**Note De-Identified [place] [facility]ki Obfuscation** Addended by: Dennie Fetters on: 06/05/2013 04:27 PM   Modules accepted: Orders

## 2013-09-11 ENCOUNTER — Other Ambulatory Visit: Payer: Self-pay | Admitting: Family Medicine

## 2013-09-11 NOTE — Telephone Encounter (Signed)
Med filled.  

## 2013-10-30 ENCOUNTER — Other Ambulatory Visit: Payer: Self-pay | Admitting: Family Medicine

## 2013-10-30 NOTE — Telephone Encounter (Signed)
Last OV 05-08-13 Med filled 04-25-13 #90 with 0

## 2013-10-30 NOTE — Telephone Encounter (Signed)
Med filled and faxed.  

## 2013-11-03 ENCOUNTER — Other Ambulatory Visit: Payer: Self-pay | Admitting: Family Medicine

## 2013-11-05 ENCOUNTER — Ambulatory Visit (INDEPENDENT_AMBULATORY_CARE_PROVIDER_SITE_OTHER): Payer: Medicare Other | Admitting: Family Medicine

## 2013-11-05 ENCOUNTER — Encounter: Payer: Self-pay | Admitting: Family Medicine

## 2013-11-05 VITALS — BP 120/72 | HR 73 | Temp 98.0°F | Resp 16 | Wt 126.1 lb

## 2013-11-05 DIAGNOSIS — I10 Essential (primary) hypertension: Secondary | ICD-10-CM

## 2013-11-05 DIAGNOSIS — M79606 Pain in leg, unspecified: Secondary | ICD-10-CM | POA: Insufficient documentation

## 2013-11-05 DIAGNOSIS — M79609 Pain in unspecified limb: Secondary | ICD-10-CM

## 2013-11-05 DIAGNOSIS — M79604 Pain in right leg: Secondary | ICD-10-CM

## 2013-11-05 NOTE — Assessment & Plan Note (Signed)
Chronic problem.  Well controlled.  Asymptomatic.  Check labs.  No anticipated med changes. 

## 2013-11-05 NOTE — Progress Notes (Signed)
   Subjective:    Patient ID: Amanda Jackson, female    DOB: 1941-02-10, 73 y.o.   MRN: [id number]  HPI HTN- chronic problem, well controlled on Losartan-HCTZ, Amlodipine, Carvedilol.  Denies CP, SOB above baseline (known asthma), HAs, visual changes, edema.  'excrutiating pain' behind R knee- sxs started w/ 'slight numbness' in R leg 'mostly at night'.  Has known knee arthritis and is considering surgery w/ Dr Latanya Maudlin.  Pain can be at any time, now interfering w/ walking.  sxs started 4-5 days ago.  No swelling or redness.   Review of Systems For ROS see HPI     Objective:   Physical Exam  Vitals reviewed. Constitutional: She is oriented to person, place, and time. She appears well-developed and well-nourished. No distress.  HENT:  Head: Normocephalic and atraumatic.  Eyes: Conjunctivae and EOM are normal. Pupils are equal, round, and reactive to light.  Neck: Normal range of motion. Neck supple. No thyromegaly present.  Cardiovascular: Normal rate, regular rhythm, normal heart sounds and intact distal pulses.   No murmur heard. Pulmonary/Chest: Effort normal and breath sounds normal. No respiratory distress.  Abdominal: Soft. She exhibits no distension. There is no tenderness.  Musculoskeletal: She exhibits tenderness (mild TTP posterior to R knee w/ some fullness noted). She exhibits no edema.  Lymphadenopathy:    She has no cervical adenopathy.  Neurological: She is alert and oriented to person, place, and time.  Skin: Skin is warm and dry. No erythema.  Psychiatric: She has a normal mood and affect. Her behavior is normal.          Assessment & Plan:

## 2013-11-05 NOTE — Assessment & Plan Note (Signed)
New.  Pt w/o obvious LE edema, redness.  Some fullness posterior to R knee.  Get venous doppler to assess and r/o DVT.  Once doppler results available, will determine next steps.

## 2013-11-05 NOTE — Progress Notes (Signed)
Pre visit review using our clinic review tool, if applicable. No additional management support is needed unless otherwise documented below in the visit note. 

## 2013-11-05 NOTE — Patient Instructions (Signed)
Schedule your complete physical in 6 months We'll notify you of your lab results and make any changes if needed ICE and elevate your leg as much as possible while sitting We'll call you with your Ultrasound appt and results Call with any questions or concerns Enjoy the rest of your summer!

## 2013-11-05 NOTE — Telephone Encounter (Signed)
Med filled on 7/7 confirmed by pharmacy.

## 2013-11-07 ENCOUNTER — Ambulatory Visit (HOSPITAL_BASED_OUTPATIENT_CLINIC_OR_DEPARTMENT_OTHER): Admission: RE | Admit: 2013-11-07 | Payer: Medicare Other | Source: Ambulatory Visit

## 2013-11-07 ENCOUNTER — Ambulatory Visit
Admission: RE | Admit: 2013-11-07 | Discharge: 2013-11-07 | Disposition: A | Discharge status: Home / Self Care | Payer: Medicare Other | Source: Ambulatory Visit | Attending: Family Medicine | Admitting: Family Medicine

## 2013-11-07 DIAGNOSIS — M79604 Pain in right leg: Secondary | ICD-10-CM

## 2013-11-16 ENCOUNTER — Encounter (HOSPITAL_COMMUNITY): Payer: Self-pay | Admitting: Emergency Medicine

## 2013-11-16 ENCOUNTER — Emergency Department (HOSPITAL_COMMUNITY)
Admission: EM | Admit: 2013-11-16 | Discharge: 2013-11-16 | Disposition: A | Discharge status: Home / Self Care | Payer: Medicare Other | Source: Home / Self Care

## 2013-11-16 DIAGNOSIS — T63441A Toxic effect of venom of bees, accidental (unintentional), initial encounter: Secondary | ICD-10-CM

## 2013-11-16 DIAGNOSIS — T6391XA Toxic effect of contact with unspecified venomous animal, accidental (unintentional), initial encounter: Secondary | ICD-10-CM

## 2013-11-16 DIAGNOSIS — T63461A Toxic effect of venom of wasps, accidental (unintentional), initial encounter: Secondary | ICD-10-CM

## 2013-11-16 NOTE — ED Provider Notes (Signed)
CSN: [id number]     Arrival date & time 11/16/13  1533 History   First MD Initiated Contact with Patient 11/16/13 1712     Chief Complaint  Patient presents with  . Insect Bite   (Consider location/radiation/quality/duration/timing/severity/associated sxs/prior Treatment) HPI Comments: Bee sting 48 h ago. There are 3 localized areas of cutaneous redness to the right back . No systemic sx's.    Past Medical History  Diagnosis Date  . Hyperlipidemia     followed by Dr. Renold Genta  . Hypertension   . Atypical chest pain     Normal Myoview 2008  . Seasonal allergies   . Hx of colonoscopy 03/18/09  . Anxiety   . MVP (mitral valve prolapse)   . Asthma   . Tubulovillous adenoma   . Arthritis   . Colon polyp    Past Surgical History  Procedure Laterality Date  . Laparoscopic right colectomy  01/25/03  . Knee arthroscopy w/ lateral release  11/17/99    Left  . Knee arthroscopy w/ partial medial meniscectomy  11/17/99    left  . Functional endoscopic sinus surgery  08/28/08    with bilateral ethmoidectomies and bilateral maxillary sinus ostial enlargement with removal of mucocyst, mucous membrane, and mucoid material  . Abdominal hysterectomy    . Breast surgery  2010  . Tonsillectomy  1952   Family History  Problem Relation Age of Onset  . Heart murmur Son   . Hypertension Son   . Lung cancer Father   . Cancer Father   . Hypertension Father   . Liver cancer Mother   . Cancer Mother   . Hypertension Mother   . Colon cancer Sister    History  Substance Use Topics  . Smoking status: Former Smoker    Quit date: 09/09/1977  . Smokeless tobacco: Never Used     Comment: used to smoke socially. Quit in 1979.  Marland Kitchen Alcohol Use: Yes     Comment: usually has a glass of wine with dinner every evening   OB History   Grav Para Term Preterm Abortions TAB SAB Ect Mult Living   2 1   1     1      Review of Systems  Constitutional: Negative.   HENT: Negative.   Respiratory: Negative for  cough, chest tightness, shortness of breath and wheezing.   Cardiovascular: Negative for chest pain, palpitations and leg swelling.  Gastrointestinal: Negative.   Genitourinary: Negative.   Skin: Negative for rash.  Neurological: Negative for dizziness, light-headedness, numbness and headaches.  Psychiatric/Behavioral: Negative.   All other systems reviewed and are negative.   Allergies  Review of patient's allergies indicates no known allergies.  Home Medications   Prior to Admission medications   Medication Sig Start Date End Date Taking? Authorizing Provider  albuterol (PROVENTIL HFA) 108 (90 BASE) MCG/ACT inhaler as needed.      Historical Provider, MD  amLODipine (NORVASC) 10 MG tablet Take 1 tablet (10 mg total) by mouth 2 (two) times daily. 06/05/13   [name], MD  aspirin 81 MG EC tablet Take 81 mg by mouth daily.      Historical Provider, MD  Calcium Carb-Cholecalciferol (CALCIUM 600+D3 PO) Take by mouth 2 (two) times daily.    Historical Provider, MD  carvedilol (COREG) 12.5 MG tablet TAKE ONE TABLET BY MOUTH TWICE DAILY WITH A MEAL 06/05/13   [name], MD  conjugated estrogens (PREMARIN) vaginal cream Place 0.5 g vaginally 2 (two) times  a week.    Historical Provider, MD  FA-Pyridoxine-Cyancobalamin (FOLBIC PO) Take by mouth daily.    Historical Provider, MD  ibuprofen (ADVIL,MOTRIN) 800 MG tablet Take 1 tablet (800 mg total) by mouth 3 (three) times daily as needed. 05/04/13   Midge Minium, MD  KLOR-CON M20 20 MEQ tablet TAKE 2 TALBETS BY MOUTH 3 TIMES A DAY 06/01/13   Midge Minium, MD  LORazepam (ATIVAN) 0.5 MG tablet TAKE 1 TABLET TWICE A DAY 10/30/13   Midge Minium, MD  losartan-hydrochlorothiazide (HYZAAR) 100-25 MG per tablet Take 1 tablet by mouth daily. 06/05/13   [name], MD  mometasone (ASMANEX 60 METERED DOSES) 220 MCG/INH inhaler Inhale 2 puffs into the lungs daily.      Historical Provider, MD  mometasone (NASONEX) 50 MCG/ACT  nasal spray 2 sprays by Nasal route 2 (two) times daily.      Historical Provider, MD  montelukast (SINGULAIR) 10 MG tablet Take 10 mg by mouth daily.      Historical Provider, MD  Multiple Vitamins-Minerals (CENTRUM) tablet Take 1 tablet by mouth daily.      Historical Provider, MD  NITROSTAT 0.4 MG SL tablet AS DIRECTED 05/14/13   Midge Minium, MD  omeprazole (PRILOSEC) 20 MG capsule TAKE 1 CAPSULE TWICE A DAY 09/11/13   Midge Minium, MD   BP 118/73  Pulse 72  Temp(Src) 98.3 F (36.8 C) (Oral)  Resp 16  SpO2 97% Physical Exam  Nursing note and vitals reviewed. Constitutional: She is oriented to person, place, and time. She appears well-developed and well-nourished. No distress.  HENT:  Mouth/Throat: Oropharynx is clear and moist. No oropharyngeal exudate.  Eyes: Conjunctivae and EOM are normal.  Neck: Normal range of motion. Neck supple.  Cardiovascular: Normal rate.   Pulmonary/Chest: Effort normal and breath sounds normal.  Musculoskeletal: She exhibits no edema.  Lymphadenopathy:    She has no cervical adenopathy.  Neurological: She is alert and oriented to person, place, and time. No cranial nerve deficit.  Skin: Skin is warm and dry.  Psychiatric: She has a normal mood and affect.    ED Course  Procedures (including critical care time) Labs Review Labs Reviewed - No data to display  Imaging Review No results found.   MDM   1. Bee sting reaction, accidental or unintentional, initial encounter     Localized reaction only Ice, HC cream and benadryl cream as dir.    Janne Napoleon, NP 11/16/13 1726

## 2013-11-16 NOTE — ED Provider Notes (Signed)
Medical screening examination/treatment/procedure(s) were performed by a resident physician or non-physician practitioner and as the supervising physician I was immediately available for consultation/collaboration.  Linna Darner, MD Family Medicine   Waldemar Dickens, MD 11/16/13 [phone]

## 2013-11-16 NOTE — ED Notes (Signed)
States she was stung by something in her yard a couple of days ago late evening. At least 3 areas noted right flank area, red warm to touch. No evidence of retained stinger on inspection. NAD

## 2013-11-16 NOTE — Discharge Instructions (Signed)
Bee, Wasp, or Hornet Sting May use ice, hydrocortisone cream and benadryl cream to help relieve discomfort. Your caregiver has diagnosed you as having an insect sting. An insect sting appears as a red lump in the skin that sometimes has a tiny hole in the center, or it may have a stinger in the center of the wound. The most common stings are from wasps, hornets and bees. Individuals have different reactions to insect stings.  A normal reaction may cause pain, swelling, and redness around the sting site.  A localized allergic reaction may cause swelling and redness that extends beyond the sting site.  A large local reaction may continue to develop over the next 12 to 36 hours.  On occasion, the reactions can be severe (anaphylactic reaction). An anaphylactic reaction may cause wheezing; difficulty breathing; chest pain; fainting; raised, itchy, red patches on the skin; a sick feeling to your stomach (nausea); vomiting; cramping; or diarrhea. If you have had an anaphylactic reaction to an insect sting in the past, you are more likely to have one again. HOME CARE INSTRUCTIONS   With bee stings, a small sac of poison is left in the wound. Brushing across this with something such as a credit card, or anything similar, will help remove this and decrease the amount of the reaction. This same procedure will not help a wasp sting as they do not leave behind a stinger and poison sac.  Apply a cold compress for 10 to 20 minutes every hour for 1 to 2 days, depending on severity, to reduce swelling and itching.  To lessen pain, a paste made of water and baking soda may be rubbed on the bite or sting and left on for 5 minutes.  To relieve itching and swelling, you may use take medication or apply medicated creams or lotions as directed.  Only take over-the-counter or prescription medicines for pain, discomfort, or fever as directed by your caregiver.  Wash the sting site daily with soap and water. Apply  antibiotic ointment on the sting site as directed.  If you suffered a severe reaction:  If you did not require hospitalization, an adult will need to stay with you for 24 hours in case the symptoms return.  You may need to wear a medical bracelet or necklace stating the allergy.  You and your family need to learn when and how to use an anaphylaxis kit or epinephrine injection.  If you have had a severe reaction before, always carry your anaphylaxis kit with you. SEEK MEDICAL CARE IF:   None of the above helps within 2 to 3 days.  The area becomes red, warm, tender, and swollen beyond the area of the bite or sting.  You have an oral temperature above 102 F (38.9 C). SEEK IMMEDIATE MEDICAL CARE IF:  You have symptoms of an allergic reaction which are:  Wheezing.  Difficulty breathing.  Chest pain.  Lightheadedness or fainting.  Itchy, raised, red patches on the skin.  Nausea, vomiting, cramping or diarrhea. ANY OF THESE SYMPTOMS MAY REPRESENT A SERIOUS PROBLEM THAT IS AN EMERGENCY. Do not wait to see if the symptoms will go away. Get medical help right away. Call your local emergency services (911 in [place] DO NOT drive yourself to the hospital. MAKE SURE YOU:   Understand these instructions.  Will watch your condition.  Will get help right away if you are not doing well or get worse. Document Released: 04/12/2005 Document Revised: 07/05/2011 Document Reviewed: 09/27/2009 [employer] Patient Information  2015 [employer]. This information is not intended to replace advice given to you by your health care provider. Make sure you discuss any questions you have with your health care provider.

## 2013-12-02 ENCOUNTER — Other Ambulatory Visit: Payer: Self-pay | Admitting: Family Medicine

## 2013-12-04 NOTE — Telephone Encounter (Signed)
Med filled.  

## 2013-12-17 ENCOUNTER — Other Ambulatory Visit: Payer: Self-pay | Admitting: Family Medicine

## 2013-12-17 NOTE — Telephone Encounter (Signed)
Last OV 11-05-13 Med filled 10-30-13 #90 with 0  Low risk

## 2013-12-18 NOTE — Telephone Encounter (Signed)
Med filled and faxed.  

## 2014-01-02 ENCOUNTER — Encounter: Payer: Self-pay | Admitting: Internal Medicine

## 2014-01-11 ENCOUNTER — Other Ambulatory Visit: Payer: Self-pay | Admitting: Family Medicine

## 2014-01-11 NOTE — Telephone Encounter (Signed)
Med filled.  

## 2014-02-06 ENCOUNTER — Other Ambulatory Visit: Payer: Self-pay

## 2014-02-06 MED ORDER — LORAZEPAM 0.5 MG PO TABS: ORAL_TABLET | ORAL | Status: DC

## 2014-02-07 ENCOUNTER — Other Ambulatory Visit: Payer: Self-pay | Admitting: General Practice

## 2014-02-07 NOTE — Telephone Encounter (Signed)
Lorazepam was filled on 10-14.

## 2014-02-08 ENCOUNTER — Encounter: Payer: Self-pay | Admitting: Internal Medicine

## 2014-02-08 ENCOUNTER — Other Ambulatory Visit: Payer: Self-pay | Admitting: General Practice

## 2014-02-08 MED ORDER — LORAZEPAM 0.5 MG PO TABS: ORAL_TABLET | ORAL | Status: DC

## 2014-02-08 NOTE — Telephone Encounter (Signed)
Last OV 11-05-13 Lorazepam filled today #60 with 2 refills.

## 2014-02-18 ENCOUNTER — Other Ambulatory Visit: Payer: Self-pay | Admitting: Family Medicine

## 2014-02-18 NOTE — Telephone Encounter (Signed)
Med filled.  

## 2014-02-25 ENCOUNTER — Encounter (HOSPITAL_COMMUNITY): Payer: Self-pay | Admitting: Emergency Medicine

## 2014-03-13 ENCOUNTER — Ambulatory Visit (AMBULATORY_SURGERY_CENTER): Payer: Self-pay | Admitting: *Deleted

## 2014-03-13 VITALS — Ht 62.0 in | Wt 129.0 lb

## 2014-03-13 DIAGNOSIS — Z86010 Personal history of colonic polyps: Secondary | ICD-10-CM

## 2014-03-13 MED ORDER — MOVIPREP 100 G PO SOLR: 1.0000 | Freq: Once | ORAL | Status: DC

## 2014-03-13 NOTE — Progress Notes (Signed)
No egg or soy allergy. ewm No issues with past sedation. ewm No home 02 use. ewm No diet pills, no blood thinners. ewm

## 2014-03-19 LAB — HM MAMMOGRAPHY

## 2014-03-26 ENCOUNTER — Encounter: Payer: Self-pay | Admitting: General Practice

## 2014-03-27 ENCOUNTER — Ambulatory Visit (AMBULATORY_SURGERY_CENTER): Payer: Medicare Other | Admitting: Internal Medicine

## 2014-03-27 ENCOUNTER — Encounter: Payer: Self-pay | Admitting: Internal Medicine

## 2014-03-27 VITALS — BP 124/97 | HR 65 | Temp 97.9°F | Resp 16 | Ht 62.0 in | Wt 129.0 lb

## 2014-03-27 DIAGNOSIS — D124 Benign neoplasm of descending colon: Secondary | ICD-10-CM

## 2014-03-27 DIAGNOSIS — Z800 Family history of malignant neoplasm of digestive organs: Secondary | ICD-10-CM

## 2014-03-27 DIAGNOSIS — K635 Polyp of colon: Secondary | ICD-10-CM

## 2014-03-27 DIAGNOSIS — Z86010 Personal history of colonic polyps: Secondary | ICD-10-CM

## 2014-03-27 MED ORDER — SODIUM CHLORIDE 0.9 % IV SOLN: 500.0000 mL | INTRAVENOUS | Status: DC

## 2014-03-27 NOTE — Progress Notes (Signed)
Called to room to assist during endoscopic procedure.  Patient ID and intended procedure confirmed with present staff. Received instructions for my participation in the procedure from the performing physician.  

## 2014-03-27 NOTE — Patient Instructions (Signed)
YOU HAD AN ENDOSCOPIC PROCEDURE TODAY AT THE [facility]: Refer to the procedure report that was given to you for any specific questions about what was found during the examination.  If the procedure report does not answer your questions, please call your gastroenterologist to clarify.  If you requested that your care partner not be given the details of your procedure findings, then the procedure report has been included in a sealed envelope for you to review at your convenience later.  YOU SHOULD EXPECT: Some feelings of bloating in the abdomen. Passage of more gas than usual.  Walking can help get rid of the air that was put into your GI tract during the procedure and reduce the bloating. If you had a lower endoscopy (such as a colonoscopy or flexible sigmoidoscopy) you may notice spotting of blood in your stool or on the toilet paper. If you underwent a bowel prep for your procedure, then you may not have a normal bowel movement for a few days.  DIET: Your first meal following the procedure should be a light meal and then it is ok to progress to your normal diet.  A half-sandwich or bowl of soup is an example of a good first meal.  Heavy or fried foods are harder to digest and may make you feel nauseous or bloated.  Likewise meals heavy in dairy and vegetables can cause extra gas to form and this can also increase the bloating.  Drink plenty of fluids but you should avoid alcoholic beverages for 24 hours.  ACTIVITY: Your care partner should take you home directly after the procedure.  You should plan to take it easy, moving slowly for the rest of the day.  You can resume normal activity the day after the procedure however you should NOT DRIVE or use heavy machinery for 24 hours (because of the sedation medicines used during the test).    SYMPTOMS TO REPORT IMMEDIATELY: A gastroenterologist can be reached at any hour.  During normal business hours, 8:30 AM to 5:00 PM Monday through Friday,  call [phone].  After hours and on weekends, please call the GI answering service at [phone] who will take a message and have the physician on call contact you.   Following lower endoscopy (colonoscopy or flexible sigmoidoscopy):  Excessive amounts of blood in the stool  Significant tenderness or worsening of abdominal pains  Swelling of the abdomen that is new, acute  Fever of 100F or higher    FOLLOW UP: If any biopsies were taken you will be contacted by phone or by letter within the next 1-3 weeks.  Call your gastroenterologist if you have not heard about the biopsies in 3 weeks.  Our staff will call the home number listed on your records the next business day following your procedure to check on you and address any questions or concerns that you may have at that time regarding the information given to you following your procedure. This is a courtesy call and so if there is no answer at the home number and we have not heard from you through the emergency physician on call, we will assume that you have returned to your regular daily activities without incident.  SIGNATURES/CONFIDENTIALITY: You and/or your care partner have signed paperwork which will be entered into your electronic medical record.  These signatures attest to the fact that that the information above on your After Visit Summary has been reviewed and is understood.  Full responsibility of the confidentiality   of this discharge information lies with you and/or your care-partner.     

## 2014-03-27 NOTE — Op Note (Signed)
[facility]  [address] [place], [id number]   COLONOSCOPY PROCEDURE REPORT  PATIENT: Amanda Jackson, Amanda Jackson  MR#: [id number] BIRTHDATE: 01-31-1941 , 73  yrs. old GENDER: female ENDOSCOPIST: Lafayette Dragon, MD REFERRED SK:[id number] Wadie Lessen, M.D. PROCEDURE DATE:  03/27/2014 PROCEDURE:   Colonoscopy with cold biopsy polypectomy First Screening Colonoscopy - Avg.  risk and is 50 yrs.  old or older - No.  Prior Negative Screening - Now for repeat screening. N/A  History of Adenoma - Now for follow-up colonoscopy & has been > or = to 3 yrs.  N/A  Polyps Removed Today? Yes. ASA CLASS:   Class II INDICATIONS:status post right hemicolectomy in 2004.  Last colonoscopy 2010 showed tubular adenoma.  Positive family history of colon cancer in mother and sister. MEDICATIONS: Monitored anesthesia care and Propofol 300 mg IV  DESCRIPTION OF PROCEDURE:   After the risks benefits and alternatives of the procedure were thoroughly explained, informed consent was obtained.  The digital rectal exam revealed no abnormalities of the rectum.   The LB [id number]-H190 [id number]  endoscope was introduced through the anus and advanced to the surgical anastomosis. No adverse events experienced.   The quality of the prep was good, using MoviPrep  The instrument was then slowly withdrawn as the colon was fully examined.      COLON FINDINGS: There was evidence of a prior ileocolonic surgical anastomosis.   A flat polyp measuring 3 mm in size was found in the sigmoid colon.  A polypectomy was performed with cold forceps.  The resection was complete, the polyp tissue was completely retrieved and sent to histology.   There was mild diverticulosis noted with associated tortuosity.  Retroflexed views revealed no abnormalities. The time to cecum=7 minutes 15 seconds.  Withdrawal time=6 minutes 00 seconds.  The scope was withdrawn and the procedure completed. COMPLICATIONS: There were no immediate  complications.  ENDOSCOPIC IMPRESSION: 1.   There was evidence of a prior ileocolonic surgical anastomosis 2.   Flat polyp was found in the sigmoid colon; polypectomy was performed with cold forceps 3.   There was mild diverticulosis noted  RECOMMENDATIONS: 1.  Await biopsy results 2.  High-fiber diet Recall colonoscopy pending path report  eSigned:  Lafayette Dragon, MD 03/27/2014 4:09 PM   cc:   PATIENT NAME:  Amanda Jackson MR#: [id number]

## 2014-03-27 NOTE — Progress Notes (Signed)
Stable to RR awake

## 2014-03-28 ENCOUNTER — Telehealth: Payer: Self-pay | Admitting: *Deleted

## 2014-03-28 NOTE — Telephone Encounter (Signed)
Erroneous chart. Did not call this pt.  She was called by Margie Ege RN.

## 2014-03-28 NOTE — Telephone Encounter (Signed)
  Follow up Call-  Call back number 03/27/2014  Post procedure Call Back phone  # [phone]  Permission to leave phone message Yes     Patient questions:  Do you have a fever, pain , or abdominal swelling? No. Pain Score  0 *  Have you tolerated food without any problems? Yes.    Have you been able to return to your normal activities? Yes.    Do you have any questions about your discharge instructions: Diet   No. Medications  No. Follow up visit  No.  Do you have questions or concerns about your Care? No.  Actions: * If pain score is 4 or above: No action needed, pain <4.

## 2014-04-04 ENCOUNTER — Encounter: Payer: Self-pay | Admitting: Internal Medicine

## 2014-04-08 ENCOUNTER — Encounter: Payer: Self-pay | Admitting: Family Medicine

## 2014-05-06 ENCOUNTER — Encounter: Payer: Self-pay | Admitting: Family Medicine

## 2014-05-06 ENCOUNTER — Ambulatory Visit (INDEPENDENT_AMBULATORY_CARE_PROVIDER_SITE_OTHER): Payer: Medicare Other | Admitting: Family Medicine

## 2014-05-06 ENCOUNTER — Ambulatory Visit (INDEPENDENT_AMBULATORY_CARE_PROVIDER_SITE_OTHER): Payer: Medicare Other | Admitting: General Practice

## 2014-05-06 VITALS — BP 118/74 | HR 75 | Temp 97.9°F | Resp 16 | Ht 62.5 in | Wt 130.5 lb

## 2014-05-06 DIAGNOSIS — Z23 Encounter for immunization: Secondary | ICD-10-CM

## 2014-05-06 DIAGNOSIS — R7989 Other specified abnormal findings of blood chemistry: Secondary | ICD-10-CM

## 2014-05-06 DIAGNOSIS — I10 Essential (primary) hypertension: Secondary | ICD-10-CM

## 2014-05-06 LAB — LIPID PANEL
Cholesterol: 166 mg/dL (ref 0–200)
HDL: 54.1 mg/dL (ref >39.00)
LDL Cholesterol: 89 mg/dL (ref 0–99)
NonHDL: 111.9
Total CHOL/HDL Ratio: 3
Triglycerides: 114 mg/dL (ref 0.0–149.0)
VLDL: 22.8 mg/dL (ref 0.0–40.0)

## 2014-05-06 LAB — HEPATIC FUNCTION PANEL
ALT: 18 U/L (ref 0–35)
AST: 19 U/L (ref 0–37)
Albumin: 4.3 g/dL (ref 3.5–5.2)
Alkaline Phosphatase: 69 U/L (ref 39–117)
Bilirubin, Direct: 0 mg/dL (ref 0.0–0.3)
Total Bilirubin: 0.3 mg/dL (ref 0.2–1.2)
Total Protein: 7.2 g/dL (ref 6.0–8.3)

## 2014-05-06 LAB — BASIC METABOLIC PANEL
BUN: 25 mg/dL — ABNORMAL HIGH (ref 6–23)
CO2: 24 mEq/L (ref 19–32)
Calcium: 9.7 mg/dL (ref 8.4–10.5)
Chloride: 105 mEq/L (ref 96–112)
Creatinine, Ser: 0.6 mg/dL (ref 0.4–1.2)
GFR: 116.93 mL/min (ref >60.00)
Glucose, Bld: 107 mg/dL — ABNORMAL HIGH (ref 70–99)
Potassium: 3.9 mEq/L (ref 3.5–5.1)
Sodium: 137 mEq/L (ref 135–145)

## 2014-05-06 LAB — TSH: TSH: 1.11 u[IU]/mL (ref 0.35–4.50)

## 2014-05-06 NOTE — Progress Notes (Signed)
   Subjective:    Patient ID: Amanda Jackson, female    DOB: Oct 12, 1940, 74 y.o.   MRN: [id number]  HPI HTN- chronic problem, on Losartan HCTZ, Amlodipine, Coreg.  Denies CP, SOB above baseline (due to asthma), HAs, visual changes, edema, N/V/D, abd pain.  Hx of abnormal TSH- due for labs.  Denies excessive fatigue, changes to skin/hair/nails.   Review of Systems For ROS see HPI     Objective:   Physical Exam  Constitutional: She is oriented to person, place, and time. She appears well-developed and well-nourished. No distress.  HENT:  Head: Normocephalic and atraumatic.  Eyes: Conjunctivae and EOM are normal. Pupils are equal, round, and reactive to light.  Neck: Normal range of motion. Neck supple. No thyromegaly present.  Cardiovascular: Normal rate, regular rhythm, normal heart sounds and intact distal pulses.   No murmur heard. Pulmonary/Chest: Effort normal and breath sounds normal. No respiratory distress.  Abdominal: Soft. She exhibits no distension. There is no tenderness.  Musculoskeletal: She exhibits no edema.  Lymphadenopathy:    She has no cervical adenopathy.  Neurological: She is alert and oriented to person, place, and time.  Skin: Skin is warm and dry.  Psychiatric: She has a normal mood and affect. Her behavior is normal.  Vitals reviewed.         Assessment & Plan:

## 2014-05-06 NOTE — Progress Notes (Signed)
Pre visit review using our clinic review tool, if applicable. No additional management support is needed unless otherwise documented below in the visit note. 

## 2014-05-06 NOTE — Assessment & Plan Note (Signed)
Chronic problem.  Recent labs have been normal.  Check TSH today to determine if additional w/u or tx is needed.

## 2014-05-06 NOTE — Patient Instructions (Signed)
Schedule your complete physical in 6 months We'll notify you of your lab results and make any changes if needed We'll look into who's available at [facility] and would be a good fit Keep up the good work!  You look great! Call with any questions or concerns Happy New Year!!

## 2014-05-06 NOTE — Assessment & Plan Note (Signed)
Chronic problem.  Well controlled.  Asymptomatic.  Check labs.  No anticipated med changes. 

## 2014-05-07 ENCOUNTER — Encounter: Payer: Self-pay | Admitting: General Practice

## 2014-05-07 ENCOUNTER — Other Ambulatory Visit: Payer: Medicare Other

## 2014-05-07 LAB — CBC WITH DIFFERENTIAL/PLATELET
BASOS ABS: 0 10*3/uL (ref 0.0–0.1)
Basophils Relative: 0.8 % (ref 0.0–3.0)
EOS ABS: 0.6 10*3/uL (ref 0.0–0.7)
Eosinophils Relative: 11.5 % — ABNORMAL HIGH (ref 0.0–5.0)
HCT: 37.6 % (ref 36.0–46.0)
Hemoglobin: 12.2 g/dL (ref 12.0–15.0)
LYMPHS PCT: 26.9 % (ref 12.0–46.0)
Lymphs Abs: 1.5 10*3/uL (ref 0.7–4.0)
MCHC: 32.4 g/dL (ref 30.0–36.0)
MCV: 93.7 fl (ref 78.0–100.0)
Monocytes Absolute: 0.6 10*3/uL (ref 0.1–1.0)
Monocytes Relative: 10.7 % (ref 3.0–12.0)
NEUTROS PCT: 50.1 % (ref 43.0–77.0)
Neutro Abs: 2.8 10*3/uL (ref 1.4–7.7)
PLATELETS: 269 10*3/uL (ref 150.0–400.0)
RBC: 4.01 Mil/uL (ref 3.87–5.11)
RDW: 13 % (ref 11.5–15.5)
WBC: 5.6 10*3/uL (ref 4.0–10.5)

## 2014-05-07 NOTE — Addendum Note (Signed)
Addended by: Peggyann Shoals on: 05/07/2014 09:46 AM   Modules accepted: Orders

## 2014-05-16 ENCOUNTER — Other Ambulatory Visit: Payer: Self-pay | Admitting: General Practice

## 2014-05-16 ENCOUNTER — Telehealth: Payer: Self-pay | Admitting: Family Medicine

## 2014-05-16 MED ORDER — LORAZEPAM 0.5 MG PO TABS: ORAL_TABLET | ORAL | Status: DC

## 2014-05-16 MED ORDER — NITROGLYCERIN 0.4 MG SL SUBL: SUBLINGUAL_TABLET | SUBLINGUAL | Status: DC

## 2014-05-16 NOTE — Telephone Encounter (Signed)
Med filled and faxed.  

## 2014-05-16 NOTE — Telephone Encounter (Signed)
[place] for #60, 2 refills

## 2014-05-16 NOTE — Telephone Encounter (Signed)
Last OV 02-08-14 Lorazepam  Last filled 02-08-14 #60 with 2

## 2014-05-16 NOTE — Telephone Encounter (Signed)
Caller name: Camrie Relation to pt: self Call back number: [facility] [address]  Reason for call:   Patient would like a refill of lorazepam. She is requesting this today because she has requested this earlier in the week at the pharmacy.

## 2014-06-22 ENCOUNTER — Other Ambulatory Visit: Payer: Self-pay | Admitting: Cardiology

## 2014-06-27 ENCOUNTER — Other Ambulatory Visit: Payer: Self-pay | Admitting: Cardiology

## 2014-06-28 ENCOUNTER — Other Ambulatory Visit: Payer: Self-pay | Admitting: General Practice

## 2014-06-28 MED ORDER — FA-PYRIDOXINE-CYANOCOBALAMIN 2.5-25-2 MG PO TABS: 1.0000 | ORAL_TABLET | Freq: Every day | ORAL | Status: DC

## 2014-07-01 ENCOUNTER — Other Ambulatory Visit: Payer: Self-pay | Admitting: Cardiology

## 2014-07-05 ENCOUNTER — Other Ambulatory Visit: Payer: Self-pay | Admitting: Cardiology

## 2014-07-15 ENCOUNTER — Other Ambulatory Visit: Payer: Self-pay | Admitting: General Practice

## 2014-07-15 MED ORDER — NITROGLYCERIN 0.4 MG SL SUBL: SUBLINGUAL_TABLET | SUBLINGUAL | Status: DC

## 2014-07-15 MED ORDER — POTASSIUM CHLORIDE CRYS ER 20 MEQ PO TBCR: EXTENDED_RELEASE_TABLET | ORAL | Status: DC

## 2014-07-19 ENCOUNTER — Ambulatory Visit (INDEPENDENT_AMBULATORY_CARE_PROVIDER_SITE_OTHER): Payer: Medicare Other | Admitting: Cardiology

## 2014-07-19 ENCOUNTER — Encounter: Payer: Self-pay | Admitting: Cardiology

## 2014-07-19 VITALS — BP 112/64 | HR 72 | Ht 62.5 in | Wt 129.4 lb

## 2014-07-19 DIAGNOSIS — R0789 Other chest pain: Secondary | ICD-10-CM

## 2014-07-19 DIAGNOSIS — Z01810 Encounter for preprocedural cardiovascular examination: Secondary | ICD-10-CM

## 2014-07-19 DIAGNOSIS — I10 Essential (primary) hypertension: Secondary | ICD-10-CM | POA: Diagnosis not present

## 2014-07-19 NOTE — Patient Instructions (Signed)
Your physician recommends that you continue on your current medications as directed. Please refer to the Current Medication list given to you today.   Your physician wants you to follow-up in: ONE YEAR WITH DR NELSON You will receive a reminder letter in the mail two months in advance. If you don't receive a letter, please call our office to schedule the follow-up appointment.  

## 2014-07-19 NOTE — Progress Notes (Signed)
Patient ID: Amanda Jackson, female   DOB: [birth year], 74 y.o.   MRN: [id number]    HPI: Amanda Jackson is a delightful 74 year old woman history of hypertension and atypical chest pain with a normal Myoview in 2008.  She also has seasonal allergies.  She presents today for routine followup.  She is doing very well.  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 She denies any shortness of breath, she experiences only occasional sharp chest pains that are relieved by walking around and taking sublingual nitroglycerin. She states that she experiences those mostly with emotional stress and when she relieves the tension the pain resolves. These episodes are rare and they are same in character as when she had her stress test that was negative for ischemia. 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 She is very compliant with her meds.   07/19/2014 - patient is coming after one year, she feels great, she will occasionally feel chest pain every months or 2 that is resolved with sublingual nitroglycerin. It doesn't happen on exertion but on emotional stress.  The patient states she doesn't any shortness of breath, no palpitations or syncope. She still hasn't had your knee surgery but will need its own. She is also scheduled with ENT for potential polyp removal. Is very compliant to her meds and her blood pressures usually controlled.   Lab Results  Component Value Date   CHOL 166 05/06/2014   HDL 54.10 05/06/2014   LDLCALC 89 05/06/2014   TRIG 114.0 05/06/2014   CHOLHDL 3 05/06/2014     ROS: All systems negative except as listed in HPI, PMH and Problem List.  Past Medical History  Diagnosis Date  . Hypertension   . Atypical chest pain     Normal Myoview 2008  . Seasonal allergies   . Hx of colonoscopy  03/18/09  . Anxiety   . MVP (mitral valve prolapse)   . Asthma   . Tubulovillous adenoma   . Arthritis   . Colon polyp   . Allergy   . GERD (gastroesophageal reflux disease)   . Hyperlipidemia     Current Outpatient Prescriptions  Medication Sig Dispense Refill  . acetaminophen (TYLENOL ARTHRITIS PAIN) 650 MG CR tablet Take 650 mg by mouth every 8 (eight) hours as needed for pain.    Marland Kitchen albuterol (PROVENTIL HFA) 108 (90 BASE) MCG/ACT inhaler Inhale 2 puffs into the lungs every 4 (four) hours as needed (for cough or wheezing).     Marland Kitchen amLODipine (NORVASC) 10 MG tablet TAKE 1 TABLET (10 MG TOTAL) BY MOUTH 2 (TWO) TIMES DAILY. 180 tablet 0  . aspirin 81 MG EC tablet Take 81 mg by mouth daily.      . Beclomethasone Dipropionate 80 MCG/ACT AERS Place 1 Squirt into the nose 2 (two) times daily. Spray 1 spray in each nostril twice daily for nasal polyps    . Calcium Carb-Cholecalciferol (CALCIUM 600+D3 PO) Take 600 mg by mouth 2 (two) times daily.     . carvedilol (COREG) 12.5 MG tablet TAKE ONE TABLET BY MOUTH TWICE DAILY WITH A MEAL 60 tablet 6  . conjugated estrogens (PREMARIN) vaginal cream Place 0.5 g vaginally 2 (two) times a week. As needed for spotting    . folic  acid-pyridoxine-cyancobalamin (FOLBIC) 2.5-25-2 MG TABS Take 1 tablet by mouth daily. 30 tablet 5  . ibuprofen (ADVIL,MOTRIN) 800 MG tablet TAKE 1 TABLET (800 MG TOTAL) BY MOUTH 3 (THREE) TIMES DAILY AS NEEDED. (Patient taking differently: TAKE 1 TABLET (800 MG TOTAL) BY MOUTH 3 (THREE) TIMES DAILY AS NEEDED FOR PAIN) 90 tablet 3  . LORazepam (ATIVAN) 0.5 MG tablet TAKE 1 TABLET TWICE A DAY 60 tablet 2  . losartan-hydrochlorothiazide (HYZAAR) 100-25 MG per tablet TAKE 1 TABLET BY MOUTH DAILY. 90 tablet 0  . mometasone (NASONEX) 50 MCG/ACT nasal spray Place 2 sprays into the nose daily.     . montelukast (SINGULAIR) 10 MG tablet Take 10 mg by mouth daily.      . Multiple Vitamins-Minerals (CENTRUM) tablet Take 1 tablet by mouth  daily.      . nitroGLYCERIN (NITROSTAT) 0.4 MG SL tablet AS DIRECTED (Patient taking differently: Place 0.4 mg under the tongue. For chest pain (patient unsure of MAX doses allowed)) 25 tablet 0  . omeprazole (PRILOSEC) 20 MG capsule TAKE 1 CAPSULE TWICE A DAY 60 capsule 4  . potassium chloride SA (KLOR-CON M20) 20 MEQ tablet TAKE 2 TABLETS BY MOUTH 3 TIMES A DAY 180 tablet 5   No current facility-administered medications for this visit.    PHYSICAL EXAM: There were no vitals filed for this visit. General:  Well appearing. No resp difficulty HEENT: normal Neck: supple. JVP flat. Carotids 2+ bilaterally; no bruits. No lymphadenopathy or thryomegaly appreciated. Cor: PMI normal. Regular rate & rhythm. No rubs, gallops or murmurs. Lungs: clear Abdomen: soft, nontender, nondistended. No hepatosplenomegaly. No bruits or masses. Good bowel sounds. Extremities: no cyanosis, clubbing, rash, edema. Left knee swelling  Neuro: alert & orientedx3, cranial nerves grossly intact. Moves all 4 extremities w/o difficulty. Affect pleasant.  ECG: NSR 70 No ST-T wave abnormalities.     ASSESSMENT & PLAN:  A very pleasant 74 year old female  1.   Hypertension - well controlled on current regimen she recently had all the labs drawn and they were all normal including kidney function electrolytes and liver function tests.  2. atypical chest pain - negative ischemia workup in 2008, unchanged symptoms patient very functional. No further workup necessary at this point. Her EKG is completely normal and unchanged from last year.  3. Lipids - lipids at goal in January 2016.  The patient is considered a low risk for moderate risk surgery such as knee replacement. At this point there is no contraindication from cardiovascular standpoint for this patient to undergo her surgery. We'll recommend for her to continue her medications during the perioperative period.  Follow up in 1 year.  [name] 07/19/2014

## 2014-07-24 ENCOUNTER — Other Ambulatory Visit: Payer: Self-pay | Admitting: General Practice

## 2014-07-24 MED ORDER — POTASSIUM CHLORIDE CRYS ER 20 MEQ PO TBCR: EXTENDED_RELEASE_TABLET | ORAL | Status: DC

## 2014-07-26 ENCOUNTER — Telehealth: Payer: Self-pay | Admitting: Family Medicine

## 2014-07-26 MED ORDER — IBUPROFEN 800 MG PO TABS: ORAL_TABLET | ORAL | Status: DC

## 2014-07-26 NOTE — Telephone Encounter (Signed)
Med filled.  

## 2014-07-26 NOTE — Telephone Encounter (Signed)
Caller name: K[place], [place] Relation to pt: self  Call back number: [phone] [facility] [place] [id number] [phone] (Phone)  Reason for call:  Requesting a refill ibuprofen (ADVIL,MOTRIN) 800 MG tablet

## 2014-07-30 ENCOUNTER — Other Ambulatory Visit (HOSPITAL_COMMUNITY): Payer: Self-pay | Admitting: Otolaryngology

## 2014-07-30 DIAGNOSIS — J324 Chronic pansinusitis: Secondary | ICD-10-CM

## 2014-08-05 ENCOUNTER — Ambulatory Visit (HOSPITAL_COMMUNITY): Payer: Medicare Other

## 2014-08-06 ENCOUNTER — Ambulatory Visit (HOSPITAL_COMMUNITY)
Admission: RE | Admit: 2014-08-06 | Discharge: 2014-08-06 | Disposition: A | Discharge status: Home / Self Care | Payer: Medicare Other | Source: Ambulatory Visit | Attending: Otolaryngology | Admitting: Otolaryngology

## 2014-08-06 DIAGNOSIS — J324 Chronic pansinusitis: Secondary | ICD-10-CM | POA: Diagnosis not present

## 2014-08-09 ENCOUNTER — Telehealth: Payer: Self-pay | Admitting: General Practice

## 2014-08-09 MED ORDER — LORAZEPAM 0.5 MG PO TABS: ORAL_TABLET | ORAL | Status: DC

## 2014-08-09 NOTE — Telephone Encounter (Signed)
[place] for #60, 1 refill 

## 2014-08-09 NOTE — Telephone Encounter (Signed)
Med filled and faxed.  

## 2014-08-09 NOTE — Telephone Encounter (Signed)
Last OV 05/06/14 Lorazepam last filled 05/16/14 #60 with 0

## 2014-08-14 ENCOUNTER — Telehealth: Payer: Self-pay | Admitting: Cardiology

## 2014-08-14 NOTE — Telephone Encounter (Signed)
New problem   Pt need to speak to nurse concerning her having ENT sx. Pt stated Dr Ernesto Rutherford office called here and she was suppose to sched an appt. Per my protocol the pt's aren't suppose to sched sx clearance appt. Please advise pt.

## 2014-08-14 NOTE — Telephone Encounter (Signed)
Spoke with the pt as she was calling as instructed by Dr Ernesto Rutherford to contact our office to set up an appt for surgical clearance to receive sinus surgery.  Informed the pt that per she will not require another follow-up appt for Dr Meda Coffee just saw her on 07/19/14 and noted clearance for the pt that day to receive knee surgery.  Informed the pt that all Dr Berle Mull office needs to do is fax the document over stating exactly if he needs clearance for the pt to come off of any cardiac drugs or needs cardiac clearance.  Informed the pt that once we receive the this fax from his office, Dr will then further advise on the clearance and we will fax this back to Dr Mackey Birchwood office.  Gave the pt our fax number to have Dr Ernesto Rutherford send note too.  Pt verbalized understanding and agrees with this plan.

## 2014-08-22 ENCOUNTER — Telehealth: Payer: Self-pay | Admitting: Cardiology

## 2014-08-22 NOTE — Telephone Encounter (Signed)
Contacted Dr Berle Mull office and spoke with Gwynneth Macleod to inform him that we will need them to fax over information in regards to what type of surgery the pt will be having, will the pt be under general anesthesia, and does Dr Ernesto Rutherford want cardiac clearance, medication clearance, or both.  Informed Wes at Dr Berle Mull office what our fax number is and attention to Dr Meda Coffee.  Wes verbalized understanding and agrees with this plan.

## 2014-08-22 NOTE — Telephone Encounter (Signed)
Will defer this message to Dr Meda Coffee for further review and recommendation of meds

## 2014-08-22 NOTE — Telephone Encounter (Signed)
Request for surgical clearance:  1. What type of surgery is being performed? Sinus surgery  2. When is this surgery scheduled? Pending   3. Are there any medications that need to be held prior to surgery and how long?Need Dr Meda Coffee to review her meds..for general anesthesia   4. Name of physician performing surgery? Dr Ernesto Rutherford   5. What is your office phone and fax number?   [phone] fax.

## 2014-08-22 NOTE — Telephone Encounter (Signed)
Clearance for the pt to have sinus surgery is complete and filled out per Dr Meda Coffee and faxed to Dr Berle Mull office as requested.

## 2014-09-11 ENCOUNTER — Other Ambulatory Visit (HOSPITAL_COMMUNITY): Payer: Self-pay | Admitting: Orthopaedic Surgery

## 2014-09-11 ENCOUNTER — Ambulatory Visit (HOSPITAL_COMMUNITY)
Admission: RE | Admit: 2014-09-11 | Discharge: 2014-09-11 | Disposition: A | Discharge status: Home / Self Care | Payer: Medicare Other | Source: Ambulatory Visit | Attending: Orthopaedic Surgery | Admitting: Orthopaedic Surgery

## 2014-09-11 DIAGNOSIS — M7989 Other specified soft tissue disorders: Secondary | ICD-10-CM | POA: Diagnosis not present

## 2014-09-11 DIAGNOSIS — M79662 Pain in left lower leg: Secondary | ICD-10-CM

## 2014-09-11 DIAGNOSIS — M79605 Pain in left leg: Secondary | ICD-10-CM

## 2014-09-11 NOTE — Progress Notes (Signed)
Left lower extremity venous duplex completed.  Left:  No evidence of DVT, superficial thrombosis, or Baker's cyst.  There is a non vascularized, irregular structure with mixed internal echoes in the proximal calf of unknown etiology, possible muscle tear.  Right:  Negative for DVT in the common femoral vein.

## 2014-10-02 ENCOUNTER — Other Ambulatory Visit: Payer: Self-pay | Admitting: Orthopaedic Surgery

## 2014-10-03 ENCOUNTER — Other Ambulatory Visit: Payer: Self-pay | Admitting: Cardiology

## 2014-10-09 ENCOUNTER — Telehealth: Payer: Self-pay | Admitting: General Practice

## 2014-10-09 MED ORDER — LORAZEPAM 0.5 MG PO TABS: ORAL_TABLET | ORAL | Status: DC

## 2014-10-09 NOTE — Telephone Encounter (Signed)
Ok 60 and 1

## 2014-10-09 NOTE — Telephone Encounter (Signed)
Med filled and faxed.  

## 2014-10-09 NOTE — Telephone Encounter (Signed)
Last OV 05-06-14 Lorazepam 0.'5mg'$  last filled 08-09-14 #60 with 1  Low Risk, Pt due for UDS at 11-04-14 appt.

## 2014-10-11 ENCOUNTER — Other Ambulatory Visit: Payer: Self-pay | Admitting: Otolaryngology

## 2014-10-11 ENCOUNTER — Other Ambulatory Visit: Payer: Self-pay | Admitting: Cardiology

## 2014-10-11 ENCOUNTER — Other Ambulatory Visit: Payer: Self-pay | Admitting: Family Medicine

## 2014-10-11 NOTE — Telephone Encounter (Signed)
Med filled.  

## 2014-11-01 ENCOUNTER — Telehealth: Payer: Self-pay | Admitting: Internal Medicine

## 2014-11-01 NOTE — Telephone Encounter (Signed)
Patient calling to report 2 episodes of LLQ pain. The last one occurred on Tuesday and lasted less than 10 minutes. She states she ate mustard and it stopped. No pain at this time. She is asking for OV to be checked out. Scheduled OV on 11/19/14.

## 2014-11-04 ENCOUNTER — Ambulatory Visit (INDEPENDENT_AMBULATORY_CARE_PROVIDER_SITE_OTHER): Payer: Medicare Other | Admitting: Family Medicine

## 2014-11-04 ENCOUNTER — Encounter: Payer: Self-pay | Admitting: Family Medicine

## 2014-11-04 ENCOUNTER — Encounter: Payer: Self-pay | Admitting: General Practice

## 2014-11-04 ENCOUNTER — Ambulatory Visit: Payer: Medicare Other | Admitting: Family Medicine

## 2014-11-04 VITALS — BP 114/74 | HR 70 | Temp 97.9°F | Resp 16 | Ht 63.0 in | Wt 122.2 lb

## 2014-11-04 DIAGNOSIS — J4540 Moderate persistent asthma, uncomplicated: Secondary | ICD-10-CM | POA: Diagnosis not present

## 2014-11-04 DIAGNOSIS — R7989 Other specified abnormal findings of blood chemistry: Secondary | ICD-10-CM | POA: Diagnosis not present

## 2014-11-04 DIAGNOSIS — R1032 Left lower quadrant pain: Secondary | ICD-10-CM | POA: Diagnosis not present

## 2014-11-04 DIAGNOSIS — Z0000 Encounter for general adult medical examination without abnormal findings: Secondary | ICD-10-CM | POA: Diagnosis not present

## 2014-11-04 DIAGNOSIS — Z780 Asymptomatic menopausal state: Secondary | ICD-10-CM

## 2014-11-04 DIAGNOSIS — I10 Essential (primary) hypertension: Secondary | ICD-10-CM

## 2014-11-04 LAB — HEPATIC FUNCTION PANEL
ALT: 17 U/L (ref 0–35)
AST: 19 U/L (ref 0–37)
Albumin: 4.1 g/dL (ref 3.5–5.2)
Alkaline Phosphatase: 79 U/L (ref 39–117)
BILIRUBIN TOTAL: 0.6 mg/dL (ref 0.2–1.2)
Bilirubin, Direct: 0.1 mg/dL (ref 0.0–0.3)
TOTAL PROTEIN: 7.3 g/dL (ref 6.0–8.3)

## 2014-11-04 LAB — CBC WITH DIFFERENTIAL/PLATELET
BASOS ABS: 0 10*3/uL (ref 0.0–0.1)
BASOS PCT: 0.5 % (ref 0.0–3.0)
Eosinophils Absolute: 0.1 10*3/uL (ref 0.0–0.7)
Eosinophils Relative: 2.2 % (ref 0.0–5.0)
HCT: 36.4 % (ref 36.0–46.0)
Hemoglobin: 12 g/dL (ref 12.0–15.0)
LYMPHS PCT: 25.9 % (ref 12.0–46.0)
Lymphs Abs: 1.5 10*3/uL (ref 0.7–4.0)
MCHC: 32.9 g/dL (ref 30.0–36.0)
MCV: 92.3 fl (ref 78.0–100.0)
Monocytes Absolute: 0.5 10*3/uL (ref 0.1–1.0)
Monocytes Relative: 9.2 % (ref 3.0–12.0)
Neutro Abs: 3.5 10*3/uL (ref 1.4–7.7)
Neutrophils Relative %: 62.2 % (ref 43.0–77.0)
Platelets: 293 10*3/uL (ref 150.0–400.0)
RBC: 3.95 Mil/uL (ref 3.87–5.11)
RDW: 13.6 % (ref 11.5–15.5)
WBC: 5.7 10*3/uL (ref 4.0–10.5)

## 2014-11-04 LAB — VITAMIN D 25 HYDROXY (VIT D DEFICIENCY, FRACTURES): VITD: 45.85 ng/mL (ref 30.00–100.00)

## 2014-11-04 LAB — BASIC METABOLIC PANEL
BUN: 13 mg/dL (ref 6–23)
CALCIUM: 9.8 mg/dL (ref 8.4–10.5)
CO2: 22 meq/L (ref 19–32)
CREATININE: 0.62 mg/dL (ref 0.40–1.20)
Chloride: 108 mEq/L (ref 96–112)
GFR: 121.13 mL/min (ref >60.00)
Glucose, Bld: 102 mg/dL — ABNORMAL HIGH (ref 70–99)
Potassium: 3.8 mEq/L (ref 3.5–5.1)
Sodium: 138 mEq/L (ref 135–145)

## 2014-11-04 LAB — LIPID PANEL
CHOL/HDL RATIO: 2
Cholesterol: 145 mg/dL (ref 0–200)
HDL: 66.5 mg/dL (ref >39.00)
LDL Cholesterol: 67 mg/dL (ref 0–99)
NONHDL: 78.5
Triglycerides: 56 mg/dL (ref 0.0–149.0)
VLDL: 11.2 mg/dL (ref 0.0–40.0)

## 2014-11-04 LAB — TSH: TSH: 0.58 u[IU]/mL (ref 0.35–4.50)

## 2014-11-04 MED ORDER — FA-PYRIDOXINE-CYANOCOBALAMIN 2.5-25-2 MG PO TABS: 1.0000 | ORAL_TABLET | Freq: Every day | ORAL | Status: DC

## 2014-11-04 MED ORDER — OMEPRAZOLE 20 MG PO CPDR: 20.0000 mg | DELAYED_RELEASE_CAPSULE | Freq: Two times a day (BID) | ORAL | Status: DC

## 2014-11-04 NOTE — Progress Notes (Signed)
Pre visit review using our clinic review tool, if applicable. No additional management support is needed unless otherwise documented below in the visit note. 

## 2014-11-04 NOTE — Progress Notes (Signed)
   Subjective:    Patient ID: Amanda Jackson, female    DOB: 07-20-1940, 74 y.o.   MRN: [id number]  HPI Here today for CPE.  Risk Factors: HTN- chronic problem, on Losartan- HCTZ, Coreg, Amlodipine.  Denies CP, SOB, HAs, visual changes, edema. Abnormal TSH- hx of this, last TSH WNL.  Denies fatigue, changes to skin, hair nails. Asthma- chronic problem, following w/ allergist ([facility]).  Currently asymptomatic, no SOB or wheezing.  No cough. Physical Activity: active but no formal exercise Fall Risk: low Depression: denies current sxs Hearing: normal to conversational tones and whispered voice at 6 ft ADL's: independent Cognitive: normal linear thought process, memory and attention intact Home Safety: safe at home Height, Weight, BMI, Visual Acuity: see vitals, vision corrected to 20/20 w/ glasses (Following w/ Dr Sabra Heck) Counseling: UTD on colonoscopy (due 2020), mammo (Dec), GYN Labs Ordered: See A&P Care Plan: See A&P    Review of Systems Patient reports no vision/ hearing changes, adenopathy,fever, weight change,  persistant/recurrent hoarseness , swallowing issues, chest pain, palpitations, edema, persistant/recurrent cough, hemoptysis, dyspnea (rest/exertional/paroxysmal nocturnal), gastrointestinal bleeding (melena, rectal bleeding), significant heartburn, bowel changes, GU symptoms (dysuria, hematuria, incontinence), Gyn symptoms (abnormal  bleeding, pain),  syncope, focal weakness, memory loss, numbness & tingling, skin/hair/nail changes, abnormal bruising or bleeding, anxiety, or depression.   + L sided abdominal pain- 2 separate episodes, pt felt a lump in her stomach.    Objective:   Physical Exam General Appearance:    Alert, cooperative, no distress, appears stated age  Head:    Normocephalic, without obvious abnormality, atraumatic  Eyes:    PERRL, conjunctiva/corneas clear, EOM's intact, fundi    benign, both eyes  Ears:    Normal TM's and external ear canals, both  ears  Nose:   Nares normal, septum midline, mucosa normal, no drainage    or sinus tenderness  Throat:   Lips, mucosa, and tongue normal; teeth and gums normal  Neck:   Supple, symmetrical, trachea midline, no adenopathy;    Thyroid: no enlargement/tenderness/nodules  Back:     Symmetric, no curvature, ROM normal, no CVA tenderness  Lungs:     Clear to auscultation bilaterally, respirations unlabored  Chest Wall:    No tenderness or deformity   Heart:    Regular rate and rhythm, S1 and S2 normal, no murmur, rub   or gallop  Breast Exam:    Deferred to GYN  Abdomen:     Soft, non-tender, bowel sounds active all four quadrants,    no masses, no organomegaly  Genitalia:    Deferred to GYN  Rectal:    Extremities:   Extremities normal, atraumatic, no cyanosis or edema  Pulses:   2+ and symmetric all extremities  Skin:   Skin color, texture, turgor normal, no rashes or lesions  Lymph nodes:   Cervical, supraclavicular, and axillary nodes normal  Neurologic:   CNII-XII intact, normal strength, sensation and reflexes    throughout          Assessment & Plan:

## 2014-11-04 NOTE — Assessment & Plan Note (Signed)
Pt is currently asymptomatic but hx of abnormal TSH.  Will repeat labs today to ensure WNL.

## 2014-11-04 NOTE — Assessment & Plan Note (Signed)
New.  Asymptomatic today.  No abnormalities on PE.  Check labs.  Pt to call Dr Olevia Perches if sxs persist.  Pt expressed understanding and is in agreement w/ plan.

## 2014-11-04 NOTE — Patient Instructions (Signed)
Follow up in 6 months to recheck BP We'll notify you of your lab results and make any changes if needed Keep up the good work!  You look great! The order for the bone density is in for when you schedule your mammo Call Dr Olevia Perches about the abd pain- your exam is normal today Call with any questions or concerns Have a great summer!!!!

## 2014-11-04 NOTE — Assessment & Plan Note (Signed)
Chronic problem.  Adequate control.  Asymptomatic.  Check labs.  No anticipated med changes 

## 2014-11-04 NOTE — Assessment & Plan Note (Signed)
Pt's PE unchanged from previous.  UTD on mammo- due for DEXA (ordered for later this year).  UTD on colonoscopy w/ Dr Olevia Perches (due in 2020).  UTD w/ Dr Leo Grosser (GYN).  Written screening schedule updated and given to pt.  Check labs.  Anticipatory guidance provided.

## 2014-11-04 NOTE — Assessment & Plan Note (Signed)
Chronic problem.  Currently asymptomatic.  Following w/ Dr Verlin Fester (allergist).  Lungs CTAB today.

## 2014-11-07 ENCOUNTER — Other Ambulatory Visit (HOSPITAL_COMMUNITY): Payer: Medicare Other

## 2014-11-11 LAB — HM DEXA SCAN

## 2014-11-14 ENCOUNTER — Ambulatory Visit: Payer: Medicare Other | Admitting: Family Medicine

## 2014-11-14 ENCOUNTER — Telehealth: Payer: Self-pay | Admitting: Family Medicine

## 2014-11-14 NOTE — Telephone Encounter (Signed)
Please talk to pt about her weight loss.  She was 122 lbs earlier this month at her CPE

## 2014-11-14 NOTE — Telephone Encounter (Signed)
Patient scheduled for 11/15/14 with Dr. Birdie Riddle.

## 2014-11-14 NOTE — Telephone Encounter (Signed)
If she has lost 10 lbs in 2 weeks, she most definitely needs an appt for evaluation.

## 2014-11-14 NOTE — Telephone Encounter (Signed)
Patient states this morning she was 112lbs.  She states she has no appetite, no desire to eat.  She has no nausea, pain, or mood changes- just does not have any desire for food.  She has been trying to drink Ensures to keep calories up.  She has an appointment with Dr. Olevia Perches on 7/26, but states her last colonoscopy was recently and was normal.  She would like to know if she should be seen before this appointment?    Please advise.

## 2014-11-14 NOTE — Telephone Encounter (Signed)
Caller name: Chaunda Relation to pt: Call back number: [phone] Pharmacy:  Reason for call:   Patient states that she is losing more weight and is concerned about this. She states that her labs at last visit were normal.

## 2014-11-14 NOTE — Telephone Encounter (Signed)
Last OV was 11-04-14, please advise. Would you like pt to be seen?

## 2014-11-15 ENCOUNTER — Encounter: Payer: Self-pay | Admitting: Family Medicine

## 2014-11-15 ENCOUNTER — Ambulatory Visit (INDEPENDENT_AMBULATORY_CARE_PROVIDER_SITE_OTHER): Payer: Medicare Other | Admitting: Family Medicine

## 2014-11-15 ENCOUNTER — Ambulatory Visit (HOSPITAL_BASED_OUTPATIENT_CLINIC_OR_DEPARTMENT_OTHER)
Admission: RE | Admit: 2014-11-15 | Discharge: 2014-11-15 | Disposition: A | Discharge status: Home / Self Care | Payer: Medicare Other | Source: Ambulatory Visit | Attending: Family Medicine | Admitting: Family Medicine

## 2014-11-15 VITALS — BP 112/72 | HR 58 | Temp 98.0°F | Resp 16 | Ht 62.0 in | Wt 114.0 lb

## 2014-11-15 DIAGNOSIS — M47894 Other spondylosis, thoracic region: Secondary | ICD-10-CM | POA: Diagnosis not present

## 2014-11-15 DIAGNOSIS — R634 Abnormal weight loss: Secondary | ICD-10-CM | POA: Diagnosis not present

## 2014-11-15 DIAGNOSIS — R05 Cough: Secondary | ICD-10-CM | POA: Insufficient documentation

## 2014-11-15 DIAGNOSIS — F419 Anxiety disorder, unspecified: Secondary | ICD-10-CM

## 2014-11-15 LAB — BASIC METABOLIC PANEL
BUN: 14 mg/dL (ref 6–23)
CHLORIDE: 105 meq/L (ref 96–112)
CO2: 26 mEq/L (ref 19–32)
Calcium: 10 mg/dL (ref 8.4–10.5)
Creatinine, Ser: 0.71 mg/dL (ref 0.40–1.20)
GFR: 103.58 mL/min (ref >60.00)
Glucose, Bld: 112 mg/dL — ABNORMAL HIGH (ref 70–99)
Potassium: 4.1 mEq/L (ref 3.5–5.1)
SODIUM: 137 meq/L (ref 135–145)

## 2014-11-15 LAB — SEDIMENTATION RATE: Sed Rate: 27 mm/hr — ABNORMAL HIGH (ref 0–22)

## 2014-11-15 LAB — CBC WITH DIFFERENTIAL/PLATELET
BASOS ABS: 0 10*3/uL (ref 0.0–0.1)
BASOS PCT: 0.5 % (ref 0.0–3.0)
Eosinophils Absolute: 0.1 10*3/uL (ref 0.0–0.7)
Eosinophils Relative: 1 % (ref 0.0–5.0)
HEMATOCRIT: 38.6 % (ref 36.0–46.0)
HEMOGLOBIN: 12.8 g/dL (ref 12.0–15.0)
LYMPHS ABS: 1.6 10*3/uL (ref 0.7–4.0)
LYMPHS PCT: 28.7 % (ref 12.0–46.0)
MCHC: 33.1 g/dL (ref 30.0–36.0)
MCV: 91.4 fl (ref 78.0–100.0)
Monocytes Absolute: 0.6 10*3/uL (ref 0.1–1.0)
Monocytes Relative: 9.8 % (ref 3.0–12.0)
Neutro Abs: 3.4 10*3/uL (ref 1.4–7.7)
Neutrophils Relative %: 60 % (ref 43.0–77.0)
Platelets: 333 10*3/uL (ref 150.0–400.0)
RBC: 4.22 Mil/uL (ref 3.87–5.11)
RDW: 13.5 % (ref 11.5–15.5)
WBC: 5.6 10*3/uL (ref 4.0–10.5)

## 2014-11-15 LAB — T4, FREE: Free T4: 0.94 ng/dL (ref 0.60–1.60)

## 2014-11-15 LAB — TSH: TSH: 0.96 u[IU]/mL (ref 0.35–4.50)

## 2014-11-15 LAB — T3, FREE: T3, Free: 3 pg/mL (ref 2.3–4.2)

## 2014-11-15 MED ORDER — LORAZEPAM 1 MG PO TABS: 1.0000 mg | ORAL_TABLET | Freq: Two times a day (BID) | ORAL | Status: DC

## 2014-11-15 NOTE — Assessment & Plan Note (Signed)
Deteriorated.  Discussed daily controller medication like Zoloft or Remeron (which would improve appetite) w/ pt but at this time pt declines and prefers to increase Lorazepam.  Will increase to '1mg'$  BID prn and pt advised that if this dose is too strong, she can go back to 0.'5mg'$  and take 3x/day.  Pt expressed understanding and is in agreement w/ plan.

## 2014-11-15 NOTE — Progress Notes (Signed)
   Subjective:    Patient ID: Amanda Jackson, female    DOB: Apr 06, 1941, 74 y.o.   MRN: [id number]  HPI Weight loss- pt has lost 8 lbs in 11 days.  Reports loss of appetite.  Pt had a can of soup last night 'and i couldn't eat it all'.  Increased fatigue and SOB.  Has appt w/ Dr Olevia Perches upcoming.  Having sweats and chills.  Occasional night sweats.  + cough, 'very loose'.  Pt reports cough is easing- pt completed abx course from Dr Ernesto Rutherford.  Recent lab work looked good.  Reports 'i'm in a slump and have been for awhile'.  States she has had increased anxiety recently due to stressors re: her son.  Notes that when she is anxious she has increased tremor of hands and 'i feel jittery'.  Pt is asking to increase her lorazepam.   Review of Systems For ROS see HPI     Objective:   Physical Exam  Constitutional: She is oriented to person, place, and time. She appears well-developed. No distress.  thin  HENT:  Head: Normocephalic and atraumatic.  Eyes: Conjunctivae and EOM are normal. Pupils are equal, round, and reactive to light.  Neck: Normal range of motion. Neck supple. No thyromegaly present.  Cardiovascular: Normal rate, regular rhythm, normal heart sounds and intact distal pulses.   No murmur heard. Pulmonary/Chest: Effort normal and breath sounds normal. No respiratory distress.  Abdominal: Soft. She exhibits no distension. There is no tenderness.  Musculoskeletal: She exhibits no edema.  Lymphadenopathy:    She has no cervical adenopathy.  Neurological: She is alert and oriented to person, place, and time.  Skin: Skin is warm and dry.  Psychiatric: She has a normal mood and affect. Her behavior is normal.  Vitals reviewed.         Assessment & Plan:

## 2014-11-15 NOTE — Assessment & Plan Note (Signed)
Deteriorated.  Pt has lost 8 lbs in 11 days.  Reports little to no appetite.  Increased anxiety.  Alternating chills/sweats but no documented fevers.  Check labs, urine, CXR to r/o infxn, electrolyte or thyroid abnormalities.  Discussed need for pt to eat regardless if she is hungry or not.  She is to add Ensure daily.  May need to add Remeron to boost appetite in the near future.  Discussed that her anxiety may be playing a part.  Has upcoming appt w/ GI for complete evaluation.  Will follow closely.

## 2014-11-15 NOTE — Patient Instructions (Signed)
Follow up in 2 weeks to recheck weight We'll notify you of your lab results and make any changes if needed Please stop downstairs and get your chest xray Make sure you are drinking at least 1 Ensure daily Increase the Lorazepam to '1mg'$  twice daily- if the '1mg'$  is too much, break it in half (0.'5mg'$ ) and take 3x/day rather than twice daily Try and eat regularly- even if you don't feel like it Call with any questions or concerns Hang in there!!!

## 2014-11-15 NOTE — Progress Notes (Signed)
Pre visit review using our clinic review tool, if applicable. No additional management support is needed unless otherwise documented below in the visit note. 

## 2014-11-18 ENCOUNTER — Other Ambulatory Visit (INDEPENDENT_AMBULATORY_CARE_PROVIDER_SITE_OTHER): Payer: Medicare Other

## 2014-11-18 DIAGNOSIS — R7303 Prediabetes: Secondary | ICD-10-CM

## 2014-11-18 DIAGNOSIS — R7309 Other abnormal glucose: Secondary | ICD-10-CM

## 2014-11-18 LAB — HEMOGLOBIN A1C: HEMOGLOBIN A1C: 5.4 % (ref 4.6–6.5)

## 2014-11-19 ENCOUNTER — Inpatient Hospital Stay: Admit: 2014-11-19 | Payer: Self-pay | Admitting: Orthopaedic Surgery

## 2014-11-19 ENCOUNTER — Ambulatory Visit (INDEPENDENT_AMBULATORY_CARE_PROVIDER_SITE_OTHER): Payer: Medicare Other | Admitting: Internal Medicine

## 2014-11-19 ENCOUNTER — Encounter: Payer: Self-pay | Admitting: Internal Medicine

## 2014-11-19 VITALS — BP 100/64 | HR 62 | Ht 62.0 in | Wt 116.4 lb

## 2014-11-19 DIAGNOSIS — R195 Other fecal abnormalities: Secondary | ICD-10-CM

## 2014-11-19 DIAGNOSIS — R634 Abnormal weight loss: Secondary | ICD-10-CM | POA: Diagnosis not present

## 2014-11-19 SURGERY — ARTHROPLASTY, KNEE, TOTAL
Anesthesia: Spinal | Laterality: Left

## 2014-11-19 NOTE — Progress Notes (Signed)
Amanda Jackson [birth year] [id number]  Note: This dictation was prepared with Dragon digital system. Any transcriptional errors that result from this procedure are unintentional.   History of Present Illness: This is a 74 year old African-American female seen in the past for colorectal screening. Prior colonoscopy in 2010 showed tubular adenoma. She underwent right hemicolectomy in 2004. Most recent colonoscopy December 2015 showed small polyp which turned out to be benign colonic mucosa. There is strong family history of colon cancer in her mother and sister. Day because of weight loss. She weighed 129 pounds in March 2016 currently she is done the 116 pounds. She denies rectal bleeding but has occasional left lower quadrant abdominal discomfort. His occurs and she is up on her feet. She also has occasional indigestion for which she takes omeprazole. She denies dysphagia, odynophagia. There is a history of anxiety. And of the upper abdomen in 2005 showed gallstones and hepatic cysts. Her recent sedimentation rate was 27    Past Medical History  Diagnosis Date  . Hypertension   . Atypical chest pain     Normal Myoview 2008  . Seasonal allergies   . Hx of colonoscopy 03/18/09  . Anxiety   . MVP (mitral valve prolapse)   . Asthma   . Tubulovillous adenoma   . Arthritis   . Colon polyp   . Allergy   . GERD (gastroesophageal reflux disease)   . Hyperlipidemia   . Left nasal polyps     Past Surgical History  Procedure Laterality Date  . Laparoscopic right colectomy  01/25/03  . Knee arthroscopy w/ lateral release  11/17/99    Left  . Knee arthroscopy w/ partial medial meniscectomy  11/17/99    left  . Functional endoscopic sinus surgery  08/28/08    with bilateral ethmoidectomies and bilateral maxillary sinus ostial enlargement with removal of mucocyst, mucous membrane, and mucoid material  . Abdominal hysterectomy    . Breast surgery  2010    biopsy-benign  . Tonsillectomy  1952  .  Polypectomy    . Colonoscopy    . Bunionectomy    . Nasal polyp removal      No Known Allergies  Family history and social history have been reviewed.  Review of Systems:   The remainder of the 10 point ROS is negative except as outlined in the H&P  Physical Exam: General Appearance thin in no distress Eyes  Non icteric  HEENT  Non traumatic, normocephalic  Mouth No lesion, tongue papillated, no cheilosis Neck Supple without adenopathy, thyroid not enlarged, no carotid bruits, no JVD Lungs Clear to auscultation bilaterally COR Normal S1, normal S2, regular rhythm, no murmur, quiet precordium Abdomen soft relaxed nontender. No palpable mass. Costal margin. Rectal yellow Hemoccult-positive stool Extremities  No pedal edema Skin No lesions Neurological Alert and oriented x 3 Psychological Normal mood and affect  Assessment and Plan:   74 year old African-American female with heme positive stool and weight loss of at least 10 pounds over past 6 months. She has occasional left lower quadrant abdominal discomfort. She has a  strong family history of colorectal cancer and had adenomatous polyps in the past and underwent right hemicolectomy in 2004. We will proceed with CT scan of the abdomen and pelvis with oral and IV contrast . Depending on the results she will probably need upper endoscopy and colonoscopy. Her hemoglobin is normal at 12.8 hematocrit 38.6. MCV 91.  Gallstones by ultrasound in 2004. CT scan will check on that  Amanda Jackson 11/19/2014

## 2014-11-19 NOTE — Patient Instructions (Addendum)
You have been scheduled for a CT scan of the abdomen and pelvis at [facility] ([address]---this is in the same building as [facility]).   You are scheduled on 11/21/14 at 9:30am. You should arrive 15 minutes prior to your appointment time for registration. Please follow the written instructions below on the day of your exam:  WARNING: IF YOU ARE ALLERGIC TO IODINE/X-RAY DYE, PLEASE NOTIFY RADIOLOGY IMMEDIATELY AT [phone]! YOU WILL BE GIVEN A 13 HOUR PREMEDICATION PREP.  1) Do not eat or drink anything after 5:30am (4 hours prior to your test) 2) You have been given 2 bottles of oral contrast to drink. The solution may taste better if refrigerated, but do NOT add ice or any other liquid to this solution. Shake well before drinking.    Drink 1 bottle of contrast @ 7:30am (2 hours prior to your exam)  Drink 1 bottle of contrast @ 8:30am (1 hour prior to your exam)  You may take any medications as prescribed with a small amount of water except for the following: Metformin, Glucophage, Glucovance, Avandamet, Riomet, Fortamet, Actoplus Met, Janumet, Glumetza or Metaglip. The above medications must be held the day of the exam AND 48 hours after the exam.  The purpose of you drinking the oral contrast is to aid in the visualization of your intestinal tract. The contrast solution may cause some diarrhea. Before your exam is started, you will be given a small amount of fluid to drink. Depending on your individual set of symptoms, you may also receive an intravenous injection of x-ray contrast/dye. Plan on being at [facility] for 30 minutes or long, depending on the type of exam you are having performed.  This test typically takes 30-45 minutes to complete.  If you have any questions regarding your exam or if you need to reschedule, you may call the CT department at [phone] between the hours of 8:00 am and 5:00 pm,  Monday-Friday.  ________________________________________________________________________  Dr Birdie Riddle

## 2014-11-21 ENCOUNTER — Ambulatory Visit (INDEPENDENT_AMBULATORY_CARE_PROVIDER_SITE_OTHER)
Admission: RE | Admit: 2014-11-21 | Discharge: 2014-11-21 | Disposition: A | Discharge status: Home / Self Care | Payer: Medicare Other | Source: Ambulatory Visit | Attending: Internal Medicine | Admitting: Internal Medicine

## 2014-11-21 DIAGNOSIS — R634 Abnormal weight loss: Secondary | ICD-10-CM | POA: Diagnosis not present

## 2014-11-21 DIAGNOSIS — R195 Other fecal abnormalities: Secondary | ICD-10-CM

## 2014-11-22 ENCOUNTER — Other Ambulatory Visit: Payer: Self-pay

## 2014-11-22 DIAGNOSIS — D649 Anemia, unspecified: Secondary | ICD-10-CM

## 2014-12-02 ENCOUNTER — Encounter: Payer: Self-pay | Admitting: Family Medicine

## 2014-12-02 ENCOUNTER — Ambulatory Visit (INDEPENDENT_AMBULATORY_CARE_PROVIDER_SITE_OTHER): Payer: Medicare Other | Admitting: Family Medicine

## 2014-12-02 VITALS — BP 104/68 | HR 100 | Temp 97.9°F | Resp 16 | Ht 62.0 in | Wt 116.5 lb

## 2014-12-02 DIAGNOSIS — R634 Abnormal weight loss: Secondary | ICD-10-CM

## 2014-12-02 NOTE — Progress Notes (Signed)
Pre visit review using our clinic review tool, if applicable. No additional management support is needed unless otherwise documented below in the visit note. 

## 2014-12-02 NOTE — Patient Instructions (Signed)
Follow up in 6-8 weeks to recheck weight Continue to drink the Ensure daily (or every other day if daily is too much) Make sure you are eating regularly I would hold off on knee replacement until we have gained back to 120ish so if you were to lose after surgery, we have a little more of a cushion Call with any questions or concerns Enjoy the rest of your summer!!!

## 2014-12-02 NOTE — Assessment & Plan Note (Signed)
Pt's weight has stabilized since last visit.  She has gained nearly a lb since last visit.  Pt is now drinking Ensure daily.  Reports she is trying to eat more regularly.  Labs and CT scan were reassuring.  Will continue to follow.

## 2014-12-02 NOTE — Progress Notes (Signed)
   Subjective:    Patient ID: Amanda Jackson, female    DOB: 1941/04/24, 74 y.o.   MRN: [id number]  HPI Weight loss- pt's weight is stable since visit 7/26.  Taking Ensure daily.  Had CT scan of abd and pelvis w/ GI which was normal.  Denies N/V, fevers, chills, night sweats.  No CP, SOB.  Pt admits that she was under a lot of stress recently and feels this was contributing to her anorexia.   Review of Systems For ROS see HPI     Objective:   Physical Exam  Constitutional: She is oriented to person, place, and time. She appears well-developed and well-nourished. No distress.  HENT:  Head: Normocephalic and atraumatic.  Eyes: Conjunctivae and EOM are normal. Pupils are equal, round, and reactive to light.  Neck: Normal range of motion. Neck supple. No thyromegaly present.  Cardiovascular: Normal rate, regular rhythm, normal heart sounds and intact distal pulses.   No murmur heard. Pulmonary/Chest: Effort normal and breath sounds normal. No respiratory distress.  Abdominal: Soft. She exhibits no distension. There is no tenderness.  Musculoskeletal: She exhibits no edema.  Lymphadenopathy:    She has no cervical adenopathy.  Neurological: She is alert and oriented to person, place, and time.  Skin: Skin is warm and dry.  Psychiatric: She has a normal mood and affect. Her behavior is normal.  Vitals reviewed.         Assessment & Plan:

## 2014-12-04 ENCOUNTER — Encounter: Payer: Self-pay | Admitting: Family Medicine

## 2014-12-04 ENCOUNTER — Telehealth: Payer: Self-pay | Admitting: Family Medicine

## 2014-12-04 NOTE — Telephone Encounter (Signed)
Caller name: Relation to BE:MLJQ Call back [place]:  Reason for call: pt would like to know if dr.Tabori can send Dr.Dalldorf and letter stating that she has suggested that the pt gains weight before having the knee surgery, pt would like it faxed however she does not have the fax number, states her and dr. Birdie Riddle had talked about this on her visit on Monday.

## 2014-12-23 ENCOUNTER — Other Ambulatory Visit: Payer: Medicare Other

## 2014-12-23 DIAGNOSIS — D649 Anemia, unspecified: Secondary | ICD-10-CM

## 2014-12-23 LAB — HEMOCCULT SLIDES (X 3 CARDS)
Fecal Occult Blood: NEGATIVE
OCCULT 1: NEGATIVE
OCCULT 2: NEGATIVE
OCCULT 3: NEGATIVE
OCCULT 4: NEGATIVE
OCCULT 5: NEGATIVE

## 2014-12-23 NOTE — Progress Notes (Signed)
Quick Note:  I reviewed Dr. Nichola Sizer notes and Dr. Virgil Benedict notes I am inclined to see how she does and have her f/u Dr. Birdie Riddle for now and let Dr. Birdie Riddle send her back to [place] if needed - unless Dr. Birdie Riddle thinks otherwise  I copied Dr. Birdie Riddle ______

## 2015-01-22 ENCOUNTER — Encounter: Payer: Self-pay | Admitting: Family Medicine

## 2015-01-22 ENCOUNTER — Ambulatory Visit (INDEPENDENT_AMBULATORY_CARE_PROVIDER_SITE_OTHER): Payer: Medicare Other | Admitting: Family Medicine

## 2015-01-22 VITALS — BP 110/68 | HR 69 | Temp 98.1°F | Resp 16 | Wt 118.2 lb

## 2015-01-22 DIAGNOSIS — R634 Abnormal weight loss: Secondary | ICD-10-CM

## 2015-01-22 DIAGNOSIS — J302 Other seasonal allergic rhinitis: Secondary | ICD-10-CM | POA: Diagnosis not present

## 2015-01-22 NOTE — Progress Notes (Signed)
   Subjective:    Patient ID: Amanda Jackson, female    DOB: Sep 01, 1940, 74 y.o.   MRN: [id number]  HPI Weight loss- pt is up another lb since last visit.  Pt reports feeling better.  Eating regularly, 'i'm doing better now'.  Taking Ensure daily.  'sinus infection'- pt had recent sinus surgery.  Pt reports increased facial pain/pressure.  Pt reports bright yellow mucous.  No fevers.  + frontal HA.  No known sick contacts.  No N/V.  Has appt w/ ENT Ernesto Rutherford) on 10/4.  Was taking OTC Claritin but stopped.  On Singulair daily.  Using nasal steroid spray.   Review of Systems For ROS see HPI     Objective:   Physical Exam  Constitutional: She appears well-developed and well-nourished. No distress.  HENT:  Head: Normocephalic and atraumatic.  Right Ear: Tympanic membrane normal.  Left Ear: Tympanic membrane normal.  Nose: Mucosal edema and rhinorrhea present. Right sinus exhibits no maxillary sinus tenderness and no frontal sinus tenderness. Left sinus exhibits no maxillary sinus tenderness and no frontal sinus tenderness.  Mouth/Throat: Mucous membranes are normal. Posterior oropharyngeal erythema (w/ PND) present.  Eyes: Conjunctivae and EOM are normal. Pupils are equal, round, and reactive to light.  Neck: Normal range of motion. Neck supple.  Cardiovascular: Normal rate, regular rhythm and normal heart sounds.   Pulmonary/Chest: Effort normal and breath sounds normal. No respiratory distress. She has no wheezes. She has no rales.  Lymphadenopathy:    She has no cervical adenopathy.  Vitals reviewed.         Assessment & Plan:

## 2015-01-22 NOTE — Patient Instructions (Signed)
Follow up in 6-8 weeks to recheck weight (by this time we should be ready for knee surgery) Continue to take your Ensure daily and eat at least 3 meals/day Restart Claritin (Loratidine) or Zyrtec(Cetirizine) daily during this difficult allergy season No need for antibiotics today but please let me know if your symptoms change or worsen Call with any questions or concerns If you want to join [place] at the new [place] office, any scheduled appointments will automatically transfer and we will see you at [address] [place] [address] [place], [place] [address]  Happy Fall!!

## 2015-01-22 NOTE — Assessment & Plan Note (Signed)
Deteriorated.  Pt has ENT f/u next week after her recent surgery.  She is afebrile.  No TTP over sinuses.  No obvious evidence of infxn.  Will hold on abx today but have her restart her daily antihistamine.  Reviewed supportive care and red flags that should prompt return.  Pt expressed understanding and is in agreement w/ plan.

## 2015-01-22 NOTE — Assessment & Plan Note (Signed)
Pt has gained another lb since last visit.  Reports she is eating more regularly and taking her Ensure daily.  Goal is to get back to 120lbs prior to knee surgery.

## 2015-01-22 NOTE — Progress Notes (Signed)
Pre visit review using our clinic review tool, if applicable. No additional management support is needed unless otherwise documented below in the visit note. 

## 2015-01-25 ENCOUNTER — Other Ambulatory Visit: Payer: Self-pay | Admitting: Family Medicine

## 2015-01-28 NOTE — Telephone Encounter (Signed)
Last OV 01/22/15 Lorazepam last filled 11/15/14 #60 with 1  Low risk, uds needed

## 2015-01-28 NOTE — Telephone Encounter (Signed)
Medication filled to pharmacy as requested.   

## 2015-02-06 ENCOUNTER — Other Ambulatory Visit: Payer: Self-pay | Admitting: Pediatrics

## 2015-02-06 MED ORDER — MONTELUKAST SODIUM 10 MG PO TABS
10.0000 mg | ORAL_TABLET | Freq: Every day | ORAL | Status: DC
Start: 2015-02-06 — End: 2015-08-30

## 2015-02-17 ENCOUNTER — Encounter: Payer: Self-pay | Admitting: Internal Medicine

## 2015-03-12 ENCOUNTER — Ambulatory Visit (INDEPENDENT_AMBULATORY_CARE_PROVIDER_SITE_OTHER): Payer: Medicare Other | Admitting: Family Medicine

## 2015-03-12 ENCOUNTER — Encounter: Payer: Self-pay | Admitting: Family Medicine

## 2015-03-12 VITALS — BP 120/70 | HR 71 | Temp 97.9°F | Resp 16 | Ht 62.0 in | Wt 120.5 lb

## 2015-03-12 DIAGNOSIS — R634 Abnormal weight loss: Secondary | ICD-10-CM | POA: Diagnosis not present

## 2015-03-12 DIAGNOSIS — Z23 Encounter for immunization: Secondary | ICD-10-CM

## 2015-03-12 NOTE — Progress Notes (Signed)
Pre visit review using our clinic review tool, if applicable. No additional management support is needed unless otherwise documented below in the visit note. 

## 2015-03-12 NOTE — Patient Instructions (Signed)
Follow up as scheduled in January You are good to proceed w/ your knee surgery Continue to eat regularly- you look great! Call with any questions or concerns If you want to join [place] at the new [place] office, any scheduled appointments will automatically transfer and we will see you at [address] [place] [address] Amanda Jackson, [place] [address] ([place] 04/29/15) [place]!!!

## 2015-03-12 NOTE — Progress Notes (Signed)
   Subjective:    Patient ID: Amanda Jackson, female    DOB: 1940/11/13, 74 y.o.   MRN: [id number]  HPI Weight loss- pt has gained almost 3 lbs since last visit, placing her above the 120 lb mark.  Eating regularly- 'i have an appetite again'.  No longer feeling as stressed as previous.  Denies CP, SOB, abd pain, N/V.  Pt due for flu shot today.   Review of Systems For ROS see HPI     Objective:   Physical Exam        Assessment & Plan:

## 2015-03-12 NOTE — Assessment & Plan Note (Signed)
Pt has regained weight and is now above the 120 lb threshold to proceed w/ surgery.  She is eating regularly, denies abd pain, N/V/D.  She will proceed w/ knee replacement.  Discussed importance of transitional rehab to ensure she is getting her meals regularly w/o the stress of preparing them.  Discussed a few potential options.  Will assist as pt needs.  Pt expressed understanding and is in agreement w/ plan.

## 2015-03-18 ENCOUNTER — Telehealth: Payer: Self-pay | Admitting: Allergy and Immunology

## 2015-03-18 NOTE — Telephone Encounter (Signed)
ADVISED PT CAN GIVE SAMPLE QVAR 80 BUT NO QNASL TO GIVE.Marland KitchenLEFT SAMPLE UP FRONT

## 2015-03-18 NOTE — Telephone Encounter (Signed)
Pt called to see if she could pick up some samples of Qnasal and Qvar. Dr. Starling Manns told her to call if she needed samples. Pt also made an apt to see Dr.Bobbitt next week pls call and let her know either way and leave a vm if she doesn't pick-up

## 2015-03-25 ENCOUNTER — Ambulatory Visit (INDEPENDENT_AMBULATORY_CARE_PROVIDER_SITE_OTHER): Payer: Medicare Other | Admitting: Allergy and Immunology

## 2015-03-25 ENCOUNTER — Encounter: Payer: Self-pay | Admitting: Allergy and Immunology

## 2015-03-25 VITALS — BP 118/70 | HR 80 | Temp 97.5°F | Resp 18

## 2015-03-25 DIAGNOSIS — J339 Nasal polyp, unspecified: Secondary | ICD-10-CM | POA: Diagnosis not present

## 2015-03-25 DIAGNOSIS — J45901 Unspecified asthma with (acute) exacerbation: Secondary | ICD-10-CM | POA: Diagnosis not present

## 2015-03-25 DIAGNOSIS — J019 Acute sinusitis, unspecified: Secondary | ICD-10-CM | POA: Insufficient documentation

## 2015-03-25 DIAGNOSIS — J0110 Acute frontal sinusitis, unspecified: Secondary | ICD-10-CM

## 2015-03-25 MED ORDER — LEVALBUTEROL HCL 1.25 MG/3ML IN NEBU
1.2500 mg | INHALATION_SOLUTION | Freq: Once | RESPIRATORY_TRACT | Status: AC
  Administered 2015-03-25: 1.25 mg via RESPIRATORY_TRACT

## 2015-03-25 MED ORDER — IPRATROPIUM BROMIDE 0.02 % IN SOLN
0.5000 mg | Freq: Once | RESPIRATORY_TRACT | Status: AC
  Administered 2015-03-25: 0.5 mg via RESPIRATORY_TRACT

## 2015-03-25 MED ORDER — PREDNISONE 1 MG PO TABS: 10.0000 mg | ORAL_TABLET | ORAL | Status: DC

## 2015-03-25 NOTE — Patient Instructions (Addendum)
Asthma with acute exacerbation  Prednisone has been provided, 20 mg x 4 days, 10 mg x1 day, then stop.  For now, increase Qvar 80 g to 3 inhalations via spacer device 3 times per day.  When exacerbation has resolved, she may return to previous dose of 2 inhalations via spacer device twice a day.  If needed, we will step up therapy to ICS/LABA or tiotropium.   Acute sinusitis  Prednisone has been provided (as above).  Continue Qnasl 80 g, one actuation per nostril twice a day.  I recommended nasal saline lavage rather than nasal saline spray for now.  The patient has been asked to contact me if her symptoms persist, progress, or if she becomes febrile. Otherwise, she may return for follow up in 4 months.  History of nasal polyps  Continue nasal saline followed by Qnasl 80 g, one actuation per nostril twice a day.    Return in about 4 months (around 07/23/2015), or if symptoms worsen or fail to improve.

## 2015-03-25 NOTE — Assessment & Plan Note (Signed)
   Continue nasal saline followed by Qnasl 80 g, one actuation per nostril twice a day.

## 2015-03-25 NOTE — Progress Notes (Signed)
History of present illness: HPI Comments: Amanda Jackson is a 74 y.o. female with persistent asthma, allergic rhinitis, and history of nasal polyps who presents today for sick visit.  She reports that over the past month she has experienced frequent coughing with thick mucus production, dyspnea, and wheezing.  She has increased albuterol requirement and is awakened from her sleep with lower respiratory symptoms 3 or 4 nights per week.  She has also been experiencing nasal congestion, frontal sinus pressure, and thick postnasal drainage.  She denies fevers or chills.   Assessment and plan: Asthma with acute exacerbation  Prednisone has been provided, 20 mg x 4 days, 10 mg x1 day, then stop.  For now, increase Qvar 80 g to 3 inhalations via spacer device 3 times per day.  When exacerbation has resolved, she may return to previous dose of 2 inhalations via spacer device twice a day.  If needed, we will step up therapy to ICS/LABA or tiotropium.   Acute sinusitis  Prednisone has been provided (as above).  Continue Qnasl 80 g, one actuation per nostril twice a day.  I recommended nasal saline lavage rather than nasal saline spray for now.  The patient has been asked to contact me if her symptoms persist, progress, or if she becomes febrile. Otherwise, she may return for follow up in 4 months.  History of nasal polyps  Continue nasal saline followed by Qnasl 80 g, one actuation per nostril twice a day.    Medications ordered this encounter: Meds ordered this encounter  Medications  . levalbuterol (XOPENEX) nebulizer solution 1.25 mg    Sig:   . ipratropium (ATROVENT) nebulizer solution 0.5 mg    Sig:   . predniSONE (DELTASONE) tablet 10 mg    Sig:     Diagnositics: Spirometry: FVC was 1.78 L (79% predicted) and FEV1 is 1.18 L (68% predicted) with significant (200 mL, 17%) post bronchodilator improvement.     Physical examination: Blood pressure 118/70, pulse 80,  temperature 97.5 F (36.4 C), temperature source Oral, resp. rate 18.  General: Alert, interactive, in no acute distress. HEENT: TMs pearly gray, turbinates moderately edematous without discharge, post-pharynx moderately erythematous. Neck: Supple without lymphadenopathy. Lungs: Mildly decreased breath sounds bilaterally without wheezing, rhonchi or rales. CV: Normal S1, S2 without murmurs. Skin: Warm and dry, without lesions or rashes.  The following portions of the patient's history were reviewed and updated as appropriate: allergies, current medications, past family history, past medical history, past social history, past surgical history and problem list.  Outpatient medications:   Medication List       This list is accurate as of: 03/25/15 12:49 PM.  Always use your most recent med list.               amLODipine 10 MG tablet  Commonly known as:  NORVASC  TAKE 1 TABLET (10 MG TOTAL) BY MOUTH 2 (TWO) TIMES DAILY.     amLODipine 10 MG tablet  Commonly known as:  NORVASC  TAKE 1 TABLET (10 MG TOTAL) BY MOUTH 2 (TWO) TIMES DAILY.     aspirin 81 MG EC tablet  Take 81 mg by mouth daily.     Beclomethasone Dipropionate 80 MCG/ACT Aers  Place 1 Squirt into the nose 2 (two) times daily. Spray 1 spray in each nostril twice daily for nasal polyps     CALCIUM 600+D3 PO  Take 600 mg by mouth 2 (two) times daily.     carvedilol 12.5 MG tablet  Commonly known as:  COREG  TAKE ONE TABLET BY MOUTH TWICE DAILY WITH A MEAL     CENTRUM tablet  Take 1 tablet by mouth daily.     conjugated estrogens vaginal cream  Commonly known as:  PREMARIN  Place 0.5 g vaginally 2 (two) times a week. As needed for spotting     CORICIDIN HBP COUGH/COLD PO  Take by mouth.     folic acid-pyridoxine-cyancobalamin 2.5-25-2 MG Tabs tablet  Commonly known as:  FOLBIC  Take 1 tablet by mouth daily.     GUAIFENESIN PO  Take 1 tablet by mouth 2 (two) times daily.     ibuprofen 800 MG tablet   Commonly known as:  ADVIL,MOTRIN  TAKE 1 TABLET (800 MG TOTAL) BY MOUTH 3 (THREE) TIMES DAILY AS NEEDED FOR PAIN     LORazepam 1 MG tablet  Commonly known as:  ATIVAN  TAKE 1 TABLET BY MOUTH TWICE A DAY     losartan-hydrochlorothiazide 100-25 MG tablet  Commonly known as:  HYZAAR  TAKE 1 TABLET BY MOUTH DAILY.     montelukast 10 MG tablet  Commonly known as:  SINGULAIR  Take 1 tablet (10 mg total) by mouth daily.     nitroGLYCERIN 0.4 MG SL tablet  Commonly known as:  NITROSTAT  AS DIRECTED     omeprazole 20 MG capsule  Commonly known as:  PRILOSEC  Take 1 capsule (20 mg total) by mouth 2 (two) times daily.     potassium chloride SA 20 MEQ tablet  Commonly known as:  KLOR-CON M20  TAKE 2 TABLETS BY MOUTH 3 TIMES A DAY     PROVENTIL HFA 108 (90 BASE) MCG/ACT inhaler  Generic drug:  albuterol  Inhale 2 puffs into the lungs every 4 (four) hours as needed (for cough or wheezing).     QVAR 80 MCG/ACT inhaler  Generic drug:  beclomethasone     traMADol 50 MG tablet  Commonly known as:  ULTRAM  Take 50 mg by mouth every 6 (six) hours as needed. for pain     TYLENOL ARTHRITIS PAIN 650 MG CR tablet  Generic drug:  acetaminophen  Take 650 mg by mouth every 8 (eight) hours as needed for pain.        Known medication allergies: No Known Allergies  I appreciate the opportunity to take part in this Trystin's care. Please do not hesitate to contact me with questions.  Sincerely,   R. Edgar Frisk, MD

## 2015-03-25 NOTE — Assessment & Plan Note (Signed)
   Prednisone has been provided (as above).  Continue Qnasl 80 g, one actuation per nostril twice a day.  I recommended nasal saline lavage rather than nasal saline spray for now.  The patient has been asked to contact me if her symptoms persist, progress, or if she becomes febrile. Otherwise, she may return for follow up in 4 months.

## 2015-03-25 NOTE — Assessment & Plan Note (Signed)
   Prednisone has been provided, 20 mg x 4 days, 10 mg x1 day, then stop.  For now, increase Qvar 80 g to 3 inhalations via spacer device 3 times per day.  When exacerbation has resolved, she may return to previous dose of 2 inhalations via spacer device twice a day.  If needed, we will step up therapy to ICS/LABA or tiotropium.

## 2015-04-01 ENCOUNTER — Other Ambulatory Visit: Payer: Self-pay | Admitting: Family Medicine

## 2015-04-01 NOTE — Telephone Encounter (Signed)
Last OV 03-12-15 Lorazepam last filled 01-28-15 #60 with 1

## 2015-04-01 NOTE — Telephone Encounter (Signed)
Medication filled to pharmacy as requested.   

## 2015-04-09 ENCOUNTER — Other Ambulatory Visit: Payer: Self-pay | Admitting: Family Medicine

## 2015-04-09 NOTE — Telephone Encounter (Signed)
Medication filled to pharmacy as requested.   

## 2015-04-24 LAB — HM MAMMOGRAPHY

## 2015-04-29 ENCOUNTER — Encounter: Payer: Self-pay | Admitting: General Practice

## 2015-05-07 ENCOUNTER — Ambulatory Visit: Payer: Medicare Other | Admitting: Family Medicine

## 2015-05-15 ENCOUNTER — Telehealth: Payer: Self-pay | Admitting: Allergy and Immunology

## 2015-05-15 NOTE — Telephone Encounter (Signed)
Pt called and made appointment and needs some samples of pro air HFA, and Qvar '80mg'$ , Qnasl 80. Call her at [phone]

## 2015-05-15 NOTE — Telephone Encounter (Signed)
Left sample of Qvar 80 at the front for patient and patient notified. Also notified that we did not have any samples of Proair or Qnasl 80 at this time.

## 2015-05-19 ENCOUNTER — Other Ambulatory Visit: Payer: Self-pay | Admitting: Pediatrics

## 2015-05-22 ENCOUNTER — Encounter: Payer: Self-pay | Admitting: Family Medicine

## 2015-05-22 ENCOUNTER — Ambulatory Visit (INDEPENDENT_AMBULATORY_CARE_PROVIDER_SITE_OTHER): Payer: Medicare Other | Admitting: Family Medicine

## 2015-05-22 VITALS — BP 130/71 | HR 72 | Temp 97.7°F | Ht 62.0 in | Wt 119.4 lb

## 2015-05-22 DIAGNOSIS — I10 Essential (primary) hypertension: Secondary | ICD-10-CM | POA: Diagnosis not present

## 2015-05-22 LAB — BASIC METABOLIC PANEL
BUN: 16 mg/dL (ref 6–23)
CHLORIDE: 105 meq/L (ref 96–112)
CO2: 29 meq/L (ref 19–32)
CREATININE: 0.64 mg/dL (ref 0.40–1.20)
Calcium: 10.1 mg/dL (ref 8.4–10.5)
GFR: 116.6 mL/min (ref >60.00)
Glucose, Bld: 104 mg/dL — ABNORMAL HIGH (ref 70–99)
POTASSIUM: 3.6 meq/L (ref 3.5–5.1)
Sodium: 140 mEq/L (ref 135–145)

## 2015-05-22 LAB — CBC WITH DIFFERENTIAL/PLATELET
BASOS PCT: 0.6 % (ref 0.0–3.0)
Basophils Absolute: 0 10*3/uL (ref 0.0–0.1)
EOS ABS: 0.1 10*3/uL (ref 0.0–0.7)
EOS PCT: 2.8 % (ref 0.0–5.0)
HCT: 37 % (ref 36.0–46.0)
HEMOGLOBIN: 12 g/dL (ref 12.0–15.0)
Lymphocytes Relative: 31.5 % (ref 12.0–46.0)
Lymphs Abs: 1.7 10*3/uL (ref 0.7–4.0)
MCHC: 32.3 g/dL (ref 30.0–36.0)
MCV: 94.3 fl (ref 78.0–100.0)
Monocytes Absolute: 0.6 10*3/uL (ref 0.1–1.0)
Monocytes Relative: 11.9 % (ref 3.0–12.0)
NEUTROS ABS: 2.8 10*3/uL (ref 1.4–7.7)
Neutrophils Relative %: 53.2 % (ref 43.0–77.0)
PLATELETS: 268 10*3/uL (ref 150.0–400.0)
RBC: 3.93 Mil/uL (ref 3.87–5.11)
RDW: 13.2 % (ref 11.5–15.5)
WBC: 5.3 10*3/uL (ref 4.0–10.5)

## 2015-05-22 NOTE — Assessment & Plan Note (Signed)
Chronic problem.  Well controlled.  Asymptomatic.  Check labs.  No anticipated med changes. 

## 2015-05-22 NOTE — Progress Notes (Signed)
   Subjective:    Patient ID: Amanda Jackson, female    DOB: [birth year], 75 y.o.   MRN: [id number]  HPI HTN- chronic problem, on Norvasc, Coreg, losartan- HCTZ daily.  No CP, SOB above baseline (known asthma), HAs other than sinus, visual changes, edema.   Review of Systems For ROS see HPI     Objective:   Physical Exam  Constitutional: She is oriented to person, place, and time. She appears well-developed and well-nourished. No distress.  HENT:  Head: Normocephalic and atraumatic.  Eyes: Conjunctivae and EOM are normal. Pupils are equal, round, and reactive to light.  Neck: Normal range of motion. Neck supple. No thyromegaly present.  Cardiovascular: Normal rate, regular rhythm, normal heart sounds and intact distal pulses.   No murmur heard. Pulmonary/Chest: Effort normal and breath sounds normal. No respiratory distress.  Abdominal: Soft. She exhibits no distension. There is no tenderness.  Musculoskeletal: She exhibits no edema.  Lymphadenopathy:    She has no cervical adenopathy.  Neurological: She is alert and oriented to person, place, and time.  Skin: Skin is warm and dry.  Psychiatric: She has a normal mood and affect. Her behavior is normal.  Vitals reviewed.         Assessment & Plan:

## 2015-05-22 NOTE — Progress Notes (Signed)
Pre visit review using our clinic review tool, if applicable. No additional management support is needed unless otherwise documented below in the visit note. 

## 2015-05-22 NOTE — Patient Instructions (Signed)
Schedule your complete physical for July We'll notify you of your lab results and make any changes if needed Keep up the good work!  You look great! Call with any questions or concerns If you want to join [place] at the new [place] office, any scheduled appointments will automatically transfer and we will see you at [address] [place] [address] [place], [place] [address] ([place]!) Happy New Year!!!

## 2015-05-23 ENCOUNTER — Encounter: Payer: Self-pay | Admitting: General Practice

## 2015-05-29 ENCOUNTER — Other Ambulatory Visit: Payer: Self-pay | Admitting: Family Medicine

## 2015-05-29 NOTE — Telephone Encounter (Signed)
Last oV 05/22/15 Lorazepam last filled 04/01/15 #60 with 1

## 2015-05-30 NOTE — Telephone Encounter (Signed)
Medication filled to pharmacy as requested.   

## 2015-06-02 ENCOUNTER — Encounter: Payer: Self-pay | Admitting: Allergy and Immunology

## 2015-06-02 ENCOUNTER — Ambulatory Visit (INDEPENDENT_AMBULATORY_CARE_PROVIDER_SITE_OTHER): Payer: Medicare Other | Admitting: Allergy and Immunology

## 2015-06-02 VITALS — BP 106/68 | HR 70 | Temp 97.8°F | Resp 16

## 2015-06-02 DIAGNOSIS — J339 Nasal polyp, unspecified: Secondary | ICD-10-CM | POA: Diagnosis not present

## 2015-06-02 DIAGNOSIS — J3089 Other allergic rhinitis: Secondary | ICD-10-CM

## 2015-06-02 DIAGNOSIS — J0110 Acute frontal sinusitis, unspecified: Secondary | ICD-10-CM

## 2015-06-02 DIAGNOSIS — J45901 Unspecified asthma with (acute) exacerbation: Secondary | ICD-10-CM | POA: Diagnosis not present

## 2015-06-02 MED ORDER — IPRATROPIUM BROMIDE 0.02 % IN SOLN
0.5000 mg | Freq: Once | RESPIRATORY_TRACT | Status: AC
  Administered 2015-06-02: 0.5 mg via RESPIRATORY_TRACT

## 2015-06-02 MED ORDER — LEVALBUTEROL HCL 1.25 MG/3ML IN NEBU
1.2500 mg | INHALATION_SOLUTION | Freq: Once | RESPIRATORY_TRACT | Status: AC
  Administered 2015-06-02: 1.25 mg via RESPIRATORY_TRACT

## 2015-06-02 MED ORDER — PREDNISONE 1 MG PO TABS: 10.0000 mg | ORAL_TABLET | ORAL | Status: DC

## 2015-06-02 NOTE — Assessment & Plan Note (Addendum)
   Prednisone has been provided, 10 mg daily 5 days.  For now, increase Qvar 80 g to 3 inhalations via spacer device 3 times a day.  Resume previous dose of 2 inhalations twice a day when symptoms have returned to baseline.   Amanda Jackson has been asked to contact me if her symptoms persist or progress. Otherwise, she may return for follow up in 4 months.

## 2015-06-02 NOTE — Assessment & Plan Note (Signed)
   Continue daily use of Qnasl 80 g per nostril 1-2 times daily.

## 2015-06-02 NOTE — Progress Notes (Signed)
Follow-up Note  RE: Amanda Jackson MRN: [id number] DOB: 1941-04-06 Date of Office Visit: 06/02/2015  Primary care provider: Annye Asa, MD Referring provider: Midge Minium, MD  History of present illness: HPI Comments: Amanda Jackson is a 75 y.o. female with persistent asthma, allergic rhinitis, and history of nasal polyps, who presents today for sick visit.  She complains of coughing, dyspnea, and chest congestion with "an incredible amount of mucus" in her chest.  She also complains of nasal congestion, postnasal drainage, and sinus pressure over her forehead and between her eyes.  She denies fevers, chills, or discolored mucus production.  She believes that her upper and lower respiratory symptoms have been exacerbated by the rapid weather changes recently.   Assessment and plan: Asthma with acute exacerbation  Prednisone has been provided, 10 mg daily 5 days.  For now, increase Qvar 80 g to 3 inhalations via spacer device 3 times a day.  Resume previous dose of 2 inhalations twice a day when symptoms have returned to baseline.   [name] has been asked to contact me if her symptoms persist or progress. Otherwise, she may return for follow up in 4 months.  Acute sinusitis  Prednisone has been provided (as above).  Guaifenesin [phone] mg twice daily as needed with adequate hydration as discussed.   Continue nasal saline lavage and Qnasl.   [name] is to contact me if her symptoms progress or she becomes febrile.  History of nasal polyps  Continue daily use of Qnasl 80 g per nostril 1-2 times daily.    Meds ordered this encounter  Medications  . levalbuterol (XOPENEX) nebulizer solution 1.25 mg    Sig:   . ipratropium (ATROVENT) nebulizer solution 0.5 mg    Sig:   . predniSONE (DELTASONE) tablet 10 mg    Sig:     Diagnositics: Spirometry reveals FVC of 1.64 L and an FEV1 of 1.30 L (75% predicted) without significant post bronchodilator improvement.    Physical examination: Blood pressure 106/68, pulse 70, temperature 97.8 F (36.6 C), resp. rate 16.  General: Alert, interactive, in no acute distress. HEENT: TMs pearly gray, turbinates mildly edematous with clear discharge, post-pharynx moderately erythematous. Neck: Supple without lymphadenopathy. Lungs: Clear to auscultation without wheezing, rhonchi or rales. CV: Normal S1, S2 without murmurs. Skin: Warm and dry, without lesions or rashes.  The following portions of the patient's history were reviewed and updated as appropriate: allergies, current medications, past family history, past medical history, past social history, past surgical history and problem list.    Medication List       This list is accurate as of: 06/02/15  6:26 PM.  Always use your most recent med list.               amLODipine 10 MG tablet  Commonly known as:  NORVASC  TAKE 1 TABLET (10 MG TOTAL) BY MOUTH 2 (TWO) TIMES DAILY.     aspirin 81 MG EC tablet  Take 81 mg by mouth daily.     Beclomethasone Dipropionate 80 MCG/ACT Aers  Place 1 Squirt into the nose 2 (two) times daily. Spray 1 spray in each nostril twice daily for nasal polyps     CALCIUM 600+D3 PO  Take 600 mg by mouth 2 (two) times daily.     carvedilol 12.5 MG tablet  Commonly known as:  COREG  TAKE ONE TABLET BY MOUTH TWICE DAILY WITH A MEAL     CENTRUM tablet  Take 1 tablet  by mouth daily.     conjugated estrogens vaginal cream  Commonly known as:  PREMARIN  Place 0.5 g vaginally 2 (two) times a week. As needed for spotting     CORICIDIN HBP COUGH/COLD PO  Take by mouth.     folic acid-pyridoxine-cyancobalamin 2.5-25-2 MG Tabs tablet  Commonly known as:  FOLBIC  Take 1 tablet by mouth daily.     ibuprofen 800 MG tablet  Commonly known as:  ADVIL,MOTRIN  TAKE 1 TABLET (800 MG TOTAL) BY MOUTH 3 (THREE) TIMES DAILY AS NEEDED FOR PAIN     KLOR-CON M20 20 MEQ tablet  Generic drug:  potassium chloride SA  TAKE 2 TABLETS BY  MOUTH 3 TIMES A DAY     LORazepam 1 MG tablet  Commonly known as:  ATIVAN  TAKE 1 TABLET TWICE A DAY     losartan-hydrochlorothiazide 100-25 MG tablet  Commonly known as:  HYZAAR  TAKE 1 TABLET BY MOUTH DAILY.     montelukast 10 MG tablet  Commonly known as:  SINGULAIR  Take 1 tablet (10 mg total) by mouth daily.     nitroGLYCERIN 0.4 MG SL tablet  Commonly known as:  NITROSTAT  AS DIRECTED     omeprazole 20 MG capsule  Commonly known as:  PRILOSEC  Take 1 capsule (20 mg total) by mouth 2 (two) times daily.     PROAIR HFA 108 (90 Base) MCG/ACT inhaler  Generic drug:  albuterol  TWO PUFFS EVERY 4 HOURS AS NEEDED FOR COUGH OR WHEEZE.     QC NASAL RELIEF MOISTURIZING 0.05 % nasal spray  Generic drug:  oxymetazoline  Place 1 spray into both nostrils 2 (two) times daily.     QVAR 80 MCG/ACT inhaler  Generic drug:  beclomethasone     traMADol 50 MG tablet  Commonly known as:  ULTRAM  Take 50 mg by mouth every 6 (six) hours as needed. for pain     TYLENOL ARTHRITIS PAIN 650 MG CR tablet  Generic drug:  acetaminophen  Take 650 mg by mouth every 8 (eight) hours as needed for pain.        No Known Allergies  Review of systems: Constitutional: Negative for fever, chills and weight loss.  HENT: Negative for nosebleeds.   Positive for nasal congestion, postnasal drainage, and sinus pressure. Eyes: Negative for blurred vision.  Respiratory: Negative for hemoptysis.   Positive for coughing and wheezing. Cardiovascular: Negative for chest pain.  Gastrointestinal: Negative for diarrhea and constipation.  Genitourinary: Negative for dysuria.  Musculoskeletal: Negative for myalgias and joint pain.  Neurological: Negative for dizziness.  Endo/Heme/Allergies: Does not bruise/bleed easily.   Past Medical History  Diagnosis Date  . Hypertension   . Atypical chest pain     Normal Myoview 2008  . Seasonal allergies   . Hx of colonoscopy 03/18/09  . Anxiety   . MVP (mitral  valve prolapse)   . Asthma   . Tubulovillous adenoma   . Arthritis   . Colon polyp   . Allergy   . GERD (gastroesophageal reflux disease)   . Hyperlipidemia   . Left nasal polyps     Family History  Problem Relation Age of Onset  . Heart murmur Son   . Hypertension Son   . Lung cancer Father   . Cancer Father   . Hypertension Father   . Liver cancer Mother   . Cancer Mother   . Hypertension Mother   . Colon cancer Mother   .  Colon cancer Sister   . Stomach cancer Neg Hx   . Ulcerative colitis Neg Hx     Social History   Social History  . Marital Status: Divorced    Spouse Name: N/A  . Number of Children: N/A  . Years of Education: N/A   Occupational History  . 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 Smoking status: Former Smoker    Quit date: 09/09/1977  . Smokeless tobacco: Never Used     Comment: used to smoke socially. Quit in 1979.  Marland Kitchen Alcohol Use: Yes     Comment: usually has a glass of wine with dinner every evening  . Drug Use: No  . Sexual Activity: Not Currently    Birth Control/ Protection: Other-see comments     Comment: HYST   Other Topics Concern  . Not on file   Social History Narrative    I appreciate the opportunity to take part in this Jaylin's care. Please do not hesitate to contact me with questions.  Sincerely,   R. Edgar Frisk, MD

## 2015-06-02 NOTE — Patient Instructions (Addendum)
Asthma with acute exacerbation  Prednisone has been provided, 10 mg daily 5 days.  For now, increase Qvar 80 g to 3 inhalations via spacer device 3 times a day.  Resume previous dose when symptoms have returned to baseline.  The patient has been asked to contact me if her symptoms persist or progress. Otherwise, she may return for follow up in 4 months.  Acute sinusitis  Prednisone has been provided (as above).  Guaifenesin [phone] mg twice daily as needed with adequate hydration as discussed.   Continue nasal saline lavage and Qnasl.   Deneene is to contact me if her symptoms progress or she becomes febrile.  History of nasal polyps  Continue daily use of Qnasl 80 g per nostril 1-2 times daily.    Return in about 4 months (around 09/30/2015), or if symptoms worsen or fail to improve.

## 2015-06-02 NOTE — Assessment & Plan Note (Addendum)
   Prednisone has been provided (as above).  Guaifenesin [phone] mg twice daily as needed with adequate hydration as discussed.   Continue nasal saline lavage and Qnasl.   Shuntay is to contact me if her symptoms progress or she becomes febrile.

## 2015-06-21 ENCOUNTER — Other Ambulatory Visit: Payer: Self-pay | Admitting: Family Medicine

## 2015-06-23 NOTE — Telephone Encounter (Signed)
Medication filled to pharmacy as requested.   

## 2015-07-03 ENCOUNTER — Other Ambulatory Visit: Payer: Self-pay | Admitting: Family Medicine

## 2015-07-03 NOTE — Telephone Encounter (Signed)
Medication filled to pharmacy as requested.   

## 2015-07-13 ENCOUNTER — Other Ambulatory Visit: Payer: Self-pay | Admitting: Pediatrics

## 2015-07-14 ENCOUNTER — Other Ambulatory Visit: Payer: Self-pay | Admitting: Family Medicine

## 2015-07-14 NOTE — Telephone Encounter (Signed)
Medication filled to pharmacy as requested.   

## 2015-07-23 ENCOUNTER — Encounter: Payer: Self-pay | Admitting: Allergy and Immunology

## 2015-07-23 ENCOUNTER — Ambulatory Visit: Payer: Medicare Other | Admitting: Cardiology

## 2015-07-23 ENCOUNTER — Ambulatory Visit (INDEPENDENT_AMBULATORY_CARE_PROVIDER_SITE_OTHER): Payer: Medicare Other | Admitting: Allergy and Immunology

## 2015-07-23 VITALS — BP 110/72 | HR 72 | Temp 97.8°F | Resp 16

## 2015-07-23 DIAGNOSIS — J45901 Unspecified asthma with (acute) exacerbation: Secondary | ICD-10-CM

## 2015-07-23 DIAGNOSIS — J3089 Other allergic rhinitis: Secondary | ICD-10-CM

## 2015-07-23 DIAGNOSIS — J339 Nasal polyp, unspecified: Secondary | ICD-10-CM

## 2015-07-23 MED ORDER — PREDNISONE 1 MG PO TABS: 10.0000 mg | ORAL_TABLET | ORAL | Status: DC

## 2015-07-23 NOTE — Assessment & Plan Note (Signed)
   Prednisone has been provided, 20 mg x 4 days, 10 mg x1 day, then stop.  For now, increase Qvar 80 g to 3 inhalations via spacer device 3 times a day.  Resume previous dose of 2 inhalations twice a day when symptoms have returned to baseline.   Amanda Jackson has been asked to contact me if her symptoms persist or progress. Otherwise, she may return for follow up in 4 months.

## 2015-07-23 NOTE — Patient Instructions (Signed)
Asthma with acute exacerbation  Prednisone has been provided, 20 mg x 4 days, 10 mg x1 day, then stop.  For now, increase Qvar 80 g to 3 inhalations via spacer device 3 times a day.  Resume previous dose of 2 inhalations twice a day when symptoms have returned to baseline.   Amanda Jackson has been asked to contact me if her symptoms persist or progress. Otherwise, she may return for follow up in 4 months.  Allergic rhinitis  Continue appropriate allergen avoidance measures,  montelukast 10 mg daily, nasal saline irrigation as needed, and Qnasl 80 g twice daily.  History of nasal polyps  Continue daily use of Qnasl 80 g per nostril 1-2 times daily.  I have also recommended nasal saline irrigation prior to medicated nasal sprays.   Return in about 4 months (around 11/22/2015), or if symptoms worsen or fail to improve.

## 2015-07-23 NOTE — Assessment & Plan Note (Signed)
   Continue appropriate allergen avoidance measures,  montelukast 10 mg daily, nasal saline irrigation as needed, and Qnasl 80 g twice daily.

## 2015-07-23 NOTE — Progress Notes (Signed)
Follow-up Note  RE: Amanda Jackson MRN: [id number] DOB: 1940/08/18 Date of Office Visit: 07/23/2015  Primary care provider: Annye Asa, MD Referring provider: Midge Minium, MD  History of present illness: HPI Comments: Amanda Jackson is a 75 y.o. female with persistent asthma, allergic rhinitis, and history of nasal polyps who presents today for a sick visit.  She reports that a few weeks ago she had a respiratory tract infection which she leaves may have been influenza.  The symptoms from the viral syndrome improved with the exception of persistent "tight cough", dyspnea, and wheezing.  She also complains of some persistent nasal congestion, thick postnasal drainage, and occasional frontal sinus pressure.  She denies fevers or chills.  She has been using all medications as prescribed.   Assessment and plan: Asthma with acute exacerbation  Prednisone has been provided, 20 mg x 4 days, 10 mg x1 day, then stop.  For now, increase Qvar 80 g to 3 inhalations via spacer device 3 times a day.  Resume previous dose of 2 inhalations twice a day when symptoms have returned to baseline.   Eh has been asked to contact me if her symptoms persist or progress. Otherwise, she may return for follow up in 4 months.  Allergic rhinitis  Continue appropriate allergen avoidance measures,  montelukast 10 mg daily, nasal saline irrigation as needed, and Qnasl 80 g twice daily.  History of nasal polyps  Continue daily use of Qnasl 80 g per nostril 1-2 times daily.  I have also recommended nasal saline irrigation prior to medicated nasal sprays.    Meds ordered this encounter  Medications  . predniSONE (DELTASONE) tablet 10 mg    Sig:     Diagnositics: Spirometry reveals an FVC of 1.77 L and an FEV1 of 1.35 L (77% predicted) with 6% post-bronchodilator improvement.  Mild restrictive pattern with partial reversibility.    Physical examination: Blood pressure 110/72, pulse 72,  temperature 97.8 F (36.6 C), temperature source Oral, resp. rate 16.  General: Alert, interactive, in no acute distress. HEENT: TMs pearly gray, turbinates moderately edematous with thick discharge, post-pharynx erythematous. Neck: Supple without lymphadenopathy. Lungs: Mildly decreased breath sounds with expiratory wheezing bilaterally. CV: Normal S1, S2 without murmurs. Skin: Warm and dry, without lesions or rashes.  The following portions of the patient's history were reviewed and updated as appropriate: allergies, current medications, past family history, past medical history, past social history, past surgical history and problem list.    Medication List       This list is accurate as of: 07/23/15  3:34 PM.  Always use your most recent med list.               amLODipine 10 MG tablet  Commonly known as:  NORVASC  TAKE 1 TABLET (10 MG TOTAL) BY MOUTH 2 (TWO) TIMES DAILY.     aspirin 81 MG EC tablet  Take 81 mg by mouth daily.     Beclomethasone Dipropionate 80 MCG/ACT Aers  Place 1 Squirt into the nose 2 (two) times daily. Spray 1 spray in each nostril twice daily for nasal polyps     CALCIUM 600+D3 PO  Take 600 mg by mouth 2 (two) times daily.     carvedilol 12.5 MG tablet  Commonly known as:  COREG  TAKE ONE TABLET BY MOUTH TWICE DAILY WITH A MEAL     CENTRUM tablet  Take 1 tablet by mouth daily.     conjugated estrogens vaginal cream  Commonly known as:  PREMARIN  Place 0.5 g vaginally 2 (two) times a week. As needed for spotting     CORICIDIN HBP COUGH/COLD PO  Take by mouth.     FOLBIC 2.5-25-2 MG Tabs tablet  Generic drug:  folic acid-pyridoxine-cyancobalamin  TAKE 1 TABLET BY MOUTH DAILY.     ibuprofen 800 MG tablet  Commonly known as:  ADVIL,MOTRIN  TAKE 1 TABLET (800 MG TOTAL) BY MOUTH 3 (THREE) TIMES DAILY AS NEEDED FOR PAIN     KLOR-CON M20 20 MEQ tablet  Generic drug:  potassium chloride SA  TAKE 2 TABLETS BY MOUTH 3 TIMES A DAY      LORazepam 1 MG tablet  Commonly known as:  ATIVAN  TAKE 1 TABLET TWICE A DAY     losartan-hydrochlorothiazide 100-25 MG tablet  Commonly known as:  HYZAAR  TAKE 1 TABLET BY MOUTH DAILY.     montelukast 10 MG tablet  Commonly known as:  SINGULAIR  Take 1 tablet (10 mg total) by mouth daily.     nitroGLYCERIN 0.4 MG SL tablet  Commonly known as:  NITROSTAT  AS DIRECTED     omeprazole 20 MG capsule  Commonly known as:  PRILOSEC  TAKE 1 CAPSULE (20 MG TOTAL) BY MOUTH 2 (TWO) TIMES DAILY.     PROAIR HFA 108 (90 Base) MCG/ACT inhaler  Generic drug:  albuterol  TWO PUFFS EVERY 4 HOURS AS NEEDED FOR COUGH OR WHEEZE.     QC NASAL RELIEF MOISTURIZING 0.05 % nasal spray  Generic drug:  oxymetazoline  Place 1 spray into both nostrils 2 (two) times daily.     QVAR 80 MCG/ACT inhaler  Generic drug:  beclomethasone     traMADol 50 MG tablet  Commonly known as:  ULTRAM  Take 50 mg by mouth every 6 (six) hours as needed. Reported on 07/23/2015     TYLENOL ARTHRITIS PAIN 650 MG CR tablet  Generic drug:  acetaminophen  Take 650 mg by mouth every 8 (eight) hours as needed for pain.       Review of systems: Constitutional: Negative for fever, chills and weight loss.  HENT: Negative for nosebleeds.   Positive for nasal congestion, postnasal drainage. Eyes: Negative for blurred vision.  Respiratory: Negative for hemoptysis.   Positive for coughing, wheezing. Cardiovascular: Negative for chest pain.  Gastrointestinal: Negative for diarrhea and constipation.  Genitourinary: Negative for dysuria.  Musculoskeletal: Negative for myalgias and joint pain.  Neurological: Negative for dizziness.  Endo/Heme/Allergies: Does not bruise/bleed easily.   Past Medical History  Diagnosis Date  . Hypertension   . Atypical chest pain     Normal Myoview 2008  . Seasonal allergies   . Hx of colonoscopy 03/18/09  . Anxiety   . MVP (mitral valve prolapse)   . Asthma   . Tubulovillous adenoma   .  Arthritis   . Colon polyp   . Allergy   . GERD (gastroesophageal reflux disease)   . Hyperlipidemia   . Left nasal polyps     Family History  Problem Relation Age of Onset  . Heart murmur Son   . Hypertension Son   . Lung cancer Father   . Cancer Father   . Hypertension Father   . Liver cancer Mother   . Cancer Mother   . Hypertension Mother   . Colon cancer Mother   . Colon cancer Sister   . Stomach cancer Neg Hx   . Ulcerative colitis Neg Hx  Social History   Social History  . Marital Status: Divorced    Spouse Name: N/A  . Number of Children: N/A  . Years of Education: N/A   Occupational History  . 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 Smoking status: Former Smoker    Quit date: 09/09/1977  . Smokeless tobacco: Never Used     Comment: used to smoke socially. Quit in 1979.  Marland Kitchen Alcohol Use: Yes     Comment: usually has a glass of wine with dinner every evening  . Drug Use: No  . Sexual Activity: Not Currently    Birth Control/ Protection: Other-see comments     Comment: HYST   Other Topics Concern  . Not on file   Social History Narrative     No Known Allergies  I appreciate the opportunity to take part in this Kyeshia's care. Please do not hesitate to contact me with questions.  Sincerely,   R. Edgar Frisk, MD

## 2015-07-23 NOTE — Assessment & Plan Note (Signed)
   Continue daily use of Qnasl 80 g per nostril 1-2 times daily.  I have also recommended nasal saline irrigation prior to medicated nasal sprays.

## 2015-07-24 ENCOUNTER — Telehealth: Payer: Self-pay | Admitting: Family Medicine

## 2015-07-24 MED ORDER — LORAZEPAM 1 MG PO TABS: 1.0000 mg | ORAL_TABLET | Freq: Two times a day (BID) | ORAL | Status: DC

## 2015-07-24 NOTE — Telephone Encounter (Signed)
Pt states that she needs refill on lorazepam

## 2015-07-24 NOTE — Telephone Encounter (Signed)
[place] for #60, 1 refill 

## 2015-07-24 NOTE — Telephone Encounter (Signed)
Medication filled to pharmacy as requested.   

## 2015-07-24 NOTE — Telephone Encounter (Signed)
Last OV 05/22/15 Lorazepam last filled 05/30/15 #60 with 1

## 2015-07-30 ENCOUNTER — Ambulatory Visit (INDEPENDENT_AMBULATORY_CARE_PROVIDER_SITE_OTHER): Payer: Medicare Other | Admitting: Family Medicine

## 2015-07-30 ENCOUNTER — Encounter: Payer: Self-pay | Admitting: Family Medicine

## 2015-07-30 ENCOUNTER — Telehealth: Payer: Self-pay | Admitting: Family Medicine

## 2015-07-30 VITALS — BP 112/72 | HR 72 | Temp 98.8°F | Resp 16 | Wt 121.0 lb

## 2015-07-30 DIAGNOSIS — R55 Syncope and collapse: Secondary | ICD-10-CM

## 2015-07-30 LAB — CBC WITH DIFFERENTIAL/PLATELET
BASOS PCT: 0.4 % (ref 0.0–3.0)
Basophils Absolute: 0 10*3/uL (ref 0.0–0.1)
EOS ABS: 0.1 10*3/uL (ref 0.0–0.7)
EOS PCT: 1.9 % (ref 0.0–5.0)
HCT: 38.1 % (ref 36.0–46.0)
HEMOGLOBIN: 12.6 g/dL (ref 12.0–15.0)
Lymphocytes Relative: 27.7 % (ref 12.0–46.0)
Lymphs Abs: 1.6 10*3/uL (ref 0.7–4.0)
MCHC: 33 g/dL (ref 30.0–36.0)
MCV: 94 fl (ref 78.0–100.0)
Monocytes Absolute: 0.6 10*3/uL (ref 0.1–1.0)
Monocytes Relative: 11.4 % (ref 3.0–12.0)
NEUTROS ABS: 3.3 10*3/uL (ref 1.4–7.7)
Neutrophils Relative %: 58.6 % (ref 43.0–77.0)
PLATELETS: 308 10*3/uL (ref 150.0–400.0)
RBC: 4.06 Mil/uL (ref 3.87–5.11)
RDW: 13.5 % (ref 11.5–15.5)
WBC: 5.6 10*3/uL (ref 4.0–10.5)

## 2015-07-30 LAB — BASIC METABOLIC PANEL
BUN: 20 mg/dL (ref 6–23)
CALCIUM: 10.5 mg/dL (ref 8.4–10.5)
CHLORIDE: 106 meq/L (ref 96–112)
CO2: 30 meq/L (ref 19–32)
Creatinine, Ser: 0.61 mg/dL (ref 0.40–1.20)
GFR: 123.17 mL/min (ref >60.00)
GLUCOSE: 98 mg/dL (ref 70–99)
Potassium: 3.7 mEq/L (ref 3.5–5.1)
Sodium: 141 mEq/L (ref 135–145)

## 2015-07-30 LAB — TSH: TSH: 0.62 u[IU]/mL (ref 0.35–4.50)

## 2015-07-30 NOTE — Telephone Encounter (Signed)
Pt informed and expressed an understanding.

## 2015-07-30 NOTE — Assessment & Plan Note (Signed)
New.  Agree w/ EMS assessment at time of initial evaluation that this is most likely a medication induced vagal event.  Pt's PE WNL.  EKG w/o arrhythmia.  Check labs to r/o metabolic abnormality.  Discussed that she should not use Nitro and Lorazepam in combo due to hypotension.  Pt expressed understanding and is in agreement w/ plan.  Pt to f/u w/ cardiology as scheduled.  Reviewed supportive care and red flags that should prompt return.

## 2015-07-30 NOTE — Telephone Encounter (Signed)
Caller name:Alonnie Relationship to patient: Can be reached:[phone] Pharmacy:  Reason for call:can she have a glass of wine or beer at dinner

## 2015-07-30 NOTE — Patient Instructions (Signed)
Follow up as needed We'll notify you of your lab results and make any changes if needed Do not take Nitro and Lorazepam at the same time- this causes your blood pressure to drop too low I suspect this was a medication issue and hopefully will not happen again Drink plenty of fluids REST! Call with any questions or concerns Happy Spring!!!

## 2015-07-30 NOTE — Progress Notes (Signed)
Pre visit review using our clinic review tool, if applicable. No additional management support is needed unless otherwise documented below in the visit note. 

## 2015-07-30 NOTE — Progress Notes (Signed)
   Subjective:    Patient ID: Amanda Jackson, female    DOB: March 13, 1941, 75 y.o.   MRN: [id number]  HPI Syncope- pt had a syncopal event 4/1.  At the time she had taken all of her usual meds, the Prednisone from [place] for chest pain, and Lorazepam.  Pt felt 'woozy' and sat down on the floor.  Significant other came to get her and noted her slump to the side and eyes roll back.  He called EMS.  Pt is not clear on how long she was out.  Pt reports feeling weak and sweaty when she came to.  Got fluids and IVF from EMS.  Pt is not aware of what her BP was at time of arrival.  At time of EKG BP was 102/60, HR 60.  Was able to stand w/ assistance.  Declined transport to ER.  Pt continues to feel fatigued.  Took a nap yesterday.  Pt admits quite a bit of stress recently- son lost his job and she is contributing financially.   Pt was talking about her stressors when the chest pain occurred prior to the event.   Review of Systems For ROS see HPI     Objective:   Physical Exam  Constitutional: She is oriented to person, place, and time. She appears well-developed and well-nourished. No distress.  HENT:  Head: Normocephalic and atraumatic.  Eyes: Conjunctivae and EOM are normal. Pupils are equal, round, and reactive to light.  Neck: Normal range of motion. Neck supple. No thyromegaly present.  Cardiovascular: Normal rate, regular rhythm, normal heart sounds and intact distal pulses.   No murmur heard. Pulmonary/Chest: Effort normal and breath sounds normal. No respiratory distress.  Abdominal: Soft. She exhibits no distension. There is no tenderness.  Musculoskeletal: She exhibits no edema.  Lymphadenopathy:    She has no cervical adenopathy.  Neurological: She is alert and oriented to person, place, and time.  Skin: Skin is warm and dry.  Psychiatric: She has a normal mood and affect. Her behavior is normal.  Vitals reviewed.         Assessment & Plan:

## 2015-07-30 NOTE — Telephone Encounter (Signed)
Yes- a glass of beer or wine would be fine.  Just do not use in combo w/ Lorazepam

## 2015-07-31 ENCOUNTER — Encounter: Payer: Self-pay | Admitting: General Practice

## 2015-08-18 ENCOUNTER — Other Ambulatory Visit: Payer: Self-pay | Admitting: Pediatrics

## 2015-08-21 ENCOUNTER — Other Ambulatory Visit: Payer: Self-pay | Admitting: Allergy and Immunology

## 2015-08-22 ENCOUNTER — Ambulatory Visit (INDEPENDENT_AMBULATORY_CARE_PROVIDER_SITE_OTHER): Payer: Medicare Other | Admitting: Cardiology

## 2015-08-22 ENCOUNTER — Encounter: Payer: Self-pay | Admitting: Cardiology

## 2015-08-22 VITALS — BP 108/62 | HR 70 | Ht 62.0 in | Wt 124.0 lb

## 2015-08-22 DIAGNOSIS — R072 Precordial pain: Secondary | ICD-10-CM

## 2015-08-22 DIAGNOSIS — E876 Hypokalemia: Secondary | ICD-10-CM

## 2015-08-22 DIAGNOSIS — I10 Essential (primary) hypertension: Secondary | ICD-10-CM | POA: Diagnosis not present

## 2015-08-22 DIAGNOSIS — I119 Hypertensive heart disease without heart failure: Secondary | ICD-10-CM | POA: Diagnosis not present

## 2015-08-22 DIAGNOSIS — R55 Syncope and collapse: Secondary | ICD-10-CM | POA: Diagnosis not present

## 2015-08-22 MED ORDER — SPIRONOLACTONE 25 MG PO TABS: 25.0000 mg | ORAL_TABLET | Freq: Every day | ORAL | Status: DC

## 2015-08-22 MED ORDER — POTASSIUM CHLORIDE CRYS ER 20 MEQ PO TBCR: 20.0000 meq | EXTENDED_RELEASE_TABLET | Freq: Two times a day (BID) | ORAL | Status: DC

## 2015-08-22 MED ORDER — LOSARTAN POTASSIUM 50 MG PO TABS: 50.0000 mg | ORAL_TABLET | Freq: Every day | ORAL | Status: DC

## 2015-08-22 NOTE — Patient Instructions (Signed)
Medication Instructions:   STOP TAKING HYZAAR NOW  START TAKING LOSARTAN 50 MG ONCE DAILY  START TAKING ALDACTONE 25 MG ONCE DAILY  WE ARE DECREASING YOUR POTASSIUM PILL (K-DUR) TO 20 mEq TWICE DAILY     Labwork:  BMET PRIOR TO YOUR 6 WEEK FOLLOW-UP APPOINTMENT WITH DR Meda Coffee    Follow-Up:  6 WEEKS WITH DR Meda Coffee OR HER VERY NEXT AVAILABLE AROUND THAT TIME.  PLEASE HAVE YOU LAB APPOINTMENT DONE PRIOR TO THIS APPOINTMENT.      If you need a refill on your cardiac medications before your next appointment, please call your pharmacy.

## 2015-08-22 NOTE — Progress Notes (Signed)
Patient ID: Amanda Jackson, female   DOB: August 14, 1940, 75 y.o.   MRN: [id number]    HPI: Amanda Jackson is a delightful 75 year old woman history of hypertension and atypical chest pain with a normal Myoview in 2008.  She also has seasonal allergies.  She presents today for routine followup.  She is doing very well.  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 She denies any shortness of breath, she experiences only occasional sharp chest pains that are relieved by walking around and taking sublingual nitroglycerin. She states that she experiences those mostly with emotional stress and when she relieves the tension the pain resolves. These episodes are rare and they are same in character as when she had her stress test that was negative for ischemia. 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 She is very compliant with her meds.   08/22/2015 - the patient is coming after one year, she is feeling great she occasionally gets chest pain with emotional distress, on one occasion approximately one month ago she became emotionally stress talking to her son about his problems, he took nitroglycerin for chest pain and Ativan for anxiety and when she stood up and walk passed out. He called EMS concluded she was hypotensive. No other episodes of syncope. She stays active, she is fully independent, she takes significant amount of potassium pill for her hypokalemia. She otherwise denies orthopnea, paroxysmal nocturnal dyspnea, she has occasional lower extremity edema, and denies any palpitations.   Lab Results  Component Value Date   CHOL 145 11/04/2014   HDL 66.50 11/04/2014   LDLCALC 67 11/04/2014   TRIG 56.0 11/04/2014   CHOLHDL 2 11/04/2014   ROS: All systems negative except as listed in HPI, PMH and Problem  List.  Past Medical History  Diagnosis Date  . Hypertension   . Atypical chest pain     Normal Myoview 2008  . Seasonal allergies   . Hx of colonoscopy 03/18/09  . Anxiety   . MVP (mitral valve prolapse)   . Asthma   . Tubulovillous adenoma   . Arthritis   . Colon polyp   . Allergy   . GERD (gastroesophageal reflux disease)   . Hyperlipidemia   . Left nasal polyps     Current Outpatient Prescriptions  Medication Sig Dispense Refill  . acetaminophen (TYLENOL ARTHRITIS PAIN) 650 MG CR tablet Take 650 mg by mouth every 8 (eight) hours as needed for pain.    Marland Kitchen amLODipine (NORVASC) 10 MG tablet TAKE 1 TABLET (10 MG TOTAL) BY MOUTH 2 (TWO) TIMES DAILY. 180 tablet 0  . aspirin 81 MG EC tablet Take 81 mg by mouth daily.      . Beclomethasone Dipropionate 80 MCG/ACT AERS Place 1 Squirt into the nose 2 (two) times daily. Spray 1 spray in each nostril twice daily for nasal polyps    . Calcium Carb-Cholecalciferol (CALCIUM 600+D3 PO) Take 600 mg by mouth 2 (two) times daily.     . carvedilol (COREG) 12.5 MG tablet TAKE ONE TABLET BY MOUTH TWICE DAILY WITH A MEAL 60 tablet 6  . Chlorpheniramine-DM (CORICIDIN HBP COUGH/COLD PO) Take by mouth.    . conjugated estrogens (PREMARIN) vaginal cream Place 0.5 g vaginally 2 (two) times a week. As needed  for spotting    . FOLBIC 2.5-25-2 MG TABS tablet TAKE 1 TABLET BY MOUTH DAILY. 30 tablet 4  . ibuprofen (ADVIL,MOTRIN) 800 MG tablet TAKE 1 TABLET (800 MG TOTAL) BY MOUTH 3 (THREE) TIMES DAILY AS NEEDED FOR PAIN 90 tablet 3  . KLOR-CON M20 20 MEQ tablet TAKE 2 TABLETS BY MOUTH 3 TIMES A DAY 180 tablet 5  . LORazepam (ATIVAN) 1 MG tablet Take 1 tablet (1 mg total) by mouth 2 (two) times daily. 60 tablet 1  . losartan-hydrochlorothiazide (HYZAAR) 100-25 MG per tablet TAKE 1 TABLET BY MOUTH DAILY. 90 tablet 2  . montelukast (SINGULAIR) 10 MG tablet Take 1 tablet (10 mg total) by mouth daily. 90 tablet 1  . Multiple Vitamins-Minerals (CENTRUM) tablet Take  1 tablet by mouth daily.      . nitroGLYCERIN (NITROSTAT) 0.4 MG SL tablet AS DIRECTED (Patient taking differently: Place 0.4 mg under the tongue. For chest pain (patient unsure of MAX doses allowed)) 25 tablet 0  . omeprazole (PRILOSEC) 20 MG capsule TAKE 1 CAPSULE (20 MG TOTAL) BY MOUTH 2 (TWO) TIMES DAILY. 180 capsule 1  . PROAIR HFA 108 (90 Base) MCG/ACT inhaler TWO PUFFS EVERY 4 HOURS AS NEEDED FOR COUGH OR WHEEZE. 8.5 Inhaler 0  . QVAR 80 MCG/ACT inhaler Inhale 2 puffs into the lungs.     . traMADol (ULTRAM) 50 MG tablet Take 50 mg by mouth every 6 (six) hours as needed. Reported on 07/23/2015  0   No current facility-administered medications for this visit.    PHYSICAL EXAM: Filed Vitals:   08/22/15 0751  BP: 108/62  Pulse: 70   General:  Well appearing. No resp difficulty HEENT: normal Neck: supple. JVP flat. Carotids 2+ bilaterally; no bruits. No lymphadenopathy or thryomegaly appreciated. Cor: PMI normal. Regular rate & rhythm. No rubs, gallops or murmurs. Lungs: clear Abdomen: soft, nontender, nondistended. No hepatosplenomegaly. No bruits or masses. Good bowel sounds. Extremities: no cyanosis, clubbing, rash, edema. Left knee swelling  Neuro: alert & orientedx3, cranial nerves grossly intact. Moves all 4 extremities w/o difficulty. Affect pleasant.  ECG: Performed today 08/22/2015, shows normal sinus rhythm completely normal EKG is unchanged from prior.  ASSESSMENT & PLAN:  A very pleasant 75 year old female  1.   Hypertensive heart disease without heart failure - the blood pressure too low for her age with an episode of syncope after taking 1 pill of nitroglycerin, considering she also has hypokalemia I will change her medication as follows  - Discontinue Hyzaar, combination of losartan hydrochlorothiazide 100/25 - Start losartan 50 mg daily. - Start Aldactone 25 mg daily.  2. Atypical chest pain and recent history of syncope - negative ischemia workup in 2008,  unchanged symptoms patient very functional. Her EKG is completely normal and unchanged from last year. No ischemic workup indicated at this point.  3. Hypokalemia - Start Aldactone 25 mg daily - Decrease potassium chloride to 20 mEq twice a day from previous 3 times a day  4. Lipids - completely normal and gender 2016 field check every other year.   Follow up in 6 weeks.  [name] 08/22/2015

## 2015-08-24 ENCOUNTER — Other Ambulatory Visit: Payer: Self-pay | Admitting: Cardiology

## 2015-08-26 ENCOUNTER — Telehealth: Payer: Self-pay | Admitting: Family Medicine

## 2015-08-26 NOTE — Telephone Encounter (Signed)
This is all ok by me.  Dr Meda Coffee is great!

## 2015-08-26 NOTE — Telephone Encounter (Signed)
Pt notified of PCP recommendations and expressed an understanding

## 2015-08-26 NOTE — Telephone Encounter (Signed)
Pt states that she went to see Dr Mickel Baas. and she has made some changes with pt meds that has made her nervous. Pt states that Dr has changed her potassium to 1/2 and started on spironolactone and has also changed her losartan to '50mg'$ . Pt would like a call back letting her know this is ok

## 2015-08-30 ENCOUNTER — Other Ambulatory Visit: Payer: Self-pay | Admitting: Pediatrics

## 2015-09-09 ENCOUNTER — Other Ambulatory Visit: Payer: Self-pay | Admitting: Allergy and Immunology

## 2015-09-21 ENCOUNTER — Other Ambulatory Visit: Payer: Self-pay | Admitting: Allergy and Immunology

## 2015-09-23 ENCOUNTER — Telehealth: Payer: Self-pay | Admitting: *Deleted

## 2015-09-23 NOTE — Telephone Encounter (Signed)
Made in error

## 2015-09-23 NOTE — Telephone Encounter (Signed)
Amanda Jackson would like an itemized bill mailed to her

## 2015-09-24 NOTE — Telephone Encounter (Signed)
Mail printout

## 2015-09-29 ENCOUNTER — Other Ambulatory Visit: Payer: Self-pay | Admitting: General Practice

## 2015-09-29 MED ORDER — LORAZEPAM 1 MG PO TABS: 1.0000 mg | ORAL_TABLET | Freq: Two times a day (BID) | ORAL | Status: DC

## 2015-09-29 NOTE — Telephone Encounter (Signed)
Medication filled to pharmacy as requested.   

## 2015-09-29 NOTE — Telephone Encounter (Signed)
Last OV 07/30/15 Lorazepam last filled 07/24/15 #60 with 1

## 2015-10-01 ENCOUNTER — Ambulatory Visit: Payer: Medicare Other | Admitting: Allergy and Immunology

## 2015-10-07 ENCOUNTER — Ambulatory Visit: Payer: Medicare Other | Admitting: Allergy and Immunology

## 2015-10-10 ENCOUNTER — Other Ambulatory Visit (INDEPENDENT_AMBULATORY_CARE_PROVIDER_SITE_OTHER): Payer: Medicare Other | Admitting: *Deleted

## 2015-10-10 DIAGNOSIS — E876 Hypokalemia: Secondary | ICD-10-CM

## 2015-10-10 DIAGNOSIS — I10 Essential (primary) hypertension: Secondary | ICD-10-CM

## 2015-10-10 LAB — BASIC METABOLIC PANEL
BUN: 14 mg/dL (ref 7–25)
CO2: 25 mmol/L (ref 20–31)
Calcium: 9.6 mg/dL (ref 8.6–10.4)
Chloride: 103 mmol/L (ref 98–110)
Creat: 0.71 mg/dL (ref 0.60–0.93)
Glucose, Bld: 115 mg/dL — ABNORMAL HIGH (ref 65–99)
Potassium: 4.1 mmol/L (ref 3.5–5.3)
Sodium: 135 mmol/L (ref 135–146)

## 2015-10-10 NOTE — Addendum Note (Signed)
Addended by: Eulis Foster on: 10/10/2015 08:44 AM   Modules accepted: Orders

## 2015-10-13 ENCOUNTER — Telehealth: Payer: Self-pay | Admitting: Cardiology

## 2015-10-13 NOTE — Telephone Encounter (Signed)
Fu  Pt returning RN phone call- lab results. Please call back and discuss.   

## 2015-10-13 NOTE — Telephone Encounter (Signed)
PT AWARE OF LAB RESULTS./CY 

## 2015-10-15 ENCOUNTER — Ambulatory Visit (INDEPENDENT_AMBULATORY_CARE_PROVIDER_SITE_OTHER): Payer: Medicare Other | Admitting: Cardiology

## 2015-10-15 ENCOUNTER — Other Ambulatory Visit: Payer: Self-pay | Admitting: *Deleted

## 2015-10-15 ENCOUNTER — Encounter: Payer: Self-pay | Admitting: Cardiology

## 2015-10-15 VITALS — BP 120/60 | HR 64 | Ht 62.0 in | Wt 122.0 lb

## 2015-10-15 DIAGNOSIS — I119 Hypertensive heart disease without heart failure: Secondary | ICD-10-CM

## 2015-10-15 DIAGNOSIS — R0789 Other chest pain: Secondary | ICD-10-CM

## 2015-10-15 DIAGNOSIS — R55 Syncope and collapse: Secondary | ICD-10-CM

## 2015-10-15 DIAGNOSIS — E876 Hypokalemia: Secondary | ICD-10-CM | POA: Diagnosis not present

## 2015-10-15 MED ORDER — POTASSIUM CHLORIDE CRYS ER 20 MEQ PO TBCR: 20.0000 meq | EXTENDED_RELEASE_TABLET | Freq: Every day | ORAL | Status: DC

## 2015-10-15 NOTE — Addendum Note (Signed)
Addended by: Nuala Alpha on: 10/15/2015 10:14 AM   Modules accepted: Orders, Medications

## 2015-10-15 NOTE — Patient Instructions (Addendum)
Your physician has recommended you make the following change in your medication: DECREASE POTASSIUM 20MEQ TO ONCE DAILY.    Your physician recommends that you return for lab work: NONE TODAY     Your physician wants you to follow-up in: Dane will receive a reminder letter in the mail two months in advance. If you don't receive a letter, please call our office to schedule the follow-up appointment.

## 2015-10-15 NOTE — Progress Notes (Signed)
Patient ID: Amanda Jackson, female   DOB: [birth year], 75 y.o.   MRN: [id number]    HPI: Amanda Jackson is a delightful 75 year old woman history of hypertension and atypical chest pain with a normal Myoview in 2008.  She also has seasonal allergies.  She presents today for routine followup.  She is doing very well.  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 She denies any shortness of breath, she experiences only occasional sharp chest pains that are relieved by walking around and taking sublingual nitroglycerin. She states that she experiences those mostly with emotional stress and when she relieves the tension the pain resolves. These episodes are rare and they are same in character as when she had her stress test that was negative for ischemia. 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 She is very compliant with her meds.   08/22/2015 - the patient is coming after one year, she is feeling great she occasionally gets chest pain with emotional distress, on one occasion approximately one month ago she became emotionally stress talking to her son about his problems, he took nitroglycerin for chest pain and Ativan for anxiety and when she stood up and walk passed out. He called EMS concluded she was hypotensive. No other episodes of syncope. She stays active, she is fully independent, she takes significant amount of potassium pill for her hypokalemia. She otherwise denies orthopnea, paroxysmal nocturnal dyspnea, she has occasional lower extremity edema, and denies any palpitations.   10/15/2015 - patient is coming after 6 weeks, at the last visit we change her blood pressure medication and added spironolactone as she was on hypokalemic, her repeat labs showed normal potassium and normal creatinine. She feels  well, she denies any shortness of breath or chest pain no lower extremity edema orthopnea or proximal nocturnal dyspnea. She is complaining of easy bruising.  Lab Results  Component Value Date   CHOL 145 11/04/2014   HDL 66.50 11/04/2014   LDLCALC 67 11/04/2014   TRIG 56.0 11/04/2014   CHOLHDL 2 11/04/2014   ROS: All systems negative except as listed in HPI, PMH and Problem List.  Past Medical History  Diagnosis Date  . Hypertension   . Atypical chest pain     Normal Myoview 2008  . Seasonal allergies   . Hx of colonoscopy 03/18/09  . Anxiety   . MVP (mitral valve prolapse)   . Asthma   . Tubulovillous adenoma   . Arthritis   . Colon polyp   . Allergy   . GERD (gastroesophageal reflux disease)   . Hyperlipidemia   . Left nasal polyps     Current Outpatient Prescriptions  Medication Sig Dispense Refill  . acetaminophen (TYLENOL ARTHRITIS PAIN) 650 MG CR tablet Take 650 mg by mouth every 8 (eight) hours as needed for pain.    Marland Kitchen amLODipine (NORVASC) 10 MG tablet Take 10 mg by mouth 2 (two) times daily.  3  . aspirin 81 MG EC tablet Take 81 mg by mouth daily.      . Calcium Carb-Cholecalciferol (CALCIUM 600+D3 PO) Take 600 mg by mouth 2 (two) times daily.     . carvedilol (COREG) 12.5 MG tablet TAKE ONE TABLET BY MOUTH TWICE DAILY WITH A MEAL 60 tablet 6  .  Chlorpheniramine-DM (CORICIDIN HBP COUGH/COLD PO) Take by mouth. Reported on 10/15/2015    . conjugated estrogens (PREMARIN) vaginal cream Place 0.5 g vaginally 2 (two) times a week. As needed for spotting    . FOLBIC 2.5-25-2 MG TABS tablet TAKE 1 TABLET BY MOUTH DAILY. 30 tablet 4  . LORazepam (ATIVAN) 1 MG tablet Take 1 tablet (1 mg total) by mouth 2 (two) times daily. 60 tablet 1  . losartan (COZAAR) 50 MG tablet Take 1 tablet (50 mg total) by mouth daily. 90 tablet 3  . montelukast (SINGULAIR) 10 MG tablet TAKE 1 TABLET (10 MG TOTAL) BY MOUTH DAILY. 90 tablet 0  . Multiple Vitamins-Minerals (CENTRUM) tablet Take 1  tablet by mouth daily.      . nitroGLYCERIN (NITROSTAT) 0.4 MG SL tablet AS DIRECTED (Patient taking differently: Place 0.4 mg under the tongue. For chest pain (patient unsure of MAX doses allowed)) 25 tablet 0  . omeprazole (PRILOSEC) 20 MG capsule TAKE 1 CAPSULE (20 MG TOTAL) BY MOUTH 2 (TWO) TIMES DAILY. 180 capsule 1  . potassium chloride SA (K-DUR,KLOR-CON) 20 MEQ tablet Take 1 tablet (20 mEq total) by mouth 2 (two) times daily. 180 tablet 3  . PROAIR HFA 108 (90 Base) MCG/ACT inhaler TWO PUFFS EVERY 4 HOURS AS NEEDED FOR COUGH OR WHEEZE. 1 Inhaler 0  . QNASL 80 MCG/ACT AERS ONE SPRAY EACH NOSTRIL TWICE DAILY FOR STUFFY NOSE OR DRAINAGE. 8.7 Inhaler 5  . QVAR 80 MCG/ACT inhaler TAKE TWO PUFFS TWICE DAILY TO PREVENT COUGH OR WHEEZE. RINSE,GARGLE AND SPIT. USE WITH SPACER. 8.7 g 3  . spironolactone (ALDACTONE) 25 MG tablet Take 1 tablet (25 mg total) by mouth daily. 90 tablet 3  . traMADol (ULTRAM) 50 MG tablet Take 50 mg by mouth every 6 (six) hours as needed. Reported on 07/23/2015  0  . ibuprofen (ADVIL,MOTRIN) 800 MG tablet TAKE 1 TABLET (800 MG TOTAL) BY MOUTH 3 (THREE) TIMES DAILY AS NEEDED FOR PAIN (Patient not taking: Reported on 10/15/2015) 90 tablet 3   No current facility-administered medications for this visit.    PHYSICAL EXAM: Filed Vitals:   10/15/15 0932  BP: 120/60  Pulse: 64   General:  Well appearing. No resp difficulty HEENT: normal Neck: supple. JVP flat. Carotids 2+ bilaterally; no bruits. No lymphadenopathy or thryomegaly appreciated. Cor: PMI normal. Regular rate & rhythm. No rubs, gallops or murmurs. Lungs: clear Abdomen: soft, nontender, nondistended. No hepatosplenomegaly. No bruits or masses. Good bowel sounds. Extremities: no cyanosis, clubbing, rash, edema. Left knee swelling  Neuro: alert & orientedx3, cranial nerves grossly intact. Moves all 4 extremities w/o difficulty. Affect pleasant.  ECG: Performed today 08/22/2015, shows normal sinus rhythm  completely normal EKG is unchanged from prior.   ASSESSMENT & PLAN:  A very pleasant 75 year old female  1.   Hypertensive heart disease without heart failure - the blood pressure too low for her age with an episode of syncope after taking 1 pill of nitroglycerin, She is feeling significantly better adjusting her blood pressure medicine now blood pressure 120 range.   2. Atypical chest pain and recent history of syncope - negative ischemia workup in 2008, unchanged symptoms patient very functional. Her EKG is completely normal and unchanged from last year. No ischemic workup indicated at this point.  3. Hypokalemia  - Resolved after starting Aldactone 25 mg daily - Decrease potassium chloride to 20 mEq to once daily  4. Lipids - completely normal and gender 2016 field check every other year.  Follow up in 6 months.  Ena Dawley 10/15/2015

## 2015-10-27 ENCOUNTER — Telehealth: Payer: Self-pay | Admitting: Family Medicine

## 2015-10-27 NOTE — Telephone Encounter (Signed)
Duplicated by mistake.

## 2015-10-27 NOTE — Telephone Encounter (Signed)
Pt states that she needs a refill on lorazepam, [facility] on [address]

## 2015-10-30 ENCOUNTER — Other Ambulatory Visit: Payer: Self-pay | Admitting: Family Medicine

## 2015-10-30 NOTE — Telephone Encounter (Signed)
Med denied, pharmacy forgot to place a refill on this.

## 2015-11-03 ENCOUNTER — Encounter: Payer: Medicare Other | Admitting: Family Medicine

## 2015-11-07 ENCOUNTER — Other Ambulatory Visit: Payer: Self-pay | Admitting: Allergy and Immunology

## 2015-11-18 ENCOUNTER — Ambulatory Visit: Payer: Medicare Other | Admitting: Allergy and Immunology

## 2015-11-19 ENCOUNTER — Ambulatory Visit: Payer: Medicare Other | Admitting: Allergy and Immunology

## 2015-11-24 ENCOUNTER — Encounter: Payer: Self-pay | Admitting: Allergy and Immunology

## 2015-11-24 ENCOUNTER — Ambulatory Visit (INDEPENDENT_AMBULATORY_CARE_PROVIDER_SITE_OTHER): Payer: Medicare Other | Admitting: Allergy and Immunology

## 2015-11-24 VITALS — BP 108/62 | HR 68 | Temp 97.8°F

## 2015-11-24 DIAGNOSIS — R05 Cough: Secondary | ICD-10-CM

## 2015-11-24 DIAGNOSIS — K219 Gastro-esophageal reflux disease without esophagitis: Secondary | ICD-10-CM

## 2015-11-24 DIAGNOSIS — J3089 Other allergic rhinitis: Secondary | ICD-10-CM | POA: Diagnosis not present

## 2015-11-24 DIAGNOSIS — J4540 Moderate persistent asthma, uncomplicated: Secondary | ICD-10-CM | POA: Diagnosis not present

## 2015-11-24 DIAGNOSIS — J339 Nasal polyp, unspecified: Secondary | ICD-10-CM | POA: Diagnosis not present

## 2015-11-24 DIAGNOSIS — R053 Chronic cough: Secondary | ICD-10-CM

## 2015-11-24 NOTE — Assessment & Plan Note (Signed)
   Continue appropriate reflux lifestyle modifications and omeprazole as previously prescribed.

## 2015-11-24 NOTE — Assessment & Plan Note (Signed)
Most likely multifactorial with contribution from postnasal drainage, bronchial hyperresponsiveness, and possibly acid reflux.  Treatment plan as outlined above.

## 2015-11-24 NOTE — Assessment & Plan Note (Signed)
   Continue daily use of Qnasl 80 g per nostril 1-2 times daily and nasal saline irrigation as needed.

## 2015-11-24 NOTE — Assessment & Plan Note (Signed)
   Continue appropriate allergen avoidance measures,  montelukast 10 mg daily, nasal saline irrigation as needed, and Qnasl 80 g twice daily.

## 2015-11-24 NOTE — Progress Notes (Signed)
Follow-up Note  RE: Amanda Jackson MRN: [id number] DOB: 24-Jan-1941 Date of Office Visit: 11/24/2015  Primary care provider: Annye Asa, MD Referring provider: Midge Minium, MD  History of present illness: Amanda Jackson is a 75 y.o. female with allergic rhinitis, history of nasal polyps, and asthma presents today for follow up.  She was last seen in this clinic on 07/23/2015.  She reports that her nasal symptoms and cough have improved in the interval since her previous visit, however she still experiences some thick postnasal drainage, especially in the morning with a hot/humid weather.  The dry cough still occurs on occasion, however "is not as intense" as it was previously.  She is uncertain of having switch from taking omeprazole on a daily basis to taking it sporadically caused an increase in the cough.   Assessment and plan: Asthma  For now, continue Qvar 80 g, 2 inhalations via spacer device twice a day, montelukast 10 mg daily bedtime, and albuterol HFA every 4-6 hours as needed.  If  Amanda Jackson's subjective and objective measures of pulmonary function remain stable, we will consider stepping down therapy on the next visit.  History of nasal polyps  Continue daily use of Qnasl 80 g per nostril 1-2 times daily and nasal saline irrigation as needed.  Allergic rhinitis  Continue appropriate allergen avoidance measures,  montelukast 10 mg daily, nasal saline irrigation as needed, and Qnasl 80 g twice daily.  GERD (gastroesophageal reflux disease)  Continue appropriate reflux lifestyle modifications and omeprazole as previously prescribed.  Persistent cough Most likely multifactorial with contribution from postnasal drainage, bronchial hyperresponsiveness, and possibly acid reflux.  Treatment plan as outlined above.  Diagnositics: Spirometry reveals an FVC of 2.06 L (91% predicted) and an FEV1 of 1.37 L (79% predicted).  Mild airways obstruction unchanged from  previous study.  Please see scanned spirometry results for details.    Physical examination: Blood pressure 108/62, pulse 68, temperature 97.8 F (36.6 C), temperature source Oral.  General: Alert, interactive, in no acute distress. HEENT: TMs pearly gray, turbinates mildly edematous without discharge, post-pharynx moderately erythematous. Neck: Supple without lymphadenopathy. Lungs: Clear to auscultation without wheezing, rhonchi or rales. CV: Normal S1, S2 without murmurs. Skin: Warm and dry, without lesions or rashes.  The following portions of the patient's history were reviewed and updated as appropriate: allergies, current medications, past family history, past medical history, past social history, past surgical history and problem list.    Medication List       Accurate as of 11/24/15  7:19 PM. Always use your most recent med list.          amLODipine 10 MG tablet Commonly known as:  NORVASC Take 10 mg by mouth 2 (two) times daily.   aspirin 81 MG EC tablet Take 81 mg by mouth daily.   CALCIUM 600+D3 PO Take 600 mg by mouth 2 (two) times daily.   carvedilol 12.5 MG tablet Commonly known as:  COREG TAKE ONE TABLET BY MOUTH TWICE DAILY WITH A MEAL   CENTRUM tablet Take 1 tablet by mouth daily.   conjugated estrogens vaginal cream Commonly known as:  PREMARIN Place 0.5 g vaginally 2 (two) times a week. As needed for spotting   CORICIDIN HBP COUGH/COLD PO Take by mouth. Reported on 10/15/2015   FOLBIC 2.5-25-2 MG Tabs tablet Generic drug:  folic acid-pyridoxine-cyancobalamin TAKE 1 TABLET BY MOUTH DAILY.   ibuprofen 800 MG tablet Commonly known as:  ADVIL,MOTRIN TAKE 1 TABLET (800 MG  TOTAL) BY MOUTH 3 (THREE) TIMES DAILY AS NEEDED FOR PAIN   LORazepam 1 MG tablet Commonly known as:  ATIVAN Take 1 tablet (1 mg total) by mouth 2 (two) times daily.   losartan 50 MG tablet Commonly known as:  COZAAR Take 1 tablet (50 mg total) by mouth daily.     montelukast 10 MG tablet Commonly known as:  SINGULAIR TAKE 1 TABLET (10 MG TOTAL) BY MOUTH DAILY.   nitroGLYCERIN 0.4 MG SL tablet Commonly known as:  NITROSTAT AS DIRECTED   omeprazole 20 MG capsule Commonly known as:  PRILOSEC TAKE 1 CAPSULE (20 MG TOTAL) BY MOUTH 2 (TWO) TIMES DAILY.   potassium chloride SA 20 MEQ tablet Commonly known as:  K-DUR,KLOR-CON Take 1 tablet (20 mEq total) by mouth daily.   PROAIR HFA 108 (90 Base) MCG/ACT inhaler Generic drug:  albuterol TWO PUFFS EVERY 4 HOURS AS NEEDED FOR COUGH OR WHEEZE.   QNASL 80 MCG/ACT Aers Generic drug:  Beclomethasone Dipropionate ONE SPRAY EACH NOSTRIL TWICE DAILY FOR STUFFY NOSE OR DRAINAGE.   QVAR 80 MCG/ACT inhaler Generic drug:  beclomethasone TAKE TWO PUFFS TWICE DAILY TO PREVENT COUGH OR WHEEZE. RINSE,GARGLE AND SPIT. USE WITH SPACER.   spironolactone 25 MG tablet Commonly known as:  ALDACTONE Take 1 tablet (25 mg total) by mouth daily.   traMADol 50 MG tablet Commonly known as:  ULTRAM Take 50 mg by mouth every 6 (six) hours as needed. Reported on 07/23/2015   TYLENOL ARTHRITIS PAIN 650 MG CR tablet Generic drug:  acetaminophen Take 650 mg by mouth every 8 (eight) hours as needed for pain.       No Known Allergies  I appreciate the opportunity to take part in Amanda Jackson's care. Please do not hesitate to contact me with questions.  Sincerely,   R. Edgar Frisk, MD

## 2015-11-24 NOTE — Patient Instructions (Addendum)
Asthma  For now, continue Qvar 80 g, 2 inhalations via spacer device twice a day, montelukast 10 mg daily bedtime, and albuterol HFA every 4-6 hours as needed.  If  Suanne's subjective and objective measures of pulmonary function remain stable, we will consider stepping down therapy on the next visit.  History of nasal polyps  Continue daily use of Qnasl 80 g per nostril 1-2 times daily and nasal saline irrigation as needed.  Allergic rhinitis  Continue appropriate allergen avoidance measures,  montelukast 10 mg daily, nasal saline irrigation as needed, and Qnasl 80 g twice daily.  GERD (gastroesophageal reflux disease)  Continue appropriate reflux lifestyle modifications and omeprazole as previously prescribed.  Persistent cough Most likely multifactorial with contribution from postnasal drainage, bronchial hyperresponsiveness, and possibly acid reflux.  Treatment plan as outlined above.   Return in about 4 months (around 03/25/2016), or if symptoms worsen or fail to improve.

## 2015-11-24 NOTE — Assessment & Plan Note (Addendum)
   For now, continue Qvar 80 g, 2 inhalations via spacer device twice a day, montelukast 10 mg daily bedtime, and albuterol HFA every 4-6 hours as needed.  If  Amanda Jackson's subjective and objective measures of pulmonary function remain stable, we will consider stepping down therapy on the next visit.

## 2015-11-27 ENCOUNTER — Other Ambulatory Visit: Payer: Self-pay | Admitting: Family Medicine

## 2015-12-01 ENCOUNTER — Encounter: Payer: Self-pay | Admitting: Family Medicine

## 2015-12-01 ENCOUNTER — Encounter: Payer: Self-pay | Admitting: General Practice

## 2015-12-01 ENCOUNTER — Ambulatory Visit (INDEPENDENT_AMBULATORY_CARE_PROVIDER_SITE_OTHER): Payer: Medicare Other | Admitting: Family Medicine

## 2015-12-01 VITALS — BP 110/62 | HR 69 | Temp 97.9°F | Resp 16 | Ht 62.0 in | Wt 120.2 lb

## 2015-12-01 DIAGNOSIS — Z0000 Encounter for general adult medical examination without abnormal findings: Secondary | ICD-10-CM

## 2015-12-01 DIAGNOSIS — I10 Essential (primary) hypertension: Secondary | ICD-10-CM | POA: Diagnosis not present

## 2015-12-01 LAB — CBC WITH DIFFERENTIAL/PLATELET
BASOS ABS: 0 10*3/uL (ref 0.0–0.1)
Basophils Relative: 0.2 % (ref 0.0–3.0)
EOS ABS: 0.2 10*3/uL (ref 0.0–0.7)
Eosinophils Relative: 3.8 % (ref 0.0–5.0)
HCT: 35.5 % — ABNORMAL LOW (ref 36.0–46.0)
HEMOGLOBIN: 11.7 g/dL — AB (ref 12.0–15.0)
LYMPHS PCT: 28.1 % (ref 12.0–46.0)
Lymphs Abs: 1.5 10*3/uL (ref 0.7–4.0)
MCHC: 32.9 g/dL (ref 30.0–36.0)
MCV: 94.5 fl (ref 78.0–100.0)
MONO ABS: 0.7 10*3/uL (ref 0.1–1.0)
Monocytes Relative: 12.5 % — ABNORMAL HIGH (ref 3.0–12.0)
Neutro Abs: 3 10*3/uL (ref 1.4–7.7)
Neutrophils Relative %: 55.4 % (ref 43.0–77.0)
Platelets: 270 10*3/uL (ref 150.0–400.0)
RBC: 3.76 Mil/uL — AB (ref 3.87–5.11)
RDW: 13.9 % (ref 11.5–15.5)
WBC: 5.3 10*3/uL (ref 4.0–10.5)

## 2015-12-01 LAB — BASIC METABOLIC PANEL
BUN: 17 mg/dL (ref 6–23)
CALCIUM: 9.7 mg/dL (ref 8.4–10.5)
CO2: 25 meq/L (ref 19–32)
Chloride: 105 mEq/L (ref 96–112)
Creatinine, Ser: 0.74 mg/dL (ref 0.40–1.20)
GFR: 98.47 mL/min (ref >60.00)
GLUCOSE: 112 mg/dL — AB (ref 70–99)
Potassium: 4.6 mEq/L (ref 3.5–5.1)
SODIUM: 138 meq/L (ref 135–145)

## 2015-12-01 LAB — LIPID PANEL
CHOL/HDL RATIO: 2
CHOLESTEROL: 148 mg/dL (ref 0–200)
HDL: 66.4 mg/dL (ref >39.00)
LDL CALC: 69 mg/dL (ref 0–99)
NonHDL: 81.74
TRIGLYCERIDES: 63 mg/dL (ref 0.0–149.0)
VLDL: 12.6 mg/dL (ref 0.0–40.0)

## 2015-12-01 LAB — TSH: TSH: 2.52 u[IU]/mL (ref 0.35–4.50)

## 2015-12-01 LAB — HEPATIC FUNCTION PANEL
ALT: 14 U/L (ref 0–35)
AST: 15 U/L (ref 0–37)
Albumin: 4.1 g/dL (ref 3.5–5.2)
Alkaline Phosphatase: 62 U/L (ref 39–117)
BILIRUBIN DIRECT: 0.1 mg/dL (ref 0.0–0.3)
TOTAL PROTEIN: 6.9 g/dL (ref 6.0–8.3)
Total Bilirubin: 0.5 mg/dL (ref 0.2–1.2)

## 2015-12-01 MED ORDER — SILVER SULFADIAZINE 1 % EX CREA: 1.0000 "application " | TOPICAL_CREAM | Freq: Every day | CUTANEOUS | 0 refills | Status: DC

## 2015-12-01 MED ORDER — OMEPRAZOLE 20 MG PO CPDR: DELAYED_RELEASE_CAPSULE | ORAL | 1 refills | Status: DC

## 2015-12-01 NOTE — Assessment & Plan Note (Signed)
Chronic problem.  Currently well controlled.  Asymptomatic.  Check labs.  No anticipated med changes.  Will follow. 

## 2015-12-01 NOTE — Progress Notes (Signed)
Pre visit review using our clinic review tool, if applicable. No additional management support is needed unless otherwise documented below in the visit note. 

## 2015-12-01 NOTE — Patient Instructions (Signed)
Follow up in 6 months to recheck blood pressure We'll notify you of your lab results and make any changes if needed Continue to work on healthy diet and regular exercise- you look great! You are up to date on colonoscopy and likely will not need another- yay! You are up to date on mammogram until 04/23/16 and bone density until 2018 You are up to date on immunizations- yay! Call with any questions or concerns Enjoy the rest of your summer!!

## 2015-12-01 NOTE — Assessment & Plan Note (Signed)
Pt's PE WNL.  UTD on colonoscopy, mammo, DEXA, immunizations.  Written screening schedule updated and given to pt.  Check labs.  Anticipatory guidance provided.

## 2015-12-01 NOTE — Progress Notes (Signed)
   Subjective:    Patient ID: Amanda Jackson, female    DOB: Jan 14, 1941, 75 y.o.   MRN: [id number]  HPI Here today for CPE.  Risk Factors: HTN- chronic problem, on Losartan, Spironolactone, Amlodipine, Coreg w/ good control Physical Activity: limited due to L knee arthritis Fall Risk: low Depression: denies current sxs Hearing: normal to conversational tones, mildly decreased to whispered voice ADL's: independent Cognitive: normal linear thought process, memory and attention intact Home Safety: safe at home Height, Weight, BMI, Visual Acuity: see vitals, vision corrected to 20/20 w/ glasses Counseling: UTD on colonoscopy, mammo, DEXA, immunizations Care team reviewed and updated w/ pt Labs Ordered: See A&P Care Plan: See A&P    Review of Systems Patient reports no vision/ hearing changes, adenopathy,fever, weight change,  persistant/recurrent hoarseness , swallowing issues, chest pain, palpitations, edema, persistant/recurrent cough, hemoptysis, dyspnea (rest/exertional/paroxysmal nocturnal), gastrointestinal bleeding (melena, rectal bleeding), abdominal pain, significant heartburn, bowel changes, GU symptoms (dysuria, hematuria, incontinence), Gyn symptoms (abnormal  bleeding, pain),  syncope, focal weakness, memory loss, numbness & tingling, skin/hair/nail changes, abnormal bruising or bleeding, anxiety, or depression.     Objective:   Physical Exam General Appearance:    Alert, cooperative, no distress, appears stated age  Head:    Normocephalic, without obvious abnormality, atraumatic  Eyes:    PERRL, conjunctiva/corneas clear, EOM's intact, fundi    benign, both eyes  Ears:    Normal TM's and external ear canals, both ears  Nose:   Nares normal, septum midline, mucosa normal, no drainage    or sinus tenderness  Throat:   Lips, mucosa, and tongue normal; teeth and gums normal  Neck:   Supple, symmetrical, trachea midline, no adenopathy;    Thyroid: no  enlargement/tenderness/nodules  Back:     Symmetric, no curvature, ROM normal, no CVA tenderness  Lungs:     Clear to auscultation bilaterally, respirations unlabored  Chest Wall:    No tenderness or deformity   Heart:    Regular rate and rhythm, S1 and S2 normal, no murmur, rub   or gallop  Breast Exam:    Deferred to mammo  Abdomen:     Soft, non-tender, bowel sounds active all four quadrants,    no masses, no organomegaly  Genitalia:    Deferred  Rectal:    Extremities:   Extremities normal, atraumatic, no cyanosis or edema  Pulses:   2+ and symmetric all extremities  Skin:   Skin color, texture, turgor normal, no rashes or lesions  Lymph nodes:   Cervical, supraclavicular, and axillary nodes normal  Neurologic:   CNII-XII intact, normal strength, sensation and reflexes    throughout          Assessment & Plan:

## 2015-12-10 ENCOUNTER — Other Ambulatory Visit: Payer: Self-pay | Admitting: Cardiology

## 2015-12-22 ENCOUNTER — Other Ambulatory Visit: Payer: Self-pay | Admitting: Pediatrics

## 2015-12-23 ENCOUNTER — Other Ambulatory Visit: Payer: Self-pay | Admitting: Family Medicine

## 2015-12-29 ENCOUNTER — Other Ambulatory Visit: Payer: Self-pay | Admitting: Allergy and Immunology

## 2016-01-01 ENCOUNTER — Telehealth: Payer: Self-pay | Admitting: Allergy and Immunology

## 2016-01-01 NOTE — Telephone Encounter (Signed)
Dr Verlin Fester please advise if we can switch patient's medication

## 2016-01-01 NOTE — Telephone Encounter (Addendum)
Samples will be provided insurance has approved PA but patient cannot afford price.

## 2016-01-01 NOTE — Telephone Encounter (Signed)
Please provide her with samples of Qnasl 80 g.  Please send through a prior authorization to her insurance company.  If the prior authorization has not accepted, she will have to use Nasonex.  Thanks.

## 2016-01-01 NOTE — Telephone Encounter (Signed)
She wants to know if there is something different she can get in place of Qnasal or if we can speak with her insurance to see if she can get a lower price. It is more expensive than she can afford. She had also asked if we would have any samples of the Qnasal.  She uses [facility] on [address]

## 2016-01-05 ENCOUNTER — Other Ambulatory Visit: Payer: Self-pay | Admitting: Family Medicine

## 2016-01-05 NOTE — Telephone Encounter (Signed)
I believe so. Can we switch to different nasal spray?

## 2016-01-05 NOTE — Telephone Encounter (Signed)
Was the last part of your note trucated?

## 2016-01-05 NOTE — Telephone Encounter (Signed)
Which steroid nasal sprays does her insurance cover?

## 2016-01-06 ENCOUNTER — Other Ambulatory Visit: Payer: Self-pay

## 2016-01-06 MED ORDER — FLUTICASONE PROPIONATE 50 MCG/ACT NA SUSP: 2.0000 | Freq: Every day | NASAL | 5 refills | Status: DC

## 2016-01-06 NOTE — Telephone Encounter (Signed)
flonase

## 2016-01-06 NOTE — Telephone Encounter (Signed)
Sent in flonase and will notify pt

## 2016-01-06 NOTE — Telephone Encounter (Signed)
Lm for pt to call us back  

## 2016-01-06 NOTE — Telephone Encounter (Signed)
Flonase it is then. Thanks.

## 2016-01-16 ENCOUNTER — Other Ambulatory Visit: Payer: Self-pay | Admitting: Allergy and Immunology

## 2016-02-06 IMAGING — CT CT ABD-PELV W/ CM
2 of 4 series · 17 of 46 positions shown, 19 images · IV contrast (Omnipaque 300)
Comparison: 03/30/2014

CLINICAL DATA: Heme positive stool. Left-sided abdominal pain for 3
months

EXAM:
CT ABDOMEN AND PELVIS WITH CONTRAST
TECHNIQUE: Multidetector CT imaging of the abdomen and pelvis was performed
using the standard protocol following bolus administration of
intravenous contrast.
CONTRAST:  100 cc of Omni 300

[Series 2: abd/ pel 5mm · axial · 0.70mm/px · z∈[+739,+1094]mm · 14 of 79 slices shown, 16 images]
[im 4/79  soft-tissue]
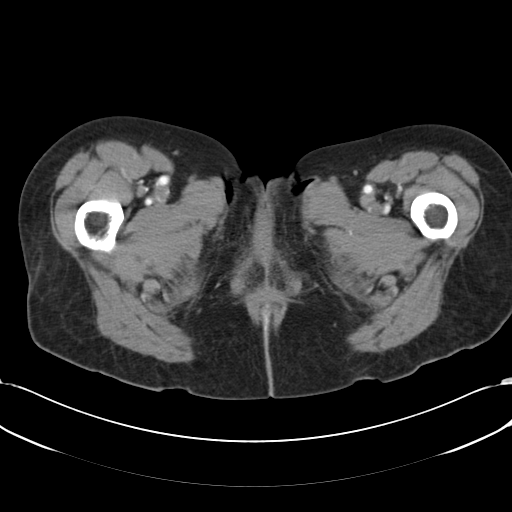
[im 4/79  bone]
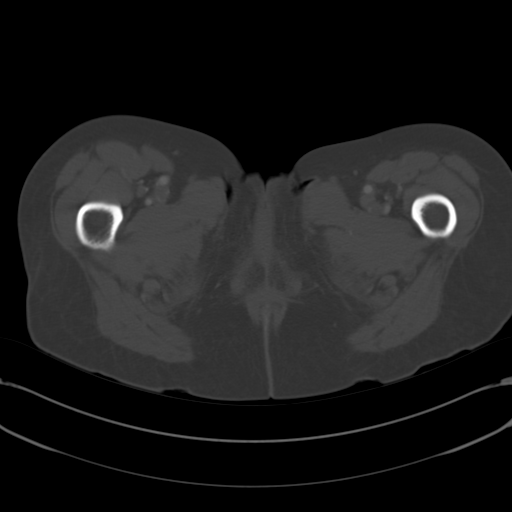
[im 11/79  soft-tissue]
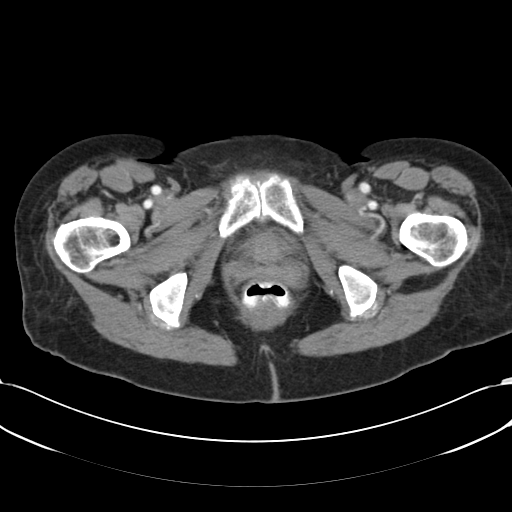
[im 15/79  soft-tissue]
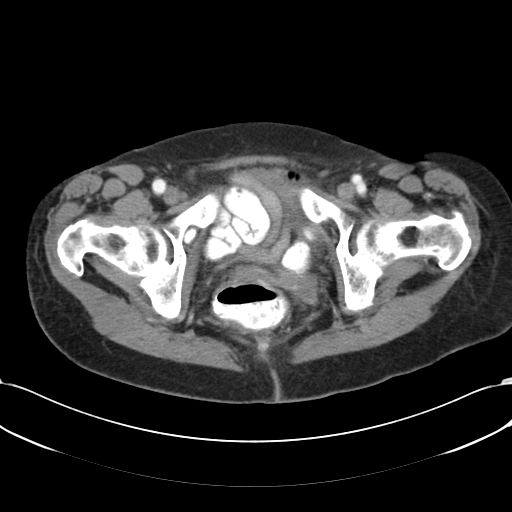
[im 22/79  soft-tissue]
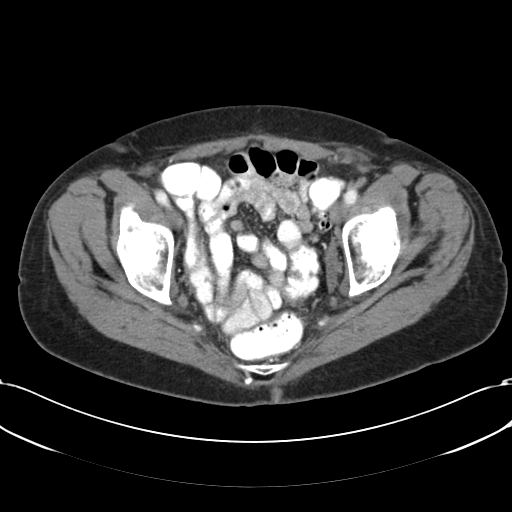
[im 25/79  soft-tissue]
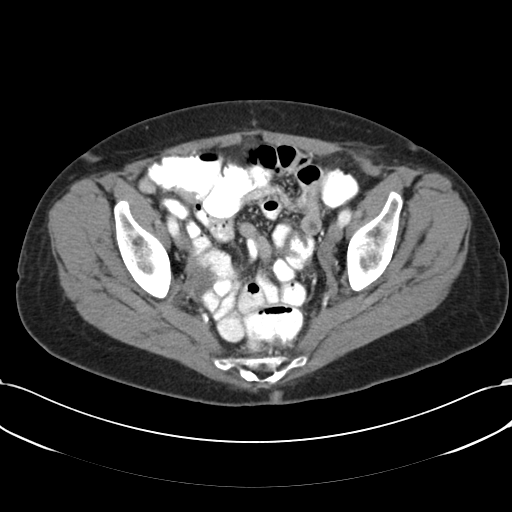
[im 32/79  soft-tissue]
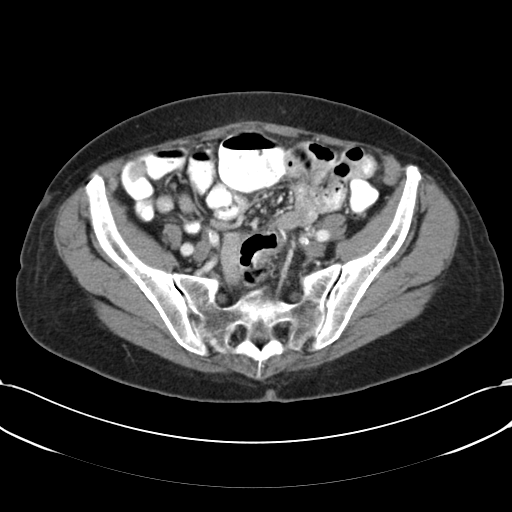
[im 36/79  soft-tissue]
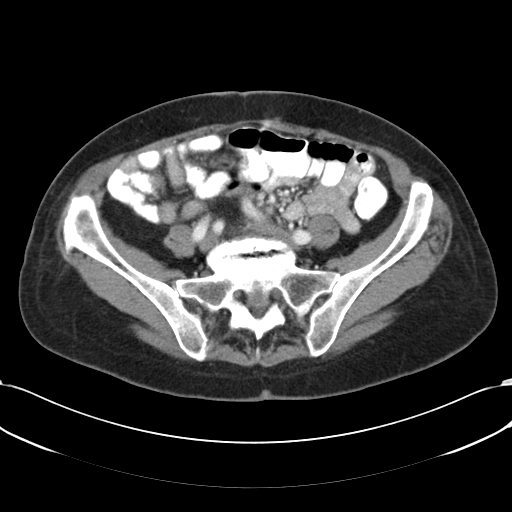
[im 43/79  soft-tissue]
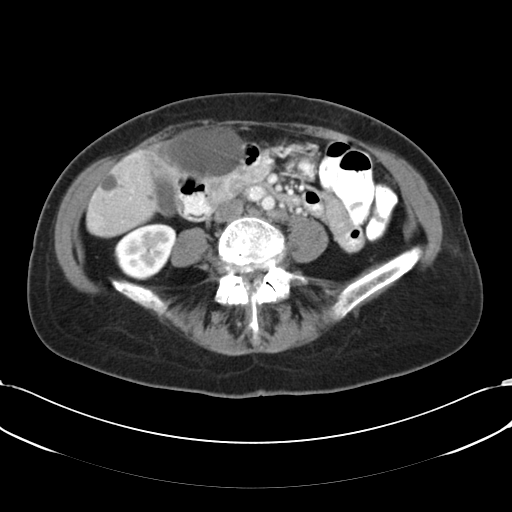
[im 47/79  soft-tissue]
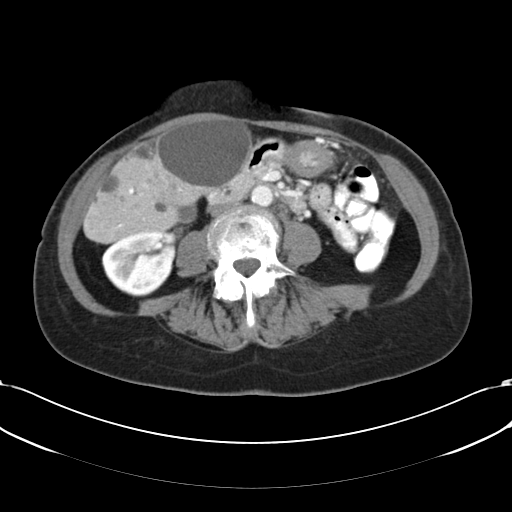
[im 47/79  bone]
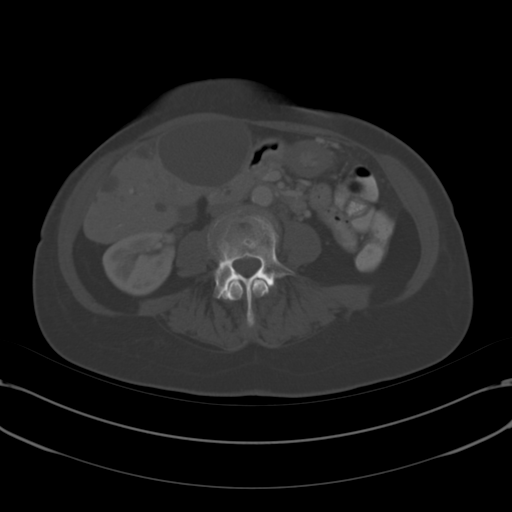
[im 54/79  soft-tissue]
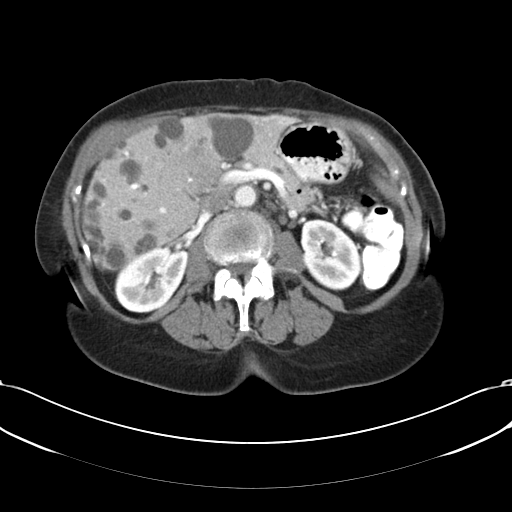
[im 57/79  soft-tissue]
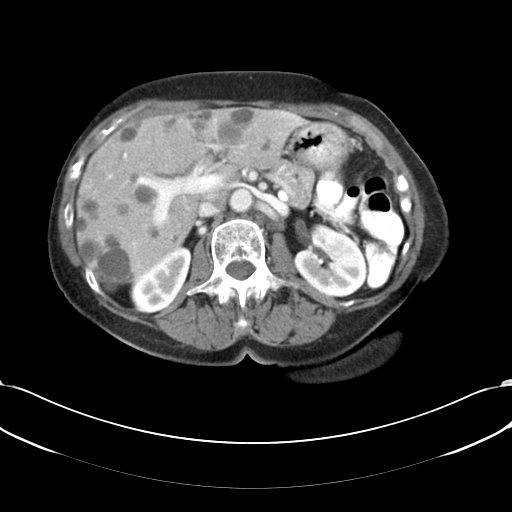
[im 64/79  soft-tissue]
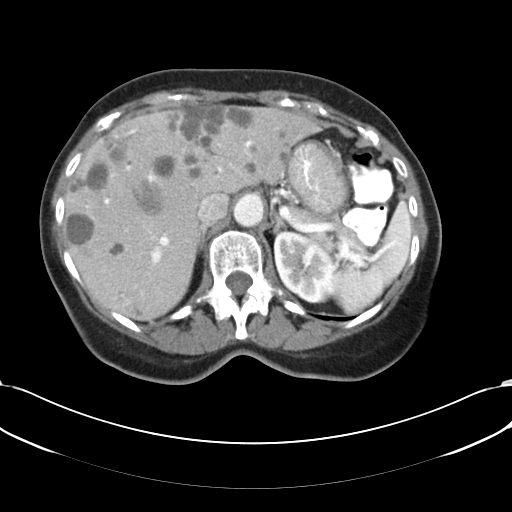
[im 68/79  soft-tissue]
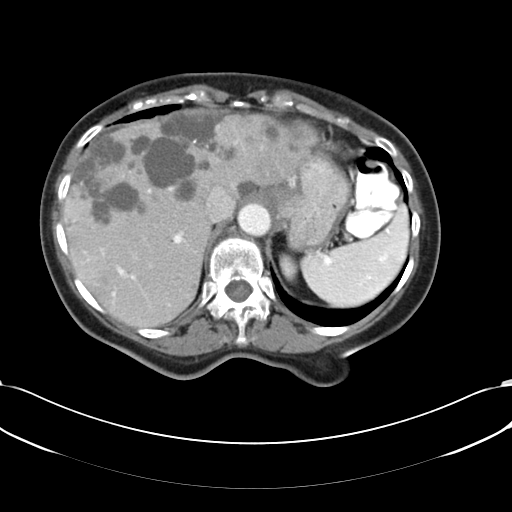
[im 75/79  soft-tissue]
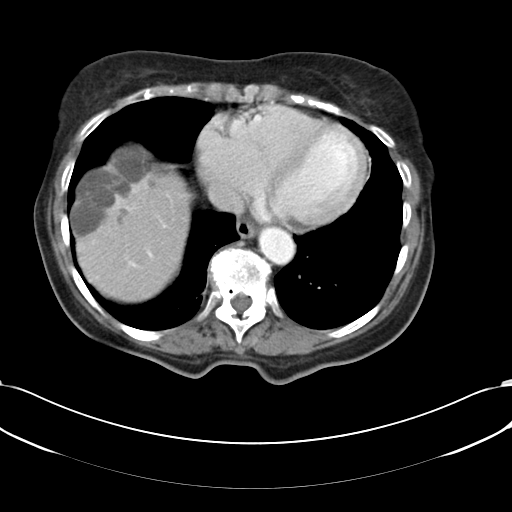

[Series 602: <mpr range> · coronal · 0.80mm/px · 3 of 104 slices shown]
[im 35/104  soft-tissue]
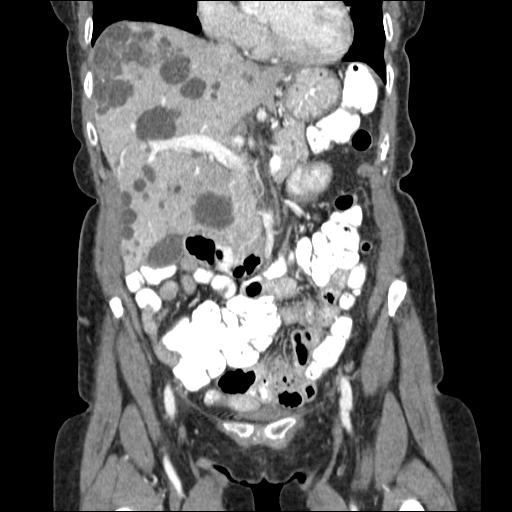
[im 46/104  soft-tissue]
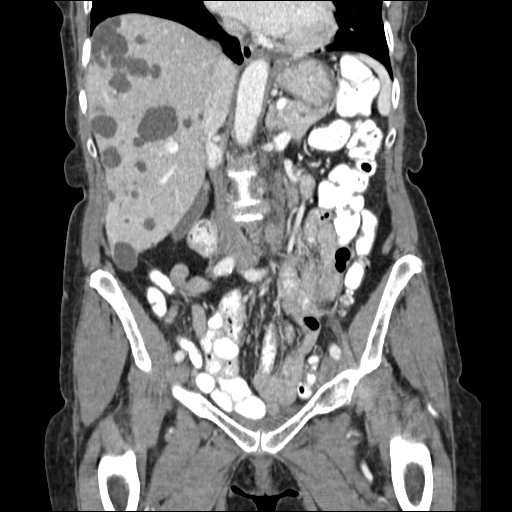
[im 58/104  soft-tissue]
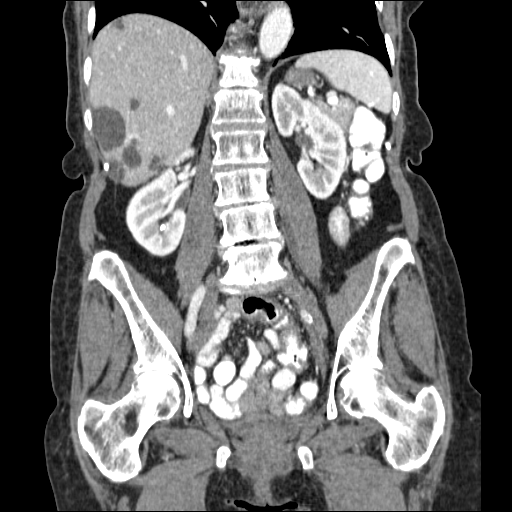

[17 of 46 positions shown; findings below may reference images not displayed]

FINDINGS: Lower chest: No pleural effusion identified. The lung bases are
clear.

Hepatobiliary: Changes of polycystic liver disease are again
identified. The gallbladder appears normal. No biliary dilatation.

Pancreas: Normal appearance of the pancreas.

Spleen: The spleen contains several calcified granulomas.

Adrenals/Urinary Tract: Normal adrenal glands. Renal stone is
identified within the inferior pole of the right kidney measuring 4
mm, image 36 of series 2. Left kidney appears normal. The urinary
bladder is within normal limits.

Stomach/Bowel: The stomach and the small bowel loops have a normal
course and caliber. No bowel obstruction. Normal appearance of the
proximal colon. The distal colon is unremarkable.

Vascular/Lymphatic: Mild calcified atherosclerotic change involves
the abdominal aorta. No upper abdominal adenopathy. There is no
pelvic or inguinal adenopathy.

Reproductive: Previous hysterectomy.  No adnexal mass.

Other: There is no ascites or focal fluid collections within the
abdomen or pelvis.

Musculoskeletal: No aggressive lytic or sclerotic bone lesions
noted. There is degenerative disc disease identified within the
lower lumbar spine.
IMPRESSION: 1. No acute findings within the abdomen or pelvis. No explanation
for heme-positive stool and left-sided abdominal pain.
2. Changes of polycystic liver disease.
3. Nonobstructing right renal calculi
4. Aortic atherosclerosis.

## 2016-02-19 ENCOUNTER — Other Ambulatory Visit: Payer: Self-pay | Admitting: Allergy and Immunology

## 2016-04-03 ENCOUNTER — Other Ambulatory Visit: Payer: Self-pay | Admitting: Family Medicine

## 2016-04-08 ENCOUNTER — Other Ambulatory Visit: Payer: Self-pay | Admitting: Family Medicine

## 2016-04-08 NOTE — Telephone Encounter (Signed)
Medication filled to pharmacy as requested.   

## 2016-04-08 NOTE — Telephone Encounter (Signed)
Last OV 12/01/15 Lorazepam last filled 12/11/15 #60 with 3

## 2016-04-17 ENCOUNTER — Other Ambulatory Visit: Payer: Self-pay | Admitting: Allergy and Immunology

## 2016-05-11 ENCOUNTER — Other Ambulatory Visit: Payer: Self-pay | Admitting: Allergy and Immunology

## 2016-05-11 NOTE — Telephone Encounter (Signed)
Patient needs office visit for additional refills 

## 2016-05-14 ENCOUNTER — Other Ambulatory Visit: Payer: Self-pay | Admitting: Allergy and Immunology

## 2016-05-24 ENCOUNTER — Other Ambulatory Visit: Payer: Self-pay | Admitting: Allergy and Immunology

## 2016-05-25 NOTE — Telephone Encounter (Signed)
We are given one refill must keep appointment that is scheduled for Feb. 5 2018.

## 2016-05-31 ENCOUNTER — Other Ambulatory Visit: Payer: Self-pay | Admitting: Allergy and Immunology

## 2016-05-31 ENCOUNTER — Encounter: Payer: Self-pay | Admitting: Allergy and Immunology

## 2016-05-31 ENCOUNTER — Ambulatory Visit (INDEPENDENT_AMBULATORY_CARE_PROVIDER_SITE_OTHER): Payer: Medicare Other | Admitting: Allergy and Immunology

## 2016-05-31 ENCOUNTER — Encounter (INDEPENDENT_AMBULATORY_CARE_PROVIDER_SITE_OTHER): Payer: Self-pay

## 2016-05-31 VITALS — BP 110/60 | HR 72 | Resp 16

## 2016-05-31 DIAGNOSIS — J4530 Mild persistent asthma, uncomplicated: Secondary | ICD-10-CM | POA: Diagnosis not present

## 2016-05-31 DIAGNOSIS — K219 Gastro-esophageal reflux disease without esophagitis: Secondary | ICD-10-CM | POA: Diagnosis not present

## 2016-05-31 DIAGNOSIS — J3089 Other allergic rhinitis: Secondary | ICD-10-CM | POA: Diagnosis not present

## 2016-05-31 DIAGNOSIS — R05 Cough: Secondary | ICD-10-CM | POA: Diagnosis not present

## 2016-05-31 DIAGNOSIS — R053 Chronic cough: Secondary | ICD-10-CM

## 2016-05-31 MED ORDER — CARBINOXAMINE MALEATE ER 4 MG/5ML PO SUER: 8.0000 mg | Freq: Two times a day (BID) | ORAL | 3 refills | Status: DC

## 2016-05-31 NOTE — Progress Notes (Addendum)
Follow-up Note  RE: Amanda DEZEEUW MRN: [id number] DOB: Feb 19, 1941 Date of Office Visit: 05/31/2016  Primary care provider: Annye Asa, MD Referring provider: Midge Minium, MD  History of present illness: Amanda Jackson is a 76 y.o. female with asthma, allergic rhinitis, esophageal reflux, history of persistent cough, and history of nasal polyps presents today for follow up.  She was last seen in this clinic in July 2017.  She reports that she her persistent cough has returned.  She complains of the sensation of a globus sensation in the base of her throat which she is unable to expectorate.  She states that she has been compliant with her medications as prescribed.  She does experience wheezing 3 times per week on average.  She has not been experiencing overt heartburn.  She has no specific nasal symptom complaints today.   Assessment and plan: Persistent cough Most likely multifactorial with contribution from postnasal drainage, bronchial hyperresponsiveness, and possibly acid reflux.  Treatment plan as outlined above.  A prescription has been provided for [facility] ER (cabinoxamine) 8-10 mg twice daily as needed. The patient has been warned about the side effect of somnolence and asked not to drive after using this medication.  She has verbalized understanding.  History persist or progress despite treatment plan as outlined, she will seek further otolaryngology evaluation.  Mild persistent asthma  For now, continue Qvar 80 g, 2 inhalations via spacer device twice a day, montelukast 10 mg daily, and albuterol HFA every 4-6 hours as needed.  Subjective and objective measures of pulmonary function will be followed and the treatment plan will be adjusted accordingly.  Allergic rhinitis  Continue appropriate allergen avoidance measures,  montelukast 10 mg daily, nasal saline irrigation as needed, and Qnasl 80 g twice daily.  GERD (gastroesophageal reflux disease)  For  now, continue appropriate reflux last all modifications and omeprazole as prescribed.   Meds ordered this encounter  Medications  . Carbinoxamine Maleate ER [facility] ER) 4 MG/5ML SUER    Sig: Take 8-10 mg by mouth 2 (two) times daily.    Dispense:  120 mL    Refill:  3    Diagnostics: Spirometry reveals an FVC of 1.70 L (81% predicted) and an FEV1 of 1.19 L (74% predicted) with 90 mL (8%) postbronchodilator improvement.  Please see scanned spirometry results for details.    Physical examination: Blood pressure 110/60, pulse 72, resp. rate 16.  General: Alert, interactive, in no acute distress. HEENT: TMs pearly gray, turbinates moderately edematous with clear discharge, post-pharynx moderately erythematous. Neck: Supple without lymphadenopathy. Lungs: Clear to auscultation without wheezing, rhonchi or rales. CV: Normal S1, S2 without murmurs. Skin: Warm and dry, without lesions or rashes.  The following portions of the patient's history were reviewed and updated as appropriate: allergies, current medications, past family history, past medical history, past social history, past surgical history and problem list.  Allergies as of 05/31/2016   No Known Allergies     Medication List       Accurate as of 05/31/16  1:36 PM. Always use your most recent med list.          amLODipine 10 MG tablet Commonly known as:  NORVASC TAKE 1 TABLET BY MOUTH TWICE A DAY   aspirin 81 MG EC tablet Take 81 mg by mouth daily.   CALCIUM 600+D3 PO Take 600 mg by mouth 2 (two) times daily.   Carbinoxamine Maleate ER 4 MG/5ML Suer Commonly known as:  KARBINAL ER  Take 8-10 mg by mouth 2 (two) times daily.   carvedilol 12.5 MG tablet Commonly known as:  COREG TAKE ONE TABLET BY MOUTH TWICE DAILY WITH A MEAL   CENTRUM tablet Take 1 tablet by mouth daily.   conjugated estrogens vaginal cream Commonly known as:  PREMARIN Place 0.5 g vaginally 2 (two) times a week. As needed for spotting     FOLBIC 2.5-25-2 MG Tabs tablet Generic drug:  folic acid-pyridoxine-cyancobalamin TAKE 1 TABLET BY MOUTH DAILY   ibuprofen 800 MG tablet Commonly known as:  ADVIL,MOTRIN TAKE 1 TABLET (800 MG TOTAL) BY MOUTH 3 (THREE) TIMES DAILY AS NEEDED FOR PAIN   LORazepam 1 MG tablet Commonly known as:  ATIVAN TAKE 1 TABLET BY MOUTH TWICE A DAY   losartan 50 MG tablet Commonly known as:  COZAAR Take 1 tablet (50 mg total) by mouth daily.   montelukast 10 MG tablet Commonly known as:  SINGULAIR TAKE 1 TABLET (10 MG TOTAL) BY MOUTH DAILY.   nitroGLYCERIN 0.4 MG SL tablet Commonly known as:  NITROSTAT AS DIRECTED   omeprazole 20 MG capsule Commonly known as:  PRILOSEC TAKE 1 CAPSULE (20 MG TOTAL) BY MOUTH 2 (TWO) TIMES DAILY.   potassium chloride SA 20 MEQ tablet Commonly known as:  K-DUR,KLOR-CON Take 1 tablet (20 mEq total) by mouth daily.   PROAIR HFA 108 (90 Base) MCG/ACT inhaler Generic drug:  albuterol INHALE 2 PUFFS BY MOUTH EVERY 4 HOURS AS NEEDED FOR COUGH OR WHEEZE   QNASL 80 MCG/ACT Aers Generic drug:  Beclomethasone Dipropionate ONE SPRAY EACH NOSTRIL TWICE DAILY FOR STUFFY NOSE OR DRAINAGE.   QVAR 80 MCG/ACT inhaler Generic drug:  beclomethasone TAKE TWO PUFFS TWICE DAILY TO PREVENT COUGH OR WHEEZE. RINSE,GARGLE AND SPIT. USE WITH SPACER.   spironolactone 25 MG tablet Commonly known as:  ALDACTONE Take 1 tablet (25 mg total) by mouth daily.   traMADol 50 MG tablet Commonly known as:  ULTRAM Take 50 mg by mouth every 6 (six) hours as needed. Reported on 07/23/2015   TYLENOL ARTHRITIS PAIN 650 MG CR tablet Generic drug:  acetaminophen Take 650 mg by mouth every 8 (eight) hours as needed for pain.       No Known Allergies  Review of systems: Review of systems negative except as noted in HPI / PMHx or noted below: Constitutional: Negative.  HENT: Negative.   Eyes: Negative.  Respiratory: Negative.   Cardiovascular: Negative.  Gastrointestinal: Negative.   Genitourinary: Negative.  Musculoskeletal: Negative.  Neurological: Negative.  Endo/Heme/Allergies: Negative.  Cutaneous: Negative.  Past Medical History:  Diagnosis Date  . Allergy   . Anxiety   . Arthritis   . Asthma   . Atypical chest pain    Normal Myoview 2008  . Colon polyp   . GERD (gastroesophageal reflux disease)   . Hx of colonoscopy 03/18/09  . Hyperlipidemia   . Hypertension   . Left nasal polyps   . MVP (mitral valve prolapse)   . Seasonal allergies   . Tubulovillous adenoma     Family History  Problem Relation Age of Onset  . Hypertension Son   . Lung cancer Father   . Cancer Father   . Hypertension Father   . Liver cancer Mother   . Cancer Mother   . Hypertension Mother   . Colon cancer Mother   . Colon cancer Sister   . Heart murmur Son   . Stomach cancer Neg Hx   . Ulcerative colitis Neg Hx   .  Allergic rhinitis Neg Hx   . Angioedema Neg Hx   . Asthma Neg Hx     Social History   Social History  . Marital status: Divorced    Spouse name: N/A  . Number of children: N/A  . Years of education: N/A   Occupational History  . 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 Smoking status: Former Smoker    Quit date: 09/09/1977  . Smokeless tobacco: Never Used     Comment: used to smoke socially. Quit in 1979.  Marland Kitchen Alcohol use Yes     Comment: usually has a glass of wine with dinner every evening  . Drug use: No  . Sexual activity: Not Currently    Birth control/ protection: Other-see comments     Comment: HYST   Other Topics Concern  . Not on file   Social History Narrative  . No narrative on file    I appreciate the opportunity to take part in Ravleen's care. Please do not hesitate to contact me with questions.  Sincerely,   R. Edgar Frisk, MD

## 2016-05-31 NOTE — Assessment & Plan Note (Signed)
   For now, continue Qvar 80 g, 2 inhalations via spacer device twice a day, montelukast 10 mg daily, and albuterol HFA every 4-6 hours as needed.  Subjective and objective measures of pulmonary function will be followed and the treatment plan will be adjusted accordingly.

## 2016-05-31 NOTE — Patient Instructions (Addendum)
Persistent cough Most likely multifactorial with contribution from postnasal drainage, bronchial hyperresponsiveness, and possibly acid reflux.  Treatment plan as outlined above.  A prescription has been provided for [facility] ER (cabinoxamine) 8-10 mg twice daily as needed. The patient has been warned about the side effect of somnolence and asked not to drive after using this medication.  She has verbalized understanding.  History persist or progress despite treatment plan as outlined, she will seek further otolaryngology evaluation.  Mild persistent asthma  For now, continue Qvar 80 g, 2 inhalations via spacer device twice a day, montelukast 10 mg daily, and albuterol HFA every 4-6 hours as needed.  Subjective and objective measures of pulmonary function will be followed and the treatment plan will be adjusted accordingly.  Allergic rhinitis  Continue appropriate allergen avoidance measures,  montelukast 10 mg daily, nasal saline irrigation as needed, and Qnasl 80 g twice daily.  GERD (gastroesophageal reflux disease)  For now, continue appropriate reflux last all modifications and omeprazole as prescribed.   Return in about 4 months (around 09/28/2016), or if symptoms worsen or fail to improve.  Lifestyle Changes for Controlling GERD  When you have GERD, stomach acid feels as if it's backing up toward your mouth. Whether or not you take medication to control your GERD, your symptoms can often be improved with lifestyle changes.   Raise Your Head  Reflux is more likely to strike when you're lying down flat, because stomach fluid can  flow backward more easily. Raising the head of your bed 4-6 inches can help. To do this:  Slide blocks or books under the legs at the head of your bed. Or, place a wedge under  the mattress. Many foam stores can make a suitable wedge for you. The wedge  should run from your waist to the top of your head.  Don't just prop your head on several  pillows. This increases pressure on your  stomach. It can make GERD worse.  Watch Your Eating Habits Certain foods may increase the acid in your stomach or relax the lower esophageal sphincter, making GERD more likely. It's best to avoid the following:  Coffee, tea, and carbonated drinks (with and without caffeine)  Fatty, fried, or spicy food  Mint, chocolate, onions, and tomatoes  Any other foods that seem to irritate your stomach or cause you pain  Relieve the Pressure  Eat smaller meals, even if you have to eat more often.  Don't lie down right after you eat. Wait a few hours for your stomach to empty.  Avoid tight belts and tight-fitting clothes.  Lose excess weight.  Tobacco and Alcohol  Avoid smoking tobacco and drinking alcohol. They can make GERD symptoms worse.

## 2016-05-31 NOTE — Assessment & Plan Note (Signed)
   For now, continue appropriate reflux last all modifications and omeprazole as prescribed.

## 2016-05-31 NOTE — Assessment & Plan Note (Addendum)
Most likely multifactorial with contribution from postnasal drainage, bronchial hyperresponsiveness, and possibly acid reflux.  Treatment plan as outlined above.  A prescription has been provided for [facility] ER (cabinoxamine) 8-10 mg twice daily as needed. The patient has been warned about the side effect of somnolence and asked not to drive after using this medication.  She has verbalized understanding.  History persist or progress despite treatment plan as outlined, she will seek further otolaryngology evaluation.

## 2016-05-31 NOTE — Assessment & Plan Note (Signed)
   Continue appropriate allergen avoidance measures,  montelukast 10 mg daily, nasal saline irrigation as needed, and Qnasl 80 g twice daily.

## 2016-06-01 ENCOUNTER — Encounter: Payer: Self-pay | Admitting: Family Medicine

## 2016-06-01 ENCOUNTER — Ambulatory Visit (INDEPENDENT_AMBULATORY_CARE_PROVIDER_SITE_OTHER): Payer: Medicare Other | Admitting: Family Medicine

## 2016-06-01 VITALS — BP 118/73 | HR 78 | Temp 98.0°F | Resp 16 | Ht 62.0 in | Wt 119.0 lb

## 2016-06-01 DIAGNOSIS — I10 Essential (primary) hypertension: Secondary | ICD-10-CM

## 2016-06-01 LAB — CBC WITH DIFFERENTIAL/PLATELET
BASOS ABS: 0 10*3/uL (ref 0.0–0.1)
Basophils Relative: 0.4 % (ref 0.0–3.0)
Eosinophils Absolute: 0.2 10*3/uL (ref 0.0–0.7)
Eosinophils Relative: 3.9 % (ref 0.0–5.0)
HCT: 37.3 % (ref 36.0–46.0)
Hemoglobin: 12.2 g/dL (ref 12.0–15.0)
LYMPHS ABS: 1.5 10*3/uL (ref 0.7–4.0)
Lymphocytes Relative: 26.5 % (ref 12.0–46.0)
MCHC: 32.8 g/dL (ref 30.0–36.0)
MCV: 94.2 fl (ref 78.0–100.0)
MONO ABS: 0.8 10*3/uL (ref 0.1–1.0)
Monocytes Relative: 14.3 % — ABNORMAL HIGH (ref 3.0–12.0)
NEUTROS PCT: 54.9 % (ref 43.0–77.0)
Neutro Abs: 3 10*3/uL (ref 1.4–7.7)
PLATELETS: 298 10*3/uL (ref 150.0–400.0)
RBC: 3.96 Mil/uL (ref 3.87–5.11)
RDW: 13.6 % (ref 11.5–15.5)
WBC: 5.5 10*3/uL (ref 4.0–10.5)

## 2016-06-01 LAB — BASIC METABOLIC PANEL
BUN: 21 mg/dL (ref 6–23)
CO2: 26 meq/L (ref 19–32)
Calcium: 9.7 mg/dL (ref 8.4–10.5)
Chloride: 106 mEq/L (ref 96–112)
Creatinine, Ser: 0.76 mg/dL (ref 0.40–1.20)
GFR: 95.35 mL/min (ref >60.00)
GLUCOSE: 101 mg/dL — AB (ref 70–99)
POTASSIUM: 4.6 meq/L (ref 3.5–5.1)
SODIUM: 137 meq/L (ref 135–145)

## 2016-06-01 NOTE — Assessment & Plan Note (Signed)
Excellent control today.  Asymptomatic.  Check labs.  No anticipated med changes.  Will follow.

## 2016-06-01 NOTE — Progress Notes (Signed)
   Subjective:    Patient ID: Amanda Jackson, female    DOB: 1941-03-17, 76 y.o.   MRN: [id number]  HPI HTN- chronic problem, on Amlodipine '10mg'$ , Coreg 12.'5mg'$  BID, Losartan '50mg'$  and Spironolactone '25mg'$  w/ good control.  Denies CP, SOB w/ exception of asthma, HAs, visual changes, edema.  Eating a low salt diet, limited exercise.   Review of Systems For ROS see HPI     Objective:   Physical Exam  Constitutional: She is oriented to person, place, and time. She appears well-developed and well-nourished. No distress.  HENT:  Head: Normocephalic and atraumatic.  Eyes: Conjunctivae and EOM are normal. Pupils are equal, round, and reactive to light.  Neck: Normal range of motion. Neck supple. No thyromegaly present.  Cardiovascular: Normal rate, regular rhythm, normal heart sounds and intact distal pulses.   No murmur heard. Pulmonary/Chest: Effort normal and breath sounds normal. No respiratory distress.  Abdominal: Soft. She exhibits no distension. There is no tenderness.  Musculoskeletal: She exhibits no edema.  Lymphadenopathy:    She has no cervical adenopathy.  Neurological: She is alert and oriented to person, place, and time.  Skin: Skin is warm and dry.  Psychiatric: She has a normal mood and affect. Her behavior is normal.  Vitals reviewed.         Assessment & Plan:

## 2016-06-01 NOTE — Progress Notes (Signed)
Pre visit review using our clinic review tool, if applicable. No additional management support is needed unless otherwise documented below in the visit note. 

## 2016-06-01 NOTE — Patient Instructions (Signed)
Schedule your complete physical in 6 months We'll notify you of your lab results and make any changes if needed Keep up the good work on healthy diet and regular exercise- you look great!!! Call with any questions or concerns Happy New Year!!

## 2016-06-02 ENCOUNTER — Encounter: Payer: Self-pay | Admitting: General Practice

## 2016-06-13 ENCOUNTER — Other Ambulatory Visit: Payer: Self-pay | Admitting: Family Medicine

## 2016-06-14 NOTE — Telephone Encounter (Signed)
Medication filled to pharmacy as requested.   

## 2016-06-14 NOTE — Telephone Encounter (Signed)
Last OV 06/01/16 Lorazepam last filled 04/08/16 #60 with 1

## 2016-06-30 ENCOUNTER — Other Ambulatory Visit: Payer: Self-pay | Admitting: Family Medicine

## 2016-06-30 ENCOUNTER — Other Ambulatory Visit: Payer: Self-pay | Admitting: Cardiology

## 2016-07-01 ENCOUNTER — Other Ambulatory Visit: Payer: Self-pay | Admitting: Allergy and Immunology

## 2016-07-01 LAB — HM MAMMOGRAPHY

## 2016-07-02 ENCOUNTER — Other Ambulatory Visit: Payer: Self-pay

## 2016-07-02 ENCOUNTER — Telehealth: Payer: Self-pay | Admitting: Allergy and Immunology

## 2016-07-02 MED ORDER — BECLOMETHASONE DIPROPIONATE 80 MCG/ACT NA AERS: INHALATION_SPRAY | NASAL | 4 refills | Status: DC

## 2016-07-02 MED ORDER — FLUTICASONE PROPIONATE HFA 110 MCG/ACT IN AERO: 2.0000 | INHALATION_SPRAY | Freq: Two times a day (BID) | RESPIRATORY_TRACT | 5 refills | Status: DC

## 2016-07-02 NOTE — Telephone Encounter (Signed)
patient is wanting samples of QVAR, QNASL, and Karbinal ER Please call patient to let her know if any are available

## 2016-07-02 NOTE — Telephone Encounter (Signed)
Called patient to inform her that Qvar is no longer available and we don't have any samples of Qvar,and Qnsal 80. I advised that we sent a script in Flovent 110 2 puffs twice a day, and we sent a refill Qnsal 80. I put Karbinal sample up front for her to pick up. Patient stated that her Insurance doesn't cover [place].

## 2016-07-06 ENCOUNTER — Encounter: Payer: Self-pay | Admitting: General Practice

## 2016-08-10 ENCOUNTER — Other Ambulatory Visit: Payer: Self-pay | Admitting: Family Medicine

## 2016-08-11 ENCOUNTER — Other Ambulatory Visit: Payer: Self-pay | Admitting: Allergy and Immunology

## 2016-08-11 NOTE — Telephone Encounter (Signed)
Last OV 06/01/16 Lorazepam last filled 06/14/16 #60 with 1

## 2016-08-11 NOTE — Telephone Encounter (Signed)
Medication filled to pharmacy as requested.   

## 2016-08-31 ENCOUNTER — Encounter: Payer: Self-pay | Admitting: Allergy and Immunology

## 2016-08-31 ENCOUNTER — Ambulatory Visit (INDEPENDENT_AMBULATORY_CARE_PROVIDER_SITE_OTHER): Payer: Medicare Other | Admitting: Allergy and Immunology

## 2016-08-31 VITALS — BP 94/60 | HR 72 | Temp 98.2°F | Resp 14 | Ht 60.25 in | Wt 116.4 lb

## 2016-08-31 DIAGNOSIS — W57XXXA Bitten or stung by nonvenomous insect and other nonvenomous arthropods, initial encounter: Secondary | ICD-10-CM

## 2016-08-31 DIAGNOSIS — J3089 Other allergic rhinitis: Secondary | ICD-10-CM

## 2016-08-31 DIAGNOSIS — J339 Nasal polyp, unspecified: Secondary | ICD-10-CM | POA: Diagnosis not present

## 2016-08-31 DIAGNOSIS — J4530 Mild persistent asthma, uncomplicated: Secondary | ICD-10-CM | POA: Diagnosis not present

## 2016-08-31 MED ORDER — BUDESONIDE 0.5 MG/2ML IN SUSP: RESPIRATORY_TRACT | 1 refills | Status: DC

## 2016-08-31 NOTE — Assessment & Plan Note (Addendum)
   For now, continue Flovent 110 g, 2 inhalations via spacer device twice a day, and albuterol HFA every 4-6 hours as needed.  Based upon expense of prescription medications, discontinue montelukast.   If lower respiratory symptoms progress in frequency and/or severity, resume the previous dose.  Subjective and objective measures of pulmonary function will be followed and the treatment plan will be adjusted accordingly.

## 2016-08-31 NOTE — Assessment & Plan Note (Signed)
   Continue appropriate allergen avoidance measures.  Start nasal steroid rinse (as above).  For thick post nasal drainage, add guaifenesin 600 mg (Mucinex)  twice daily as needed with adequate hydration as discussed.

## 2016-08-31 NOTE — Assessment & Plan Note (Signed)
The history and physical examination suggest large local reaction secondary to insect bite/sting.  Continue diphenhydramine as needed.  If needed, apply hydrocortisone 1% cream sparingly to affected area with care to avoid the eyes.

## 2016-08-31 NOTE — Assessment & Plan Note (Addendum)
   Start budesonide/saline nasal irrigation twice a day.  A prescription has been provided for budesonide 0.5 mg respules and instructions for mixing and adminstering the rinse have been discussed and provided in written form.  If the budesonide ampules are more expensive than the previous treatment plan, then resume Qnasl and we will assist with samples as they become available.

## 2016-08-31 NOTE — Progress Notes (Signed)
Follow-up Note  RE: Amanda Jackson MRN: [id number] DOB: Jan 19, 1941 Date of Office Visit: 08/31/2016  Primary care provider: Midge Minium, MD Referring provider: Midge Minium, MD  History of present illness: Amanda Jackson is a 76 y.o. female with persistent asthma, allergic rhinitis, gastroesophageal reflux, and history of nasal polyp, presenting today for a new problem.  She was last seen in this clinic on 05/31/2016.  She reports that on Saturday evening she was working in the yard and noticed a small swollen area with a central red mark on her right cheekbone.  She did not feel pain and the area has been mildly pruritic over the past few days.  She has not experienced other cutaneous symptoms or concomitant cardiopulmonary or GI symptoms.  She reports that with the elevated pollen counts she has been experiencing some increased postnasal drainage and thick mucus in the chest which has triggered coughing.  She does not complain of significant chest tightness or wheezing.  Based upon changes in insurance coverage, she was switched from Qvar 80 g to Flovent 110 g.   Assessment and plan: Insect bite The history and physical examination suggest large local reaction secondary to insect bite/sting.  Continue diphenhydramine as needed.  If needed, apply hydrocortisone 1% cream sparingly to affected area with care to avoid the eyes.  History of nasal polyps  Start budesonide/saline nasal irrigation twice a day.  A prescription has been provided for budesonide 0.5 mg respules and instructions for mixing and adminstering the rinse have been discussed and provided in written form.  If the budesonide ampules are more expensive than the previous treatment plan, then resume Qnasl and we will assist with samples as they become available.  Mild persistent asthma  For now, continue Flovent 110 g, 2 inhalations via spacer device twice a day, and albuterol HFA every 4-6 hours as  needed.  Based upon expense of prescription medications, discontinue montelukast.   If lower respiratory symptoms progress in frequency and/or severity, resume the previous dose.  Subjective and objective measures of pulmonary function will be followed and the treatment plan will be adjusted accordingly.  Allergic rhinitis  Continue appropriate allergen avoidance measures.  Start nasal steroid rinse (as above).  For thick post nasal drainage, add guaifenesin 600 mg (Mucinex)  twice daily as needed with adequate hydration as discussed.   Meds ordered this encounter  Medications  . budesonide (PULMICORT) 0.5 MG/2ML nebulizer solution    Sig: Mix budesonide/saline nasal irrigation twice a day.    Dispense:  360 mL    Refill:  1    90 day supply.    Diagnostics: Spirometry reveals an FVC of 1.57 L (C 79% predicted) and an FEV1 of 1.14 L (74% predicted).  Mild restrictive pattern consistent with previous studies. This study was performed while the patient was asymptomatic.  Please see scanned spirometry results for details.    Physical examination: Blood pressure 94/60, pulse 72, temperature 98.2 F (36.8 C), temperature source Oral, resp. rate 14, height 5' 0.25" (1.53 m), weight 116 lb 6.4 oz (52.8 kg), SpO2 97 %.  General: Alert, interactive, in no acute distress. HEENT: TMs pearly gray, turbinates minimally edematous without discharge, post-pharynx moderately erythematous. Neck: Supple without lymphadenopathy. Lungs: Clear to auscultation without wheezing, rhonchi or rales. CV: Normal S1, S2 without murmurs. Skin: Warm and dry, 3cm x 3cm indurated lesion with central mildly erythematous puncta over right zygomatic.  The following portions of the patient's history were reviewed and  updated as appropriate: allergies, current medications, past family history, past medical history, past social history, past surgical history and problem list.  Allergies as of 08/31/2016   No Known  Allergies     Medication List       Accurate as of 08/31/16  1:10 PM. Always use your most recent med list.          amLODipine 10 MG tablet Commonly known as:  NORVASC Take 1 tablet (10 mg total) by mouth 2 (two) times daily. *Please call and schedule an appointment*   aspirin 81 MG EC tablet Take 81 mg by mouth daily.   Beclomethasone Dipropionate 80 MCG/ACT Aers Commonly known as:  QNASL ONE SPRAY EACH NOSTRIL TWICE DAILY FOR STUFFY NOSE OR DRAINAGE.   budesonide 0.5 MG/2ML nebulizer solution Commonly known as:  PULMICORT Mix budesonide/saline nasal irrigation twice a day.   CALCIUM 600+D3 PO Take 600 mg by mouth 2 (two) times daily.   Carbinoxamine Maleate ER 4 MG/5ML Suer Commonly known as:  KARBINAL ER Take 8-10 mg by mouth 2 (two) times daily.   carvedilol 12.5 MG tablet Commonly known as:  COREG TAKE ONE TABLET BY MOUTH TWICE DAILY WITH A MEAL   CENTRUM tablet Take 1 tablet by mouth daily.   conjugated estrogens vaginal cream Commonly known as:  PREMARIN Place 0.5 g vaginally 2 (two) times a week. As needed for spotting   fluticasone 110 MCG/ACT inhaler Commonly known as:  FLOVENT HFA Inhale 2 puffs into the lungs 2 (two) times daily.   FOLBIC 2.5-25-2 MG Tabs tablet Generic drug:  folic acid-pyridoxine-cyancobalamin TAKE 1 TABLET BY MOUTH DAILY   ibuprofen 800 MG tablet Commonly known as:  ADVIL,MOTRIN TAKE 1 TABLET (800 MG TOTAL) BY MOUTH 3 (THREE) TIMES DAILY AS NEEDED FOR PAIN   LORazepam 1 MG tablet Commonly known as:  ATIVAN TAKE 1 TABLET BY MOUTH TWICE A DAY   losartan 50 MG tablet Commonly known as:  COZAAR Take 1 tablet (50 mg total) by mouth daily.   montelukast 10 MG tablet Commonly known as:  SINGULAIR TAKE 1 TABLET BY MOUTH DAILY   nitroGLYCERIN 0.4 MG SL tablet Commonly known as:  NITROSTAT AS DIRECTED   omeprazole 20 MG capsule Commonly known as:  PRILOSEC TAKE 1 CAPSULE (20 MG TOTAL) BY MOUTH 2 (TWO) TIMES DAILY.     potassium chloride SA 20 MEQ tablet Commonly known as:  K-DUR,KLOR-CON Take 1 tablet (20 mEq total) by mouth daily.   PROAIR HFA 108 (90 Base) MCG/ACT inhaler Generic drug:  albuterol INHALE 2 PUFFS BY MOUTH EVERY 4 HOURS AS NEEDED FOR COUGH OR WHEEZE   QVAR 80 MCG/ACT inhaler Generic drug:  beclomethasone TAKE TWO PUFFS TWICE DAILY TO PREVENT COUGH OR WHEEZE. RINSE,GARGLE AND SPIT. USE WITH SPACER.   spironolactone 25 MG tablet Commonly known as:  ALDACTONE Take 1 tablet (25 mg total) by mouth daily.   traMADol 50 MG tablet Commonly known as:  ULTRAM Take 50 mg by mouth every 6 (six) hours as needed. Reported on 07/23/2015   TYLENOL ARTHRITIS PAIN 650 MG CR tablet Generic drug:  acetaminophen Take 650 mg by mouth every 8 (eight) hours as needed for pain.       No Known Allergies  Review of systems: Review of systems negative except as noted in HPI / PMHx or noted below: Constitutional: Negative.  HENT: Negative.   Eyes: Negative.  Respiratory: Negative.   Cardiovascular: Negative.  Gastrointestinal: Negative.  Genitourinary: Negative.  Musculoskeletal:  Negative.  Neurological: Negative.  Endo/Heme/Allergies: Negative.  Cutaneous: Negative.  Past Medical History:  Diagnosis Date  . Allergy   . Anxiety   . Arthritis   . Asthma   . Atypical chest pain    Normal Myoview 2008  . Colon polyp   . GERD (gastroesophageal reflux disease)   . Hx of colonoscopy 03/18/09  . Hyperlipidemia   . Hypertension   . Left nasal polyps   . MVP (mitral valve prolapse)   . Seasonal allergies   . Tubulovillous adenoma     Family History  Problem Relation Age of Onset  . Hypertension Son   . Lung cancer Father   . Cancer Father   . Hypertension Father   . Liver cancer Mother   . Cancer Mother   . Hypertension Mother   . Colon cancer Mother   . Colon cancer Sister   . Heart murmur Son   . Stomach cancer Neg Hx   . Ulcerative colitis Neg Hx   . Allergic rhinitis Neg  Hx   . Angioedema Neg Hx   . Asthma Neg Hx     Social History   Social History  . Marital status: Divorced    Spouse name: N/A  . Number of children: N/A  . Years of education: N/A   Occupational History  . 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 Smoking status: Former Smoker    Quit date: 09/09/1977  . Smokeless tobacco: Never Used     Comment: used to smoke socially. Quit in 1979.  Marland Kitchen Alcohol use Yes     Comment: usually has a glass of wine with dinner every evening  . Drug use: No  . Sexual activity: Not Currently    Birth control/ protection: Other-see comments     Comment: HYST   Other Topics Concern  . Not on file   Social History Narrative  . No narrative on file    I appreciate the opportunity to take part in Amanda Jackson's care. Please do not hesitate to contact me with questions.  Sincerely,   R. Edgar Frisk, MD

## 2016-08-31 NOTE — Patient Instructions (Addendum)
Insect bite The history and physical examination suggest large local reaction secondary to insect bite/sting.  Continue diphenhydramine as needed.  If needed, apply hydrocortisone 1% cream sparingly to affected area with care to avoid the eyes.  History of nasal polyps  Start budesonide/saline nasal irrigation twice a day.  A prescription has been provided for budesonide 0.5 mg respules and instructions for mixing and adminstering the rinse have been discussed and provided in written form.  If the budesonide ampules are more expensive than the previous treatment plan, then resume Qnasl and we will assist with samples as they become available.  Mild persistent asthma  For now, continue Flovent 110 g, 2 inhalations via spacer device twice a day, and albuterol HFA every 4-6 hours as needed.  Based upon expense of prescription medications, discontinue montelukast.   If lower respiratory symptoms progress in frequency and/or severity, resume the previous dose.  Subjective and objective measures of pulmonary function will be followed and the treatment plan will be adjusted accordingly.  Allergic rhinitis  Continue appropriate allergen avoidance measures.  Start nasal steroid rinse (as above).  For thick post nasal drainage, add guaifenesin 600 mg (Mucinex)  twice daily as needed with adequate hydration as discussed.   Return in about 6 months (around 03/03/2017), or if symptoms worsen or fail to improve.  Budesonide (Pulmicort) + Saline Irrigation/Rinse  Budesonide (Pulmicort) is an anti-inflammatory steroid medication used to decrease nasal and sinus inflammation. It is dispensed in liquid form in a vial. Although it is manufactured for use with a nebulizer, we intend for you to use it with the NeilMed Sinus Rinse bottle (preferred) or a Neti pot.   Instructions:  1) Make 240cc of saline in the NeilMed bottle using the salt packets or your own saline recipe (see separate handout).   2) Add the entire 2cc vial of liquid Budesonide (Pulmicort) to the rinse bottle and mix together.  3) While in the shower or over the sink, tilt your head forward to a comfortable level. Put the tip of the sinus rinse bottle in your nostril and aim it towards the crown or top of your head. Gently squeeze the bottle to flush out your nose. The fluid will circulate in and out of your sinus cavities, coming back out from either nostril or through your mouth. Try not to swallow large quantities and spit it out instead.  4) Perform Budesonide (Pulmicort) + Saline irrigations 2 times daily.

## 2016-09-07 ENCOUNTER — Other Ambulatory Visit: Payer: Self-pay | Admitting: Cardiology

## 2016-09-07 DIAGNOSIS — I10 Essential (primary) hypertension: Secondary | ICD-10-CM

## 2016-09-07 DIAGNOSIS — E876 Hypokalemia: Secondary | ICD-10-CM

## 2016-09-27 ENCOUNTER — Ambulatory Visit: Payer: Medicare Other | Admitting: Allergy and Immunology

## 2016-10-05 ENCOUNTER — Other Ambulatory Visit: Payer: Self-pay | Admitting: Cardiology

## 2016-10-05 ENCOUNTER — Other Ambulatory Visit: Payer: Self-pay | Admitting: Family Medicine

## 2016-10-05 ENCOUNTER — Other Ambulatory Visit: Payer: Self-pay | Admitting: Allergy and Immunology

## 2016-10-05 DIAGNOSIS — I10 Essential (primary) hypertension: Secondary | ICD-10-CM

## 2016-10-05 DIAGNOSIS — E876 Hypokalemia: Secondary | ICD-10-CM

## 2016-10-06 NOTE — Telephone Encounter (Signed)
Medication filled to pharmacy as requested.   

## 2016-10-06 NOTE — Telephone Encounter (Signed)
Last OV 08/31/16 Lorazepam last filled 08/11/16 #60 with 1

## 2016-10-10 ENCOUNTER — Other Ambulatory Visit: Payer: Self-pay | Admitting: Cardiology

## 2016-10-26 ENCOUNTER — Telehealth: Payer: Self-pay | Admitting: Cardiology

## 2016-10-26 ENCOUNTER — Telehealth: Payer: Self-pay | Admitting: Family Medicine

## 2016-10-26 ENCOUNTER — Other Ambulatory Visit (INDEPENDENT_AMBULATORY_CARE_PROVIDER_SITE_OTHER): Payer: Medicare Other

## 2016-10-26 DIAGNOSIS — R252 Cramp and spasm: Secondary | ICD-10-CM | POA: Diagnosis not present

## 2016-10-26 LAB — BASIC METABOLIC PANEL
BUN: 22 mg/dL (ref 6–23)
CHLORIDE: 104 meq/L (ref 96–112)
CO2: 24 meq/L (ref 19–32)
CREATININE: 0.76 mg/dL (ref 0.40–1.20)
Calcium: 9.7 mg/dL (ref 8.4–10.5)
GFR: 95.25 mL/min (ref >60.00)
Glucose, Bld: 106 mg/dL — ABNORMAL HIGH (ref 70–99)
Potassium: 4.2 mEq/L (ref 3.5–5.1)
Sodium: 134 mEq/L — ABNORMAL LOW (ref 135–145)

## 2016-10-26 NOTE — Telephone Encounter (Signed)
Pt made aware and she is going to [facility] office to have these drawn.

## 2016-10-26 NOTE — Telephone Encounter (Signed)
Pt obtained an appt with her PCP today for appropriate lab testing, for complaints mentioned.

## 2016-10-26 NOTE — Telephone Encounter (Signed)
Notes recorded by Brunetta Jeans, PA-C on 10/26/2016 at 4:11 PM EDT Potassium level is normal on check. Should not be contributing to current symptoms. Make sure she is hydrating well. Can consider a small G2 gatorade each day. If not improving she needs appointment for assessment.   Pts BMET results, per PCP office.  Results are as mentioned above.  Pts K level was normal and was advised to increase PO fluids and schedule an appt with PCP, if symptoms of cramping continue.  Pt was contacted by PCP about this.  Will forward to Dr Meda Coffee as a general [place].

## 2016-10-26 NOTE — Telephone Encounter (Signed)
Pt calling for advice on how she can increase her K level.  Pt states that over the course of 2 weeks, she has been experiencing sporadic cramping in her feet, legs, and hand.  Pt states that in the summer months, she tends to have lower K level, despite being on K-dur and other potassium containing meds, daily.  Pt states she has a known history of hypokalemia.  Pt states that she thinks her K level is lower, due to symptoms of cramping, and wants to know if its ok to take extra tablets of her K-Dur?   Educated the pt on how she can safely increase the K in her diet, to aid in relieving her cramping. Advised the pt that she should never increase her K-Dur without appropriate testing, like a BMET done.  Informed the pt that accurately being able to assess her K level for possible med titration, is done by ordering and obtaining labs, and then advising thereafter.  Informed the pt that she not only is taking K-Dur on a daily basis, she is also taking potassium containing meds, which should closely be monitored for adjustment, through lab collection. Advised the pt that in the meantime, she can increase the K in her diet.  Different food options given to the pt, that don't interact with her meds taking.  Advised the pt that in the summer months, she really needs to be mindful of staying plenty hydrated.  Informed the pt that Dr Meda Coffee is currently out of the office x 2 weeks, but advised her that in her absence, she should call her PCP Dr. Birdie Riddle, to request an order for a BMET to be drawn, for evaluation of potassium level, and possible med adjustment.  Informed the pt that I will still route this message to Dr Meda Coffee as a general FYI, and to review pts lab, if ordered by PCP.  Pt verbalized understanding and agrees with this plan.  Pt gracious for all the time spent, and all the assistance provided.

## 2016-10-26 NOTE — Telephone Encounter (Signed)
Pt had lab done at her PCP office, as addressed by Raiford Noble PA with Dr Birdie Riddle.  Pt had a BMET done.  Will follow accordingly, once PCP reviews and results.

## 2016-10-26 NOTE — Telephone Encounter (Signed)
Pt calling stating that Dr, Meda Coffee is out of the office and that she was told by them to contact her pcp to have her potassium checked due to having leg cramps . When I advised her that KT was also out of the office and that I was not sure if an order for this could be placed without an office visit first. Pt states that she feels like she is getting a run away with Dr. Francesca Oman office and getting these labs ordered. Please advise

## 2016-10-26 NOTE — Telephone Encounter (Signed)
New message      Pt c/o medication issue:  1. Name of Medication:  potassium 2. How are you currently taking this medication (dosage and times per day)? 20 meq daily  3. Are you having a reaction (difficulty breathing--STAT)? no  4. What is your medication issue? Pt is having cramps.  Should she take more medication?

## 2016-10-26 NOTE — Telephone Encounter (Signed)
She is welcome to come in for a BMP to recheck her potassium. I will review in Dr. Meda Coffee and Dr. Virgil Benedict absence and we can determine next steps.   Hope this helps.

## 2016-10-26 NOTE — Telephone Encounter (Signed)
Please advise? Pt last seen 06/01/16 by PCP potassium at that time was normal. Ok to have repeat labs or would pt need to be seen first?

## 2016-10-26 NOTE — Telephone Encounter (Signed)
Follow Up:    She wanted to thank you for helping her to get an appointment today for her lab work.She really wanted you to know how much she appreciated you and your help.

## 2016-10-26 NOTE — Telephone Encounter (Signed)
Follow up      Calling to let the nurse know that Dr Tabori---pcp---is out of the office this week.  Can she come here for labs to be drawn?  Please call

## 2016-11-01 ENCOUNTER — Other Ambulatory Visit: Payer: Self-pay | Admitting: Cardiology

## 2016-11-01 DIAGNOSIS — E876 Hypokalemia: Secondary | ICD-10-CM

## 2016-11-01 DIAGNOSIS — I10 Essential (primary) hypertension: Secondary | ICD-10-CM

## 2016-11-10 ENCOUNTER — Other Ambulatory Visit: Payer: Self-pay | Admitting: Cardiology

## 2016-11-13 ENCOUNTER — Other Ambulatory Visit: Payer: Self-pay | Admitting: Cardiology

## 2016-11-16 ENCOUNTER — Other Ambulatory Visit: Payer: Self-pay | Admitting: Cardiology

## 2016-11-16 DIAGNOSIS — E876 Hypokalemia: Secondary | ICD-10-CM

## 2016-11-16 DIAGNOSIS — I10 Essential (primary) hypertension: Secondary | ICD-10-CM

## 2016-12-04 ENCOUNTER — Other Ambulatory Visit: Payer: Self-pay | Admitting: Cardiology

## 2016-12-07 ENCOUNTER — Other Ambulatory Visit: Payer: Self-pay | Admitting: Cardiology

## 2016-12-18 ENCOUNTER — Other Ambulatory Visit: Payer: Self-pay | Admitting: Family Medicine

## 2016-12-24 ENCOUNTER — Telehealth: Payer: Self-pay | Admitting: Family Medicine

## 2016-12-24 DIAGNOSIS — L989 Disorder of the skin and subcutaneous tissue, unspecified: Secondary | ICD-10-CM

## 2016-12-24 NOTE — Telephone Encounter (Signed)
Referral placed.

## 2016-12-24 NOTE — Telephone Encounter (Signed)
Patient states she has a lesion on her back she is concerned about.  She wants to know if pcp would refer her to a dermatologist for evaluation or if Dr. Birdie Riddle wants to see her in office.

## 2016-12-28 ENCOUNTER — Other Ambulatory Visit: Payer: Self-pay | Admitting: Family Medicine

## 2017-01-04 ENCOUNTER — Other Ambulatory Visit: Payer: Self-pay | Admitting: Family Medicine

## 2017-01-05 NOTE — Telephone Encounter (Signed)
Last OV 06/01/16 Lorazepam last filled 10/06/16 #60 with 1

## 2017-01-05 NOTE — Telephone Encounter (Signed)
Medication filled to pharmacy as requested.   

## 2017-01-11 ENCOUNTER — Telehealth: Payer: Self-pay | Admitting: Cardiology

## 2017-01-11 ENCOUNTER — Encounter: Payer: Self-pay | Admitting: Cardiology

## 2017-01-11 ENCOUNTER — Ambulatory Visit (INDEPENDENT_AMBULATORY_CARE_PROVIDER_SITE_OTHER): Payer: Medicare Other | Admitting: Cardiology

## 2017-01-11 VITALS — BP 102/54 | HR 64 | Ht 60.25 in | Wt 115.0 lb

## 2017-01-11 DIAGNOSIS — I119 Hypertensive heart disease without heart failure: Secondary | ICD-10-CM

## 2017-01-11 DIAGNOSIS — R55 Syncope and collapse: Secondary | ICD-10-CM

## 2017-01-11 DIAGNOSIS — G43909 Migraine, unspecified, not intractable, without status migrainosus: Secondary | ICD-10-CM | POA: Insufficient documentation

## 2017-01-11 DIAGNOSIS — R079 Chest pain, unspecified: Secondary | ICD-10-CM

## 2017-01-11 NOTE — Patient Instructions (Signed)

## 2017-01-11 NOTE — Progress Notes (Signed)
Patient ID: Amanda Jackson, female   DOB: 11/02/1940, 76 y.o.   MRN: [id number]    HPI: Amanda Jackson is a delightful 76 year old woman history of hypertension and atypical chest pain with a normal Myoview in 2008.  She also has seasonal allergies.  She presents today for routine followup.  She is doing very well.  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 She denies any shortness of breath, she experiences only occasional sharp chest pains that are relieved by walking around and taking sublingual nitroglycerin. She states that she experiences those mostly with emotional stress and when she relieves the tension the pain resolves. These episodes are rare and they are same in character as when she had her stress test that was negative for ischemia. 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 She is very compliant with her meds.   08/22/2015 - the patient is coming after one year, she is feeling great she occasionally gets chest pain with emotional distress, on one occasion approximately one month ago she became emotionally stress talking to her son about his problems, he took nitroglycerin for chest pain and Ativan for anxiety and when she stood up and walk passed out. He called EMS concluded she was hypotensive. No other episodes of syncope. She stays active, she is fully independent, she takes significant amount of potassium pill for her hypokalemia. She otherwise denies orthopnea, paroxysmal nocturnal dyspnea, she has occasional lower extremity edema, and denies any palpitations.   10/15/2015 - patient is coming after 6 weeks, at the last visit we change her blood pressure medication and added spironolactone as she was on hypokalemic, her repeat labs showed normal potassium and normal creatinine. She feels  well, she denies any shortness of breath or chest pain no lower extremity edema orthopnea or proximal nocturnal dyspnea. She is complaining of easy bruising.  01/11/2017 - the patient is coming after one year, she looks great, she has lost 12 pounds unintentionally and was able to gain 5 pounds back. She has noticed mild worsening exertional chest pain but only on severe exertion and when she is tired approximately once a week. She denies any dizziness no all nor syncope. No lower extremity edema no claudications, she hasn't noticed any blood in her stool or black tarry stools. She complains that she is bruising easily.   Lab Results  Component Value Date   CHOL 148 12/01/2015   HDL 66.40 12/01/2015   LDLCALC 69 12/01/2015   TRIG 63.0 12/01/2015   CHOLHDL 2 12/01/2015   ROS: All systems negative except as listed in HPI, PMH and Problem List.  Past Medical History:  Diagnosis Date  . Allergy   . Anxiety   . Arthritis   . Asthma   . Atypical chest pain    Normal Myoview 2008  . Colon polyp   . GERD (gastroesophageal reflux disease)   . Hx of colonoscopy 03/18/09  . Hyperlipidemia   . Hypertension   . Left nasal polyps   . MVP (mitral valve prolapse)   . Seasonal allergies   . Tubulovillous adenoma     Current Outpatient Prescriptions  Medication Sig Dispense Refill  . acetaminophen (TYLENOL ARTHRITIS PAIN) 650 MG CR tablet Take 650 mg by mouth every 8 (eight)  hours as needed for pain.    Marland Kitchen amLODipine (NORVASC) 10 MG tablet TAKE 1 TABLET BY MOUTH 2 TIMES DAILY. *PLEASE CALL AND SCHEDULE AN APPOINTMENT* 60 tablet 5  . amoxicillin-clavulanate (AUGMENTIN) 875-125 MG tablet as directed.    Marland Kitchen aspirin 81 MG EC tablet Take 81 mg by mouth daily.      Marland Kitchen azithromycin (ZITHROMAX) 500 MG tablet as directed.    . Beclomethasone Dipropionate (QNASL) 80 MCG/ACT AERS ONE SPRAY EACH NOSTRIL TWICE DAILY FOR STUFFY NOSE OR DRAINAGE. 1 Inhaler 4  . budesonide (PULMICORT) 0.5 MG/2ML nebulizer  solution Mix budesonide/saline nasal irrigation twice a day. 360 mL 1  . Calcium Carb-Cholecalciferol (CALCIUM 600+D3 PO) Take 600 mg by mouth 2 (two) times daily.     . Carbinoxamine Maleate ER [facility] ER) 4 MG/5ML SUER Take 8-10 mg by mouth 2 (two) times daily. 120 mL 3  . carvedilol (COREG) 12.5 MG tablet TAKE ONE TABLET BY MOUTH TWICE DAILY WITH A MEAL 60 tablet 6  . conjugated estrogens (PREMARIN) vaginal cream Place 0.5 g vaginally 2 (two) times a week. As needed for spotting    . fluticasone (FLOVENT HFA) 110 MCG/ACT inhaler Inhale 2 puffs into the lungs 2 (two) times daily. 1 Inhaler 5  . FOLBIC 2.5-25-2 MG TABS tablet TAKE 1 TABLET BY MOUTH DAILY 30 tablet 4  . ibuprofen (ADVIL,MOTRIN) 800 MG tablet TAKE 1 TABLET (800 MG TOTAL) BY MOUTH 3 (THREE) TIMES DAILY AS NEEDED FOR PAIN 90 tablet 1  . KLOR-CON M20 20 MEQ tablet TAKE ONE TABLET BY MOUTH DAILY 90 tablet 0  . LORazepam (ATIVAN) 1 MG tablet TAKE 1 TABLET BY MOUTH TWICE A DAY 60 tablet 1  . losartan (COZAAR) 50 MG tablet TAKE 1 TABLET (50 MG TOTAL) BY MOUTH DAILY. 90 tablet 0  . montelukast (SINGULAIR) 10 MG tablet TAKE 1 TABLET BY MOUTH DAILY 30 tablet 4  . Multiple Vitamins-Minerals (CENTRUM) tablet Take 1 tablet by mouth daily.      . nitroGLYCERIN (NITROSTAT) 0.4 MG SL tablet AS DIRECTED (Patient taking differently: Place 0.4 mg under the tongue. For chest pain (patient unsure of MAX doses allowed)) 25 tablet 0  . omeprazole (PRILOSEC) 20 MG capsule TAKE 1 CAPSULE (20 MG TOTAL) BY MOUTH 2 (TWO) TIMES DAILY. (Patient taking differently: Take 20 mg by mouth daily. TAKE 1 CAPSULE (20 MG TOTAL) BY MOUTH 2 (TWO) TIMES DAILY.) 180 capsule 1  . PROAIR HFA 108 (90 Base) MCG/ACT inhaler INHALE 2 PUFFS BY MOUTH EVERY 4 HOURS AS NEEDED FOR COUGH OR WHEEZE 8.5 Inhaler 0  . QVAR 80 MCG/ACT inhaler TAKE TWO PUFFS TWICE DAILY TO PREVENT COUGH OR WHEEZE. RINSE,GARGLE AND SPIT. USE WITH SPACER. 8.7 g 4  . spironolactone (ALDACTONE) 25 MG tablet TAKE  1 TABLET (25 MG TOTAL) BY MOUTH DAILY. PLEASE KEEP 01/11/17 APPT FOR FURHTER REFILLS 30 tablet 2  . traMADol (ULTRAM) 50 MG tablet Take 50 mg by mouth every 6 (six) hours as needed. Reported on 07/23/2015  0   No current facility-administered medications for this visit.     PHYSICAL EXAM: Vitals:   01/11/17 0851  BP: (!) 102/54  Pulse: 64   General:  Well appearing. No resp difficulty HEENT: normal Neck: supple. JVP flat. Carotids 2+ bilaterally; no bruits. No lymphadenopathy or thryomegaly appreciated. Cor: PMI normal. Regular rate & rhythm. No rubs, gallops or murmurs. Lungs: clear Abdomen: soft, nontender, nondistended. No hepatosplenomegaly. No bruits or masses. Good bowel sounds. Extremities: no cyanosis, clubbing,  rash, edema. Left knee swelling  Neuro: alert & orientedx3, cranial nerves grossly intact. Moves all 4 extremities w/o difficulty. Affect pleasant.  ECG: Performed today 01/11/2017 shows sinus rhythm normal EKG unchanged from prior, this was personally reviewed.   ASSESSMENT & PLAN:  1.   Hypertensive heart disease without heart failure - the blood pressure too low for her age with an episode of syncope after taking 1 pill of nitroglycerin, her blood pressure is well controlled on current regimen and she has no side effects.  2. Exertional chest pain - her EKG is completely normal - she states that her symptoms are only about once a week on severe exertion when she is tired.  We will hold off on ischemic workup right now, I will see her back in 6 months or if she call us with worsening symptoms at that time he would consider coronary CTA with CT FFR.   3. Unintentional weight loss, she is advised to follow with her primary care physician for FOBT and potentially early colonoscopy. Together with easy bruising she is advised to hold baby aspirin.  4. Lipids - completely normal in 2016.   Follow up in 6 months.  Ena Dawley 01/11/2017

## 2017-01-11 NOTE — Telephone Encounter (Signed)
Pt calling to ask if she will need another referral to [facility] to be seen for her routine colonoscopy, being Dr Olevia Perches was her previous GI MD, and she is retired.  Informed the pt that she should still be an established pt with [facility], being she was under Dr Nichola Sizer care.  Informed the pt that she should call them tomorrow and request for a follow-up appt with whomever Dr Olevia Perches assigned her too.  Advised the pt that if they need a new referral to their office, because its been greater than 2 years, then she should call our office, and request for either myself or Dr Meda Coffee to place a new referral in the system. Pt verbalized understanding and agrees with this plan.

## 2017-01-11 NOTE — Telephone Encounter (Signed)
New Message  Pt call requesting to speak with RN about getting Dr. Meda Coffee RN to call Dr. Doyne Keel office to getting her an appt. Pt states she was seen today by Dr. Meda Coffee. Please call back to discuss

## 2017-01-13 ENCOUNTER — Telehealth: Payer: Self-pay | Admitting: Allergy and Immunology

## 2017-01-13 MED ORDER — BECLOMETHASONE DIPROPIONATE 80 MCG/ACT NA AERS: INHALATION_SPRAY | NASAL | 4 refills | Status: DC

## 2017-01-13 NOTE — Telephone Encounter (Signed)
Pt called and needs to have Qnasl called into [facility]. [phone]. And needs Qvar samples too. [phone].

## 2017-01-13 NOTE — Telephone Encounter (Signed)
I spoke with patient today and advised on the Qnasl needing a prior authorization. I also advised her that the Flovent inhaler was replacing the Qvar inhaler. I am submitting the PA today for the Qnasl.

## 2017-01-14 NOTE — Telephone Encounter (Signed)
Qnasl prior authorization has been approved. The approval notice should go to the [place] office. Patient has not been made aware yet.

## 2017-01-14 NOTE — Telephone Encounter (Signed)
Patient has been informed of the approval.

## 2017-01-17 ENCOUNTER — Other Ambulatory Visit: Payer: Self-pay | Admitting: Allergy and Immunology

## 2017-01-17 MED ORDER — FLUTICASONE PROPIONATE HFA 110 MCG/ACT IN AERO: 2.0000 | INHALATION_SPRAY | Freq: Two times a day (BID) | RESPIRATORY_TRACT | 1 refills | Status: DC

## 2017-01-24 ENCOUNTER — Other Ambulatory Visit: Payer: Self-pay | Admitting: Allergy and Immunology

## 2017-01-27 ENCOUNTER — Ambulatory Visit: Payer: Medicare Other

## 2017-01-27 ENCOUNTER — Ambulatory Visit: Payer: Medicare Other | Admitting: Family Medicine

## 2017-02-02 NOTE — Progress Notes (Signed)
Subjective:   Amanda Jackson is a 76 y.o. female who presents for Medicare Annual (Subsequent) preventive examination.  Review of Systems:  No ROS.  Medicare Wellness Visit. Additional risk factors are reflected in the social history.  Cardiac Risk Factors include: advanced age (>22men, >27 women);hypertension;dyslipidemia;family history of premature cardiovascular disease   Sleep patterns: Sleeps 7 hours.  Home Safety/Smoke Alarms: Feels safe in home. Smoke alarms in place.  Living environment; residence and Firearm Safety: Lives with companion in 2 story home.  Seat Belt Safety/Bike Helmet: Wears seat belt.   Female:   Pap-N/A. Followed by GYN      Mammo-07/01/16, negative.        Dexa scan-11/11/2014, Osteopenia. Order placed [facility]).      [facility]-Colonoscopy 03/27/2014, polyp. FHx of colon cancer. Appt scheduled 03/2017.      Objective:     Vitals: BP (!) 100/50 (BP Location: Left Arm, Patient Position: Sitting, Cuff Size: Normal)   Pulse 70   Temp 97.7 F (36.5 C) (Temporal)   Resp 18   Ht 5' (1.524 m)   Wt 118 lb 6.4 oz (53.7 kg)   SpO2 98%   BMI 23.12 kg/m   Body mass index is 23.12 kg/m.   Tobacco History  Smoking Status  . Former Smoker  . Quit date: 09/09/1977  Smokeless Tobacco  . Never Used    Comment: used to smoke socially. Quit in 1979.     Counseling given: Not Answered   Past Medical History:  Diagnosis Date  . Allergy   . Anxiety   . Arthritis   . Asthma   . Atypical chest pain    Normal Myoview 2008  . Colon polyp   . GERD (gastroesophageal reflux disease)   . Hx of colonoscopy 03/18/09  . Hyperlipidemia   . Hypertension   . Left nasal polyps   . MVP (mitral valve prolapse)   . Seasonal allergies   . Tubulovillous adenoma    Past Surgical History:  Procedure Laterality Date  . ABDOMINAL HYSTERECTOMY    . BREAST SURGERY  2010   biopsy-benign  . BUNIONECTOMY    . COLONOSCOPY    . FUNCTIONAL ENDOSCOPIC SINUS SURGERY  08/28/08   with bilateral ethmoidectomies and bilateral maxillary sinus ostial enlargement with removal of mucocyst, mucous membrane, and mucoid material  . KNEE ARTHROSCOPY W/ LATERAL RELEASE  11/17/99   Left  . KNEE ARTHROSCOPY W/ PARTIAL MEDIAL MENISCECTOMY  11/17/99   left  . Laparoscopic Right Colectomy  01/25/03  . nasal polyp removal    . POLYPECTOMY    . TONSILLECTOMY  1952   Family History  Problem Relation Age of Onset  . Hypertension Son   . Lung cancer Father   . Cancer Father   . Hypertension Father   . Liver cancer Mother   . Cancer Mother   . Hypertension Mother   . Colon cancer Mother   . Colon cancer Sister   . Heart murmur Son   . Stomach cancer Neg Hx   . Ulcerative colitis Neg Hx   . Allergic rhinitis Neg Hx   . Angioedema Neg Hx   . Asthma Neg Hx    History  Sexual Activity  . Sexual activity: Not Currently  . Birth control/ protection: Other-see comments    Comment: HYST    Outpatient Encounter Prescriptions as of 02/03/2017  Medication Sig  . acetaminophen (TYLENOL ARTHRITIS PAIN) 650 MG CR tablet Take 650 mg by mouth every 8 (  eight) hours as needed for pain.  Marland Kitchen albuterol (PROAIR HFA) 108 (90 Base) MCG/ACT inhaler INHALE TWO PUFFS EVERY 4 HOURS AS NEEDED FOR COUGH OR WHEEZE.  Marland Kitchen amLODipine (NORVASC) 10 MG tablet TAKE 1 TABLET BY MOUTH 2 TIMES DAILY. *PLEASE CALL AND SCHEDULE AN APPOINTMENT*  . Beclomethasone Dipropionate (QNASL) 80 MCG/ACT AERS ONE SPRAY EACH NOSTRIL TWICE DAILY FOR STUFFY NOSE OR DRAINAGE.  . budesonide (PULMICORT) 0.5 MG/2ML nebulizer solution Mix budesonide/saline nasal irrigation twice a day.  . Calcium Carb-Cholecalciferol (CALCIUM 600+D3 PO) Take 600 mg by mouth 2 (two) times daily.   . Carbinoxamine Maleate ER [facility] ER) 4 MG/5ML SUER Take 8-10 mg by mouth 2 (two) times daily.  . carvedilol (COREG) 12.5 MG tablet TAKE ONE TABLET BY MOUTH TWICE DAILY WITH A MEAL  . conjugated estrogens (PREMARIN) vaginal cream Place 0.5 g vaginally 2  (two) times a week. As needed for spotting  . Cyanocobalamin (VITAMIN B-12) 2500 MCG SUBL Place under the tongue.  . fluticasone (FLOVENT HFA) 110 MCG/ACT inhaler Inhale 2 puffs into the lungs 2 (two) times daily.  . FOLBIC 2.5-25-2 MG TABS tablet TAKE 1 TABLET BY MOUTH DAILY  . ibuprofen (ADVIL,MOTRIN) 800 MG tablet TAKE 1 TABLET (800 MG TOTAL) BY MOUTH 3 (THREE) TIMES DAILY AS NEEDED FOR PAIN  . KLOR-CON M20 20 MEQ tablet TAKE ONE TABLET BY MOUTH DAILY  . LORazepam (ATIVAN) 1 MG tablet TAKE 1 TABLET BY MOUTH TWICE A DAY  . losartan (COZAAR) 50 MG tablet TAKE 1 TABLET (50 MG TOTAL) BY MOUTH DAILY.  . montelukast (SINGULAIR) 10 MG tablet TAKE 1 TABLET BY MOUTH DAILY  . Multiple Vitamins-Minerals (CENTRUM) tablet Take 1 tablet by mouth daily.    . nitroGLYCERIN (NITROSTAT) 0.4 MG SL tablet AS DIRECTED (Patient taking differently: Place 0.4 mg under the tongue. For chest pain (patient unsure of MAX doses allowed))  . omeprazole (PRILOSEC) 20 MG capsule TAKE 1 CAPSULE (20 MG TOTAL) BY MOUTH 2 (TWO) TIMES DAILY. (Patient taking differently: Take 20 mg by mouth daily. TAKE 1 CAPSULE (20 MG TOTAL) BY MOUTH 2 (TWO) TIMES DAILY.)  . QVAR 80 MCG/ACT inhaler TAKE TWO PUFFS TWICE DAILY TO PREVENT COUGH OR WHEEZE. RINSE,GARGLE AND SPIT. USE WITH SPACER.  Marland Kitchen spironolactone (ALDACTONE) 25 MG tablet TAKE 1 TABLET (25 MG TOTAL) BY MOUTH DAILY. PLEASE KEEP 01/11/17 APPT FOR FURHTER REFILLS  . traMADol (ULTRAM) 50 MG tablet Take 50 mg by mouth every 6 (six) hours as needed. Reported on 07/23/2015  . amoxicillin-clavulanate (AUGMENTIN) 875-125 MG tablet as directed.  Marland Kitchen aspirin 81 MG EC tablet Take 81 mg by mouth daily.    Marland Kitchen azithromycin (ZITHROMAX) 500 MG tablet as directed.  . [DISCONTINUED] fluticasone (FLOVENT HFA) 110 MCG/ACT inhaler Inhale 2 puffs into the lungs 2 (two) times daily.   No facility-administered encounter medications on file as of 02/03/2017.     Activities of Daily Living In your present state  of health, do you have any difficulty performing the following activities: 02/03/2017 06/01/2016  Hearing? N N  Vision? N N  Difficulty concentrating or making decisions? N N  Walking or climbing stairs? Y N  Comment Right knee pain  -  Dressing or bathing? N N  Doing errands, shopping? N N  Preparing Food and eating ? N -  Using the Toilet? N -  In the past six months, have you accidently leaked urine? N -  Do you have problems with loss of bowel control? N -  Managing  your Medications? N -  Managing your Finances? N -  Housekeeping or managing your Housekeeping? N -  Some recent data might be hidden    Patient Care Team: Midge Minium, MD as PCP - General (Family Medicine) [name], MD as Consulting Physician (Cardiology) Thornell Sartorius, MD as Consulting Physician (Otolaryngology) Marica Otter, Fisher as Consulting Physician (Optometry) Bobbitt, Sedalia Muta, MD as Consulting Physician (Allergy and Immunology) Lafayette Dragon, MD (Inactive) as Consulting Physician (Gastroenterology) Leo Grosser Seymour Bars, MD as Consulting Physician (Obstetrics and Gynecology) Melrose Nakayama, MD as Consulting Physician (Orthopedic Surgery) Armbruster, Carlota Raspberry, MD as Consulting Physician (Gastroenterology) [place], Jennefer Bravo, MD as Referring Physician (Dermatology)    Assessment:    Physical assessment deferred to PCP.  Exercise Activities and Dietary recommendations Current Exercise Habits: The patient does not participate in regular exercise at present [facility]), Exercise limited by: orthopedic condition(s)   Diet (meal preparation, eat out, water intake, caffeinated beverages, dairy products, fruits and vegetables): Drinks water and gatorade, ensure every other day.   States she eats healthy well balanced meals.      Goals    . Increase physical activity          Increase activity by walking more.      Fall Risk Fall Risk  02/03/2017 06/01/2016 12/01/2015 05/22/2015  01/22/2015  Falls in the past year? Yes No Yes Yes Yes  Number falls in past yr: 1 - 1 1 1   Injury with Fall? No - No No No  Risk for fall due to : Impaired vision - - - -  Follow up Falls prevention discussed - - Falls prevention discussed -   Depression Screen PHQ 2/9 Scores 02/03/2017 06/01/2016 12/01/2015 05/22/2015  PHQ - 2 Score 0 0 0 0  PHQ- 9 Score - 0 - -  Exception Documentation - - - Patient refusal     Cognitive Function       Ad8 score reviewed for issues:  Issues making decisions: no  Less interest in hobbies / activities: no  Repeats questions, stories (family complaining): no  Trouble using ordinary gadgets (microwave, computer, phone): no  Forgets the month or year: no  Mismanaging finances: no  Remembering appts: no  Daily problems with thinking and/or memory: no Ad8 score is=0     Immunization History  Administered Date(s) Administered  . Influenza,inj,Quad PF,6+ Mos 02/01/2013, 05/06/2014, 03/12/2015, 03/03/2016  . Pneumococcal Conjugate-13 02/02/2012  . Pneumococcal Polysaccharide-23 05/08/2013  . Tdap 01/02/2003   Screening Tests Health Maintenance  Topic Date Due  . TETANUS/TDAP  01/01/2013  . DEXA SCAN  11/10/2016  . INFLUENZA VACCINE  11/24/2016  . MAMMOGRAM  07/01/2017  . COLONOSCOPY  03/28/2019  . PNA vac Low Risk Adult  Completed      Plan:     Schedule bone scan   Bring a copy of your living will and/or healthcare power of attorney to your next office visit.  Continue doing brain stimulating activities (puzzles, reading, adult coloring books, staying active) to keep memory sharp.     I have personally reviewed and noted the following in the patient's chart:   . Medical and social history . Use of alcohol, tobacco or illicit drugs  . Current medications and supplements . Functional ability and status . Nutritional status . Physical activity . Advanced directives . List of other physicians . Hospitalizations,  surgeries, and ER visits in previous 12 months . Vitals . Screenings to include cognitive, depression, and falls .  Referrals and appointments  In addition, I have reviewed and discussed with patient certain preventive protocols, quality metrics, and best practice recommendations. A written personalized care plan for preventive services as well as general preventive health recommendations were provided to patient.     Gerilyn Nestle, RN  02/03/2017  Reviewed documentation provided by RN and agree w/ above.  Annye Asa, MD

## 2017-02-03 ENCOUNTER — Encounter: Payer: Self-pay | Admitting: Family Medicine

## 2017-02-03 ENCOUNTER — Ambulatory Visit (INDEPENDENT_AMBULATORY_CARE_PROVIDER_SITE_OTHER): Payer: Medicare Other | Admitting: Family Medicine

## 2017-02-03 ENCOUNTER — Ambulatory Visit: Payer: Medicare Other

## 2017-02-03 VITALS — BP 100/50 | HR 70 | Temp 97.7°F | Resp 18 | Ht 60.0 in | Wt 118.4 lb

## 2017-02-03 DIAGNOSIS — Z0001 Encounter for general adult medical examination with abnormal findings: Secondary | ICD-10-CM

## 2017-02-03 DIAGNOSIS — I10 Essential (primary) hypertension: Secondary | ICD-10-CM | POA: Diagnosis not present

## 2017-02-03 DIAGNOSIS — E2839 Other primary ovarian failure: Secondary | ICD-10-CM

## 2017-02-03 DIAGNOSIS — Z23 Encounter for immunization: Secondary | ICD-10-CM | POA: Diagnosis not present

## 2017-02-03 DIAGNOSIS — Z0000 Encounter for general adult medical examination without abnormal findings: Secondary | ICD-10-CM

## 2017-02-03 LAB — BASIC METABOLIC PANEL
BUN: 24 mg/dL — AB (ref 6–23)
CHLORIDE: 103 meq/L (ref 96–112)
CO2: 27 meq/L (ref 19–32)
Calcium: 9.3 mg/dL (ref 8.4–10.5)
Creatinine, Ser: 0.69 mg/dL (ref 0.40–1.20)
GFR: 106.41 mL/min (ref >60.00)
GLUCOSE: 89 mg/dL (ref 70–99)
POTASSIUM: 4.9 meq/L (ref 3.5–5.1)
SODIUM: 135 meq/L (ref 135–145)

## 2017-02-03 LAB — LIPID PANEL
CHOLESTEROL: 140 mg/dL (ref 0–200)
HDL: 61.3 mg/dL (ref >39.00)
LDL CALC: 61 mg/dL (ref 0–99)
NonHDL: 79.11
TRIGLYCERIDES: 92 mg/dL (ref 0.0–149.0)
Total CHOL/HDL Ratio: 2
VLDL: 18.4 mg/dL (ref 0.0–40.0)

## 2017-02-03 LAB — CBC WITH DIFFERENTIAL/PLATELET
BASOS ABS: 0.1 10*3/uL (ref 0.0–0.1)
Basophils Relative: 0.9 % (ref 0.0–3.0)
Eosinophils Absolute: 0.5 10*3/uL (ref 0.0–0.7)
Eosinophils Relative: 7 % — ABNORMAL HIGH (ref 0.0–5.0)
HCT: 38.7 % (ref 36.0–46.0)
Hemoglobin: 12.8 g/dL (ref 12.0–15.0)
LYMPHS ABS: 2 10*3/uL (ref 0.7–4.0)
Lymphocytes Relative: 27.1 % (ref 12.0–46.0)
MCHC: 33.2 g/dL (ref 30.0–36.0)
MCV: 95 fl (ref 78.0–100.0)
MONO ABS: 1.1 10*3/uL — AB (ref 0.1–1.0)
MONOS PCT: 15.8 % — AB (ref 3.0–12.0)
NEUTROS ABS: 3.5 10*3/uL (ref 1.4–7.7)
NEUTROS PCT: 49.2 % (ref 43.0–77.0)
PLATELETS: 332 10*3/uL (ref 150.0–400.0)
RBC: 4.07 Mil/uL (ref 3.87–5.11)
RDW: 13.2 % (ref 11.5–15.5)
WBC: 7.2 10*3/uL (ref 4.0–10.5)

## 2017-02-03 LAB — HEPATIC FUNCTION PANEL
ALBUMIN: 4.4 g/dL (ref 3.5–5.2)
ALK PHOS: 71 U/L (ref 39–117)
ALT: 14 U/L (ref 0–35)
AST: 16 U/L (ref 0–37)
Bilirubin, Direct: 0.1 mg/dL (ref 0.0–0.3)
TOTAL PROTEIN: 7 g/dL (ref 6.0–8.3)
Total Bilirubin: 0.5 mg/dL (ref 0.2–1.2)

## 2017-02-03 LAB — TSH: TSH: 1.37 u[IU]/mL (ref 0.35–4.50)

## 2017-02-03 NOTE — Assessment & Plan Note (Signed)
Chronic problem.  BP is over-controlled today.  Decrease Amlodipine to 5mg  daily and continue to monitor.  Pt expressed understanding and is in agreement w/ plan.

## 2017-02-03 NOTE — Patient Instructions (Addendum)
Follow up in 6 months to recheck BP We'll notify you of your lab results and make any changes if needed Decrease the Amlodipine to 1/2 tab daily No other med changes at this time Call with any questions or concerns Keep up the good work!  You look great!!!  Schedule bone scan   Bring a copy of your living will and/or healthcare power of attorney to your next office visit.  Continue doing brain stimulating activities (puzzles, reading, adult coloring books, staying active) to keep memory sharp.   Health Maintenance, Female Adopting a healthy lifestyle and getting preventive care can go a long way to promote health and wellness. Talk with your health care provider about what schedule of regular examinations is right for you. This is a good chance for you to check in with your provider about disease prevention and staying healthy. In between checkups, there are plenty of things you can do on your own. Experts have done a lot of research about which lifestyle changes and preventive measures are most likely to keep you healthy. Ask your health care provider for more information. Weight and diet Eat a healthy diet  Be sure to include plenty of vegetables, fruits, low-fat dairy products, and lean protein.  Do not eat a lot of foods high in solid fats, added sugars, or salt.  Get regular exercise. This is one of the most important things you can do for your health. ? Most adults should exercise for at least 150 minutes each week. The exercise should increase your heart rate and make you sweat (moderate-intensity exercise). ? Most adults should also do strengthening exercises at least twice a week. This is in addition to the moderate-intensity exercise.  Maintain a healthy weight  Body mass index (BMI) is a measurement that can be used to identify possible weight problems. It estimates body fat based on height and weight. Your health care provider can help determine your BMI and help you achieve  or maintain a healthy weight.  For females 59 years of age and older: ? A BMI below 18.5 is considered underweight. ? A BMI of 18.5 to 24.9 is normal. ? A BMI of 25 to 29.9 is considered overweight. ? A BMI of 30 and above is considered obese.  Watch levels of cholesterol and blood lipids  You should start having your blood tested for lipids and cholesterol at 76 years of age, then have this test every 5 years.  You may need to have your cholesterol levels checked more often if: ? Your lipid or cholesterol levels are high. ? You are older than 76 years of age. ? You are at high risk for heart disease.  Cancer screening Lung Cancer  Lung cancer screening is recommended for adults 66-88 years old who are at high risk for lung cancer because of a history of smoking.  A yearly low-dose CT scan of the lungs is recommended for people who: ? Currently smoke. ? Have quit within the past 15 years. ? Have at least a 30-pack-year history of smoking. A pack year is smoking an average of one pack of cigarettes a day for 1 year.  Yearly screening should continue until it has been 15 years since you quit.  Yearly screening should stop if you develop a health problem that would prevent you from having lung cancer treatment.  Breast Cancer  Practice breast self-awareness. This means understanding how your breasts normally appear and feel.  It also means doing regular breast self-exams.  Let your health care provider know about any changes, no matter how small.  If you are in your 20s or 30s, you should have a clinical breast exam (CBE) by a health care provider every 1-3 years as part of a regular health exam.  If you are 54 or older, have a CBE every year. Also consider having a breast X-ray (mammogram) every year.  If you have a family history of breast cancer, talk to your health care provider about genetic screening.  If you are at high risk for breast cancer, talk to your health care  provider about having an MRI and a mammogram every year.  Breast cancer gene (BRCA) assessment is recommended for women who have family members with BRCA-related cancers. BRCA-related cancers include: ? Breast. ? Ovarian. ? Tubal. ? Peritoneal cancers.  Results of the assessment will determine the need for genetic counseling and BRCA1 and BRCA2 testing.  Cervical Cancer Your health care provider may recommend that you be screened regularly for cancer of the pelvic organs (ovaries, uterus, and vagina). This screening involves a pelvic examination, including checking for microscopic changes to the surface of your cervix (Pap test). You may be encouraged to have this screening done every 3 years, beginning at age 29.  For women ages 45-65, health care providers may recommend pelvic exams and Pap testing every 3 years, or they may recommend the Pap and pelvic exam, combined with testing for human papilloma virus (HPV), every 5 years. Some types of HPV increase your risk of cervical cancer. Testing for HPV may also be done on women of any age with unclear Pap test results.  Other health care providers may not recommend any screening for nonpregnant women who are considered low risk for pelvic cancer and who do not have symptoms. Ask your health care provider if a screening pelvic exam is right for you.  If you have had past treatment for cervical cancer or a condition that could lead to cancer, you need Pap tests and screening for cancer for at least 20 years after your treatment. If Pap tests have been discontinued, your risk factors (such as having a new sexual partner) need to be reassessed to determine if screening should resume. Some women have medical problems that increase the chance of getting cervical cancer. In these cases, your health care provider may recommend more frequent screening and Pap tests.  Colorectal Cancer  This type of cancer can be detected and often prevented.  Routine  colorectal cancer screening usually begins at 76 years of age and continues through 76 years of age.  Your health care provider may recommend screening at an earlier age if you have risk factors for colon cancer.  Your health care provider may also recommend using home test kits to check for hidden blood in the stool.  A small camera at the end of a tube can be used to examine your colon directly (sigmoidoscopy or colonoscopy). This is done to check for the earliest forms of colorectal cancer.  Routine screening usually begins at age 42.  Direct examination of the colon should be repeated every 5-10 years through 76 years of age. However, you may need to be screened more often if early forms of precancerous polyps or small growths are found.  Skin Cancer  Check your skin from head to toe regularly.  Tell your health care provider about any new moles or changes in moles, especially if there is a change in a mole's shape or color.  Also tell your health care provider if you have a mole that is larger than the size of a pencil eraser.  Always use sunscreen. Apply sunscreen liberally and repeatedly throughout the day.  Protect yourself by wearing long sleeves, pants, a wide-brimmed hat, and sunglasses whenever you are outside.  Heart disease, diabetes, and high blood pressure  High blood pressure causes heart disease and increases the risk of stroke. High blood pressure is more likely to develop in: ? People who have blood pressure in the high end of the normal range (130-139/85-89 mm Hg). ? People who are overweight or obese. ? People who are African American.  If you are 70-67 years of age, have your blood pressure checked every 3-5 years. If you are 62 years of age or older, have your blood pressure checked every year. You should have your blood pressure measured twice-once when you are at a hospital or clinic, and once when you are not at a hospital or clinic. Record the average of the  two measurements. To check your blood pressure when you are not at a hospital or clinic, you can use: ? An automated blood pressure machine at a pharmacy. ? A home blood pressure monitor.  If you are between 100 years and 54 years old, ask your health care provider if you should take aspirin to prevent strokes.  Have regular diabetes screenings. This involves taking a blood sample to check your fasting blood sugar level. ? If you are at a normal weight and have a low risk for diabetes, have this test once every three years after 76 years of age. ? If you are overweight and have a high risk for diabetes, consider being tested at a younger age or more often. Preventing infection Hepatitis B  If you have a higher risk for hepatitis B, you should be screened for this virus. You are considered at high risk for hepatitis B if: ? You were born in a country where hepatitis B is common. Ask your health care provider which countries are considered high risk. ? Your parents were born in a high-risk country, and you have not been immunized against hepatitis B (hepatitis B vaccine). ? You have HIV or AIDS. ? You use needles to inject street drugs. ? You live with someone who has hepatitis B. ? You have had sex with someone who has hepatitis B. ? You get hemodialysis treatment. ? You take certain medicines for conditions, including cancer, organ transplantation, and autoimmune conditions.  Hepatitis C  Blood testing is recommended for: ? Everyone born from [birth year] through 1965. ? Anyone with known risk factors for hepatitis C.  Sexually transmitted infections (STIs)  You should be screened for sexually transmitted infections (STIs) including gonorrhea and chlamydia if: ? You are sexually active and are younger than 76 years of age. ? You are older than 76 years of age and your health care provider tells you that you are at risk for this type of infection. ? Your sexual activity has changed since you  were last screened and you are at an increased risk for chlamydia or gonorrhea. Ask your health care provider if you are at risk.  If you do not have HIV, but are at risk, it may be recommended that you take a prescription medicine daily to prevent HIV infection. This is called pre-exposure prophylaxis (PrEP). You are considered at risk if: ? You are sexually active and do not regularly use condoms or know the HIV status of  your partner(s). ? You take drugs by injection. ? You are sexually active with a partner who has HIV.  Talk with your health care provider about whether you are at high risk of being infected with HIV. If you choose to begin PrEP, you should first be tested for HIV. You should then be tested every 3 months for as long as you are taking PrEP. Pregnancy  If you are premenopausal and you may become pregnant, ask your health care provider about preconception counseling.  If you may become pregnant, take 400 to 800 micrograms (mcg) of folic acid every day.  If you want to prevent pregnancy, talk to your health care provider about birth control (contraception). Osteoporosis and menopause  Osteoporosis is a disease in which the bones lose minerals and strength with aging. This can result in serious bone fractures. Your risk for osteoporosis can be identified using a bone density scan.  If you are 3 years of age or older, or if you are at risk for osteoporosis and fractures, ask your health care provider if you should be screened.  Ask your health care provider whether you should take a calcium or vitamin D supplement to lower your risk for osteoporosis.  Menopause may have certain physical symptoms and risks.  Hormone replacement therapy may reduce some of these symptoms and risks. Talk to your health care provider about whether hormone replacement therapy is right for you. Follow these instructions at home:  Schedule regular health, dental, and eye exams.  Stay current  with your immunizations.  Do not use any tobacco products including cigarettes, chewing tobacco, or electronic cigarettes.  If you are pregnant, do not drink alcohol.  If you are breastfeeding, limit how much and how often you drink alcohol.  Limit alcohol intake to no more than 1 drink per day for nonpregnant women. One drink equals 12 ounces of beer, 5 ounces of wine, or 1 ounces of hard liquor.  Do not use street drugs.  Do not share needles.  Ask your health care provider for help if you need support or information about quitting drugs.  Tell your health care provider if you often feel depressed.  Tell your health care provider if you have ever been abused or do not feel safe at home. This information is not intended to replace advice given to you by your health care provider. Make sure you discuss any questions you have with your health care provider. Document Released: 10/26/2010 Document Revised: 09/18/2015 Document Reviewed: 01/14/2015 Elsevier Interactive Patient Education  [employer].

## 2017-02-03 NOTE — Progress Notes (Signed)
   Subjective:    Patient ID: Amanda Jackson, female    DOB: 1940-09-17, 76 y.o.   MRN: [id number]  HPI CPE- UTD on colonoscopy- due for recall Dec 2018 (Dr Havery Moros).  UTD on mammo, DEXA ordered.  Flu shot today.   Review of Systems Patient reports no vision/ hearing changes, adenopathy,fever, weight change,  persistant/recurrent hoarseness , swallowing issues, chest pain, palpitations, edema, hemoptysis, dyspnea (rest/exertional/paroxysmal nocturnal), gastrointestinal bleeding (melena, rectal bleeding), abdominal pain, significant heartburn, bowel changes, GU symptoms (dysuria, hematuria, incontinence), Gyn symptoms (abnormal  bleeding, pain), focal weakness, memory loss, numbness & tingling, skin/hair/nail changes, abnormal bruising or bleeding, anxiety, or depression.   + syncope after nitro due to hypotension + chronic cough    Objective:   Physical Exam General Appearance:    Alert, cooperative, no distress, appears stated age  Head:    Normocephalic, without obvious abnormality, atraumatic  Eyes:    PERRL, conjunctiva/corneas clear, EOM's intact, fundi    benign, both eyes  Ears:    Normal TM's and external ear canals, both ears  Nose:   Nares normal, septum midline, mucosa normal, no drainage    or sinus tenderness  Throat:   Lips, mucosa, and tongue normal; teeth and gums normal  Neck:   Supple, symmetrical, trachea midline, no adenopathy;    Thyroid: no enlargement/tenderness/nodules  Back:     Symmetric, no curvature, ROM normal, no CVA tenderness  Lungs:     Clear to auscultation bilaterally, respirations unlabored  Chest Wall:    No tenderness or deformity   Heart:    Regular rate and rhythm, S1 and S2 normal, no murmur, rub   or gallop  Breast Exam:    Deferred to GYN  Abdomen:     Soft, non-tender, bowel sounds active all four quadrants,    no masses, no organomegaly  Genitalia:    Deferred to GYN  Rectal:    Extremities:   Extremities normal, atraumatic, no  cyanosis or edema  Pulses:   2+ and symmetric all extremities  Skin:   Skin color, texture, turgor normal, no rashes or lesions  Lymph nodes:   Cervical, supraclavicular, and axillary nodes normal  Neurologic:   CNII-XII intact, normal strength, sensation and reflexes    throughout          Assessment & Plan:

## 2017-02-03 NOTE — Assessment & Plan Note (Signed)
Pt's PE WNL.  UTD on mammo.  DEXA ordered.  Pt due for recall colonoscopy later this year.  Check labs.  Anticipatory guidance provided.

## 2017-02-07 ENCOUNTER — Encounter: Payer: Self-pay | Admitting: General Practice

## 2017-02-11 ENCOUNTER — Other Ambulatory Visit: Payer: Self-pay | Admitting: Cardiology

## 2017-02-11 DIAGNOSIS — I10 Essential (primary) hypertension: Secondary | ICD-10-CM

## 2017-02-11 DIAGNOSIS — E876 Hypokalemia: Secondary | ICD-10-CM

## 2017-02-18 ENCOUNTER — Other Ambulatory Visit: Payer: Self-pay | Admitting: Allergy and Immunology

## 2017-02-18 ENCOUNTER — Other Ambulatory Visit: Payer: Self-pay | Admitting: Cardiology

## 2017-02-18 DIAGNOSIS — I10 Essential (primary) hypertension: Secondary | ICD-10-CM

## 2017-02-18 DIAGNOSIS — E876 Hypokalemia: Secondary | ICD-10-CM

## 2017-02-22 ENCOUNTER — Other Ambulatory Visit: Payer: Self-pay | Admitting: Cardiology

## 2017-02-23 ENCOUNTER — Telehealth: Payer: Self-pay | Admitting: Family Medicine

## 2017-02-23 MED ORDER — AMLODIPINE BESYLATE 5 MG PO TABS: 5.0000 mg | ORAL_TABLET | Freq: Every day | ORAL | 1 refills | Status: DC

## 2017-02-23 NOTE — Telephone Encounter (Signed)
amLODipine (NORVASC) 5 MG tablet  Medication  Date: 02/23/2017 Department: [facility] Ordering/Authorizing: Midge Minium, MD  Order Providers   Prescribing Provider Encounter Provider  Midge Minium, MD Midge Minium, MD  Medication Detail    Disp Refills Start End   amLODipine (NORVASC) 5 MG tablet 90 tablet 1 02/23/2017    Sig - Route: Take 1 tablet (5 mg total) by mouth daily. - Oral   Sent to pharmacy as: amLODipine (NORVASC) 5 MG tablet   E-Prescribing Status: Receipt confirmed by pharmacy (02/23/2017 1:46 PM EDT)   Pharmacy   [id number] - [place], [place].

## 2017-02-23 NOTE — Telephone Encounter (Signed)
New dose sent to pharmacy. 

## 2017-02-23 NOTE — Telephone Encounter (Signed)
[place] for 5mg  once daily

## 2017-02-23 NOTE — Telephone Encounter (Signed)
Pt asking for a refill on amlodipine for a 5mg  tablet to be called into [facility] on [address]. For 90 day supply. She states that it is hard to cut the 10mg  tablet in half.

## 2017-02-23 NOTE — Telephone Encounter (Signed)
Please advise, on the SIG. Per last note pt was to decrease to 5mg , did you want that BID?

## 2017-02-28 ENCOUNTER — Ambulatory Visit: Payer: Medicare Other | Admitting: Allergy and Immunology

## 2017-02-28 ENCOUNTER — Encounter: Payer: Self-pay | Admitting: Allergy and Immunology

## 2017-02-28 VITALS — BP 120/72 | HR 82 | Temp 98.0°F | Resp 18 | Ht 61.5 in

## 2017-02-28 DIAGNOSIS — J45901 Unspecified asthma with (acute) exacerbation: Secondary | ICD-10-CM | POA: Diagnosis not present

## 2017-02-28 DIAGNOSIS — J0100 Acute maxillary sinusitis, unspecified: Secondary | ICD-10-CM | POA: Diagnosis not present

## 2017-02-28 DIAGNOSIS — K219 Gastro-esophageal reflux disease without esophagitis: Secondary | ICD-10-CM | POA: Diagnosis not present

## 2017-02-28 MED ORDER — FLUTICASONE PROPIONATE HFA 220 MCG/ACT IN AERO: 2.0000 | INHALATION_SPRAY | Freq: Two times a day (BID) | RESPIRATORY_TRACT | 5 refills | Status: DC

## 2017-02-28 MED ORDER — PREDNISONE 1 MG PO TABS: 10.0000 mg | ORAL_TABLET | Freq: Every day | ORAL | Status: AC

## 2017-02-28 NOTE — Progress Notes (Signed)
Follow-up Note  RE: Amanda Jackson MRN: [id number] DOB: 1940-05-26 Date of Office Visit: 02/28/2017  Primary care provider: Midge Minium, MD Referring provider: Midge Minium, MD  History of present illness: Amanda Jackson is a 76 y.o. female with persistent asthma, allergic rhinitis, gastroesophageal reflux, and history of nasal polyps presenting today for sick visit.  She was last seen in this clinic on Aug 31, 2016.  She complains of  persistent cough which has been troubling her over the past 3 months.  She believes that the cough is exacerbated by the rapid weather changes.  When she coughs, she can feel mucus moving in her chest, however does not expectorate mucus.  She has been wheezing frequently.  The wheezing occurs throughout the day but seems to be worse at nighttime.  The coughing and wheezing have been further exacerbated because she ran out of montelukast several days ago.  She is currently taking Flovent 110 g HFA, 2 inhalations via spacer device twice daily.  She does not complain of fevers or discolored mucus production.  She also complains of nasal congestion and thick mucus when blowing her nose, particularly in the morning time.    She reports that prior to starting the omeprazole her reflux symptoms had been "horrible", however while taking omeprazole she rarely experiences heartburn.   Her primary care physician follows her bone density and she reports that she recently had a DEXA scan, however she is uncertain of the results.   Assessment and plan: Asthma with acute exacerbation The history suggests asthma exacerbation and spirometry results support this with 400 mL (26%) postbronchodilator improvement.  Prednisone has been provided, 20 mg x 4 days, 10 mg x1 day, then stop.  We will step up therapy from Flovent 110 g HFA 2 Flovent 220 g HFA, 2 inhalations via spacer device twice daily.  Restart montelukast 10 mg daily bedtime.  Continue albuterol HFA,  1-2 inhalations every 4-6 hours if needed.  The patient's has been asked to contact me if her symptoms persist or progress. Otherwise, she may return for follow up in 4 months.  Acute sinusitis  Prednisone has been provided (as above).  Guaifenesin [phone] mg twice daily as needed with adequate hydration as discussed.   Continue nasal saline/budesonide lavage.  The patient is to contact me if her symptoms progress or she becomes febrile.  GERD (gastroesophageal reflux disease)  Continue reflux lifestyle modifications and omeprazole as prescribed.   Meds ordered this encounter  Medications  . fluticasone (FLOVENT HFA) 220 MCG/ACT inhaler    Sig: Inhale 2 puffs 2 (two) times daily into the lungs.    Dispense:  1 Inhaler    Refill:  5  . predniSONE (DELTASONE) tablet 10 mg    Diagnostics: Spirometry reveals an FVC of 1.40 L with significant (260 mL, 26%) postbronchodilator improvement.  Please see scanned spirometry results for details.    Physical examination: Blood pressure 120/72, pulse 82, temperature 98 F (36.7 C), temperature source Oral, resp. rate 18, height 5' 1.5" (1.562 m), SpO2 95 %.  General: Alert, interactive, in no acute distress. HEENT: TMs pearly gray, turbinates moderately edematous with thick discharge, post-pharynx mildly erythematous. Neck: Supple without lymphadenopathy. Lungs: Mildly decreased breath sounds bilaterally without wheezing, rhonchi or rales. CV: Normal S1, S2 without murmurs. Skin: Warm and dry, without lesions or rashes.  The following portions of the patient's history were reviewed and updated as appropriate: allergies, current medications, past family history, past medical history,  past social history, past surgical history and problem list.  Allergies as of 02/28/2017   No Known Allergies     Medication List        Accurate as of 02/28/17  1:27 PM. Always use your most recent med list.          albuterol 108 (90 Base)  MCG/ACT inhaler Commonly known as:  PROAIR HFA INHALE TWO PUFFS EVERY 4 HOURS AS NEEDED FOR COUGH OR WHEEZE.   amLODipine 5 MG tablet Commonly known as:  NORVASC Take 1 tablet (5 mg total) by mouth daily.   aspirin 81 MG EC tablet Take 81 mg by mouth daily.   Beclomethasone Dipropionate 80 MCG/ACT Aers Commonly known as:  QNASL ONE SPRAY EACH NOSTRIL TWICE DAILY FOR STUFFY NOSE OR DRAINAGE.   budesonide 0.5 MG/2ML nebulizer solution Commonly known as:  PULMICORT Mix budesonide/saline nasal irrigation twice a day.   CALCIUM 600+D3 PO Take 600 mg by mouth 2 (two) times daily.   carvedilol 12.5 MG tablet Commonly known as:  COREG TAKE ONE TABLET BY MOUTH TWICE DAILY WITH A MEAL   CENTRUM tablet Take 1 tablet by mouth daily.   conjugated estrogens vaginal cream Commonly known as:  PREMARIN Place 0.5 g vaginally 2 (two) times a week. As needed for spotting   fluticasone 110 MCG/ACT inhaler Commonly known as:  FLOVENT HFA Inhale 2 puffs into the lungs 2 (two) times daily.   fluticasone 220 MCG/ACT inhaler Commonly known as:  FLOVENT HFA Inhale 2 puffs 2 (two) times daily into the lungs.   FOLBIC 2.5-25-2 MG Tabs tablet Generic drug:  folic acid-pyridoxine-cyancobalamin TAKE 1 TABLET BY MOUTH DAILY   ibuprofen 800 MG tablet Commonly known as:  ADVIL,MOTRIN TAKE 1 TABLET (800 MG TOTAL) BY MOUTH 3 (THREE) TIMES DAILY AS NEEDED FOR PAIN   KLOR-CON M20 20 MEQ tablet Generic drug:  potassium chloride SA TAKE ONE TABLET BY MOUTH DAILY   LORazepam 1 MG tablet Commonly known as:  ATIVAN TAKE 1 TABLET BY MOUTH TWICE A DAY   losartan 50 MG tablet Commonly known as:  COZAAR TAKE 1 TABLET BY MOUTH EVERY DAY   montelukast 10 MG tablet Commonly known as:  SINGULAIR TAKE 1 TABLET BY MOUTH EVERY DAY   nitroGLYCERIN 0.4 MG SL tablet Commonly known as:  NITROSTAT AS DIRECTED   omeprazole 20 MG capsule Commonly known as:  PRILOSEC TAKE 1 CAPSULE (20 MG TOTAL) BY MOUTH 2  (TWO) TIMES DAILY.   spironolactone 25 MG tablet Commonly known as:  ALDACTONE TAKE 1 TABLET (25 MG TOTAL) BY MOUTH DAILY. PLEASE KEEP 01/11/17 APPT FOR FURHTER REFILLS   traMADol 50 MG tablet Commonly known as:  ULTRAM Take 50 mg by mouth every 6 (six) hours as needed. Reported on 07/23/2015   TYLENOL ARTHRITIS PAIN 650 MG CR tablet Generic drug:  acetaminophen Take 650 mg by mouth every 8 (eight) hours as needed for pain.   Vitamin B-12 2500 MCG Subl Place under the tongue.       No Known Allergies  Review of systems: Review of systems negative except as noted in HPI / PMHx or noted below: Constitutional: Negative.  HENT: Negative.   Eyes: Negative.  Respiratory: Negative.   Cardiovascular: Negative.  Gastrointestinal: Negative.  Genitourinary: Negative.  Musculoskeletal: Negative.  Neurological: Negative.  Endo/Heme/Allergies: Negative.  Cutaneous: Negative.  Past Medical History:  Diagnosis Date  . Allergy   . Anxiety   . Arthritis   . Asthma   .  Atypical chest pain    Normal Myoview 2008  . Colon polyp   . GERD (gastroesophageal reflux disease)   . Hx of colonoscopy 03/18/09  . Hyperlipidemia   . Hypertension   . Left nasal polyps   . MVP (mitral valve prolapse)   . Seasonal allergies   . Tubulovillous adenoma     Family History  Problem Relation Age of Onset  . Hypertension Son   . Lung cancer Father   . Cancer Father   . Hypertension Father   . Liver cancer Mother   . Cancer Mother   . Hypertension Mother   . Colon cancer Mother   . Colon cancer Sister   . Heart murmur Son   . Stomach cancer Neg Hx   . Ulcerative colitis Neg Hx   . Allergic rhinitis Neg Hx   . Angioedema Neg Hx   . Asthma Neg Hx     Social History   Socioeconomic History  . Marital status: Divorced    Spouse name: Not on file  . Number of children: Not on file  . Years of education: Not on file  . Highest education level: Not on file  Social Needs  . Financial  resource strain: Not on file  . Food insecurity - worry: Not on file  . Food insecurity - inability: Not on file  . Transportation needs - medical: Not on file  . Transportation needs - non-medical: Not on file  Occupational History  . Occupation: Retired    Comment: English Professor  Tobacco Use  . Smoking status: Former Smoker    Last attempt to quit: 09/09/1977    Years since quitting: 39.4  . Smokeless tobacco: Never Used  . Tobacco comment: used to smoke socially. Quit in 1979.  Substance and Sexual Activity  . Alcohol use: Yes    Comment: usually has a glass of wine with dinner every evening  . Drug use: No  . Sexual activity: Not Currently    Birth control/protection: Other-see comments    Comment: HYST  Other Topics Concern  . Not on file  Social History Narrative  . Not on file    I appreciate the opportunity to take part in Danene's care. Please do not hesitate to contact me with questions.  Sincerely,   R. Edgar Frisk, MD

## 2017-02-28 NOTE — Assessment & Plan Note (Addendum)
The history suggests asthma exacerbation and spirometry results support this with 400 mL (26%) postbronchodilator improvement.  Prednisone has been provided, 20 mg x 4 days, 10 mg x1 day, then stop.  We will step up therapy from Flovent 110 g HFA 2 Flovent 220 g HFA, 2 inhalations via spacer device twice daily.  Restart montelukast 10 mg daily bedtime.  Continue albuterol HFA, 1-2 inhalations every 4-6 hours if needed.  The patient's has been asked to contact me if her symptoms persist or progress. Otherwise, she may return for follow up in 4 months.

## 2017-02-28 NOTE — Assessment & Plan Note (Signed)
   Prednisone has been provided (as above).  Guaifenesin [phone] mg twice daily as needed with adequate hydration as discussed.   Continue nasal saline/budesonide lavage.  The patient is to contact me if her symptoms progress or she becomes febrile.

## 2017-02-28 NOTE — Assessment & Plan Note (Signed)
   Continue reflux lifestyle modifications and omeprazole as prescribed.

## 2017-02-28 NOTE — Patient Instructions (Addendum)
Asthma with acute exacerbation The history suggests asthma exacerbation and spirometry results support this with 400 mL (26%) postbronchodilator improvement.  Prednisone has been provided, 20 mg x 4 days, 10 mg x1 day, then stop.  We will step up therapy from Flovent 110 g HFA 2 Flovent 220 g HFA, 2 inhalations via spacer device twice daily.  Restart montelukast 10 mg daily bedtime.  Continue albuterol HFA, 1-2 inhalations every 4-6 hours if needed.  The patient's has been asked to contact me if her symptoms persist or progress. Otherwise, she may return for follow up in 4 months.  Acute sinusitis  Prednisone has been provided (as above).  Guaifenesin [phone] mg twice daily as needed with adequate hydration as discussed.   Continue nasal saline/budesonide lavage.  The patient is to contact me if her symptoms progress or she becomes febrile.  GERD (gastroesophageal reflux disease)  Continue reflux lifestyle modifications and omeprazole as prescribed.   Return in about 4 months (around 06/28/2017), or if symptoms worsen or fail to improve.

## 2017-03-05 ENCOUNTER — Other Ambulatory Visit: Payer: Self-pay | Admitting: Family Medicine

## 2017-03-05 ENCOUNTER — Other Ambulatory Visit: Payer: Self-pay | Admitting: Cardiology

## 2017-03-07 NOTE — Telephone Encounter (Signed)
Last OV 02/03/17 Lorazepam last filled 01/05/17 #60 with 1

## 2017-03-07 NOTE — Telephone Encounter (Signed)
Medication filled to pharmacy as requested.   

## 2017-03-29 ENCOUNTER — Encounter: Payer: Self-pay | Admitting: Allergy and Immunology

## 2017-03-29 ENCOUNTER — Ambulatory Visit (INDEPENDENT_AMBULATORY_CARE_PROVIDER_SITE_OTHER): Payer: Medicare Other | Admitting: Allergy and Immunology

## 2017-03-29 VITALS — BP 118/78 | HR 81 | Temp 97.5°F | Ht 61.0 in | Wt 119.0 lb

## 2017-03-29 DIAGNOSIS — J45901 Unspecified asthma with (acute) exacerbation: Secondary | ICD-10-CM

## 2017-03-29 DIAGNOSIS — J0100 Acute maxillary sinusitis, unspecified: Secondary | ICD-10-CM

## 2017-03-29 DIAGNOSIS — J3089 Other allergic rhinitis: Secondary | ICD-10-CM | POA: Diagnosis not present

## 2017-03-29 DIAGNOSIS — J339 Nasal polyp, unspecified: Secondary | ICD-10-CM

## 2017-03-29 MED ORDER — PREDNISONE 1 MG PO TABS: 10.0000 mg | ORAL_TABLET | Freq: Every day | ORAL | Status: AC

## 2017-03-29 NOTE — Assessment & Plan Note (Addendum)
   Prednisone has been provided, 20 mg x 4 days, 10 mg x1 day, then stop.  Increase nasal saline lavage to 3 or 4 times per day for now, twice a day with budesonide 0.5 mg.  For thick post nasal drainage, add guaifenesin 600 mg (Mucinex)  twice daily as needed with adequate hydration as discussed.

## 2017-03-29 NOTE — Patient Instructions (Addendum)
Acute sinusitis  Prednisone has been provided, 20 mg x 4 days, 10 mg x1 day, then stop.  Increase nasal saline lavage to 3 or 4 times per day for now, twice a day with budesonide 0.5 mg.  For thick post nasal drainage, add guaifenesin 600 mg (Mucinex)  twice daily as needed with adequate hydration as discussed.  Asthma with acute exacerbation  For now and during respiratory tract infections or asthma flares, increase Flovent 220g to 3 inhalations 2 times per day until symptoms have returned to baseline.  Continue montelukast 10 mg daily at bedtime and albuterol HFA, 1-2 inhalations every 4-6 hours as needed.  The patient has been asked to contact me if her symptoms persist or progress. Otherwise, she may return for follow up in 4 months.   Return in about 4 months (around 07/28/2017), or if symptoms worsen or fail to improve.

## 2017-03-29 NOTE — Assessment & Plan Note (Signed)
   For now and during respiratory tract infections or asthma flares, increase Flovent 220g to 3 inhalations 2 times per day until symptoms have returned to baseline.  Continue montelukast 10 mg daily at bedtime and albuterol HFA, 1-2 inhalations every 4-6 hours as needed.  The patient has been asked to contact me if her symptoms persist or progress. Otherwise, she may return for follow up in 4 months.

## 2017-03-29 NOTE — Progress Notes (Addendum)
Follow-up Note  RE: Amanda Jackson MRN: [id number] DOB: 12-Jun-1940 Date of Office Visit: 03/29/2017  Primary care provider: Midge Minium, MD Referring provider: Midge Minium, MD  History of present illness: Amanda Jackson is a 76 y.o. female with persistent asthma, allergic rhinitis, gastroesophageal reflux disease, and history of nasal polyps presenting today for a sick visit.  She reports that since Thursday she has coughing, wheezing, thick postnasal drainage, globus sensation throat, hoarseness, and frontal sinus pressure.  She denies fevers, chills, and discolored mucus production.  She has required albuterol rescue two times per day on average and has experienced nocturnal awakenings due to coughing.   Assessment and plan: Acute sinusitis  Prednisone has been provided, 20 mg x 4 days, 10 mg x1 day, then stop.  Increase nasal saline lavage to 3 or 4 times per day for now, twice a day with budesonide 0.5 mg.  For thick post nasal drainage, add guaifenesin 600 mg (Mucinex)  twice daily as needed with adequate hydration as discussed.  Asthma with acute exacerbation  For now and during respiratory tract infections or asthma flares, increase Flovent 220g to 3 inhalations 2 times per day until symptoms have returned to baseline.  Continue montelukast 10 mg daily at bedtime and albuterol HFA, 1-2 inhalations every 4-6 hours as needed.  The patient has been asked to contact me if her symptoms persist or progress. Otherwise, she may return for follow up in 4 months.   Meds ordered this encounter  Medications  . predniSONE (DELTASONE) tablet 10 mg    Diagnostics: Spirometry reveals an FVC of 1.46 L (72% predicted) and an FEV1 of 1.02 L (64% predicted) with 11% postbronchodilator improvement.  Please see scanned spirometry results for details.    Physical examination: Blood pressure 118/78, pulse 81, temperature (!) 97.5 F (36.4 C), height 5\' 1"  (1.549 m), weight  119 lb (54 kg), SpO2 96 %.  General: Alert, interactive, in no acute distress. HEENT: TMs pearly gray, turbinates edematous without discharge, post-pharynx moderately erythematous. Neck: Supple without lymphadenopathy. Lungs: Mildly decreased breath sounds bilaterally without wheezing. CV: Normal S1, S2 without murmurs. Skin: Warm and dry, without lesions or rashes.  The following portions of the patient's history were reviewed and updated as appropriate: allergies, current medications, past family history, past medical history, past social history, past surgical history and problem list.  Allergies as of 03/29/2017   No Known Allergies     Medication List        Accurate as of 03/29/17  8:38 PM. Always use your most recent med list.          albuterol 108 (90 Base) MCG/ACT inhaler Commonly known as:  PROAIR HFA INHALE TWO PUFFS EVERY 4 HOURS AS NEEDED FOR COUGH OR WHEEZE.   amLODipine 5 MG tablet Commonly known as:  NORVASC Take 1 tablet (5 mg total) by mouth daily.   aspirin 81 MG EC tablet Take 81 mg by mouth daily.   Beclomethasone Dipropionate 80 MCG/ACT Aers Commonly known as:  QNASL ONE SPRAY EACH NOSTRIL TWICE DAILY FOR STUFFY NOSE OR DRAINAGE.   budesonide 0.5 MG/2ML nebulizer solution Commonly known as:  PULMICORT Mix budesonide/saline nasal irrigation twice a day.   CALCIUM 600+D3 PO Take 600 mg by mouth 2 (two) times daily.   carvedilol 12.5 MG tablet Commonly known as:  COREG TAKE ONE TABLET BY MOUTH TWICE DAILY WITH A MEAL   CENTRUM tablet Take 1 tablet by mouth daily.  conjugated estrogens vaginal cream Commonly known as:  PREMARIN Place 0.5 g vaginally 2 (two) times a week. As needed for spotting   fluticasone 110 MCG/ACT inhaler Commonly known as:  FLOVENT HFA Inhale 2 puffs into the lungs 2 (two) times daily.   fluticasone 220 MCG/ACT inhaler Commonly known as:  FLOVENT HFA Inhale 2 puffs 2 (two) times daily into the lungs.   FOLBIC  2.5-25-2 MG Tabs tablet Generic drug:  folic acid-pyridoxine-cyancobalamin TAKE 1 TABLET BY MOUTH DAILY   ibuprofen 800 MG tablet Commonly known as:  ADVIL,MOTRIN TAKE 1 TABLET (800 MG TOTAL) BY MOUTH 3 (THREE) TIMES DAILY AS NEEDED FOR PAIN   KLOR-CON M20 20 MEQ tablet Generic drug:  potassium chloride SA TAKE 1 TABLET BY MOUTH EVERY DAY   LORazepam 1 MG tablet Commonly known as:  ATIVAN TAKE 1 TABLET BY MOUTH TWICE A DAY   losartan 50 MG tablet Commonly known as:  COZAAR TAKE 1 TABLET BY MOUTH EVERY DAY   montelukast 10 MG tablet Commonly known as:  SINGULAIR TAKE 1 TABLET BY MOUTH EVERY DAY   nitroGLYCERIN 0.4 MG SL tablet Commonly known as:  NITROSTAT AS DIRECTED   omeprazole 20 MG capsule Commonly known as:  PRILOSEC TAKE 1 CAPSULE (20 MG TOTAL) BY MOUTH 2 (TWO) TIMES DAILY.   spironolactone 25 MG tablet Commonly known as:  ALDACTONE TAKE 1 TABLET (25 MG TOTAL) BY MOUTH DAILY. PLEASE KEEP 01/11/17 APPT FOR FURHTER REFILLS   traMADol 50 MG tablet Commonly known as:  ULTRAM Take 50 mg by mouth every 6 (six) hours as needed. Reported on 07/23/2015   TYLENOL ARTHRITIS PAIN 650 MG CR tablet Generic drug:  acetaminophen Take 650 mg by mouth every 8 (eight) hours as needed for pain.   Vitamin B-12 2500 MCG Subl Place under the tongue.       No Known Allergies  Review of systems: Review of systems negative except as noted in HPI / PMHx or noted below: Constitutional: Negative.  HENT: Negative.   Eyes: Negative.  Respiratory: Negative.   Cardiovascular: Negative.  Gastrointestinal: Negative.  Genitourinary: Negative.  Musculoskeletal: Negative.  Neurological: Negative.  Endo/Heme/Allergies: Negative.  Cutaneous: Negative.  Past Medical History:  Diagnosis Date  . Allergy   . Anxiety   . Arthritis   . Asthma   . Atypical chest pain    Normal Myoview 2008  . Colon polyp   . GERD (gastroesophageal reflux disease)   . Hx of colonoscopy 03/18/09  .  Hyperlipidemia   . Hypertension   . Left nasal polyps   . MVP (mitral valve prolapse)   . Seasonal allergies   . Tubulovillous adenoma     Family History  Problem Relation Age of Onset  . Hypertension Son   . Lung cancer Father   . Cancer Father   . Hypertension Father   . Liver cancer Mother   . Cancer Mother   . Hypertension Mother   . Colon cancer Mother   . Colon cancer Sister   . Heart murmur Son   . Stomach cancer Neg Hx   . Ulcerative colitis Neg Hx   . Allergic rhinitis Neg Hx   . Angioedema Neg Hx   . Asthma Neg Hx     Social History   Socioeconomic History  . Marital status: Divorced    Spouse name: Not on file  . Number of children: Not on file  . Years of education: Not on file  . Highest education level:  Not on file  Social Needs  . Financial resource strain: Not on file  . Food insecurity - worry: Not on file  . Food insecurity - inability: Not on file  . Transportation needs - medical: Not on file  . Transportation needs - non-medical: Not on file  Occupational History  . Occupation: Retired    Comment: English Professor  Tobacco Use  . Smoking status: Former Smoker    Last attempt to quit: 09/09/1977    Years since quitting: 39.5  . Smokeless tobacco: Never Used  . Tobacco comment: used to smoke socially. Quit in 1979.  Substance and Sexual Activity  . Alcohol use: Yes    Comment: usually has a glass of wine with dinner every evening  . Drug use: No  . Sexual activity: Not Currently    Birth control/protection: Other-see comments    Comment: HYST  Other Topics Concern  . Not on file  Social History Narrative  . Not on file    I appreciate the opportunity to take part in Shalon's care. Please do not hesitate to contact me with questions.  Sincerely,   R. Edgar Frisk, MD

## 2017-03-30 ENCOUNTER — Telehealth: Payer: Self-pay

## 2017-03-30 NOTE — Telephone Encounter (Signed)
Called patient to find out and received no answer.

## 2017-03-30 NOTE — Telephone Encounter (Signed)
-----   Message from Adelina Mings, MD sent at 03/29/2017  8:37 PM EST ----- Please call Taleigh and find out if she is still using budesonide/saline rinses or if she is using Qnasl. Thanks.

## 2017-03-31 ENCOUNTER — Ambulatory Visit (INDEPENDENT_AMBULATORY_CARE_PROVIDER_SITE_OTHER): Payer: Medicare Other | Admitting: Gastroenterology

## 2017-03-31 ENCOUNTER — Encounter: Payer: Self-pay | Admitting: Gastroenterology

## 2017-03-31 VITALS — BP 112/78 | HR 80 | Ht 61.0 in | Wt 119.0 lb

## 2017-03-31 DIAGNOSIS — Z800 Family history of malignant neoplasm of digestive organs: Secondary | ICD-10-CM

## 2017-03-31 NOTE — Telephone Encounter (Signed)
Left message to return call 

## 2017-03-31 NOTE — Patient Instructions (Addendum)
If you are age 76 or older, your body mass index should be between 23-30. Your Body mass index is 22.48 kg/m. If this is out of the aforementioned range listed, please consider follow up with your Primary Care Provider.  If you are age 39 or younger, your body mass index should be between 19-25. Your Body mass index is 22.48 kg/m. If this is out of the aformentioned range listed, please consider follow up with your Primary Care Provider.   You will be due for your next colonoscopy in December 2020.  Thank you for entrusting me with your care, Dr. [place] Cellar

## 2017-03-31 NOTE — Progress Notes (Signed)
HPI :  76 year old female, former patient of Dr. Olevia Perches last seen in July 2016, here for a follow-up visit and to establish care with me.  She is here to discuss colon cancer screening. Her mother had colon cancer at age 14 and her sister was diagnosed in her 32s as well. Her last colonoscopy was in 2015 and she had a benign polyp removed. 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 She states she was told in the past she did not warrant any further colon cancer screening, however she is concerned about this and wishes to continue screening as long as she can possibly do it.   She denies any blood in her stool. She has 1 normal bowel movement every morning. She has occasional lower abdominal cramping which is been normal for her. She denies any abdominal plan other complaints. She denies any weight loss. She had negative Hemoccult stool testing in August 2016. Is a history of polycystic liver disease noted on prior imaging, her liver function testing has been normal. She denies any right upper quadrant pain. He does not have any renal cyst noted.  Labs on 02/03/17 - Hgb 12.8, LFTs normal  CT scan 11/21/2014 - polycystic liver disease, renal calculi  Colonoscopy 03/27/2014 - ileocolonic anastomosis, diverticulosis, flat polyp sigmoid - normal colon on path, good prep Colonoscopy 03/18/2009 - small polyp, adenoma  Past Medical History:  Diagnosis Date  . Allergy   . Anxiety   . Arthritis   . Asthma   . Atypical chest pain    Normal Myoview 2008  . Colon polyp   . GERD (gastroesophageal reflux disease)   . Hx of colonoscopy 03/18/09  . Hyperlipidemia   . Hypertension   . Left nasal polyps   . MVP (mitral valve prolapse)   . Seasonal allergies   . Tubulovillous adenoma      Past Surgical History:  Procedure Laterality Date  . ABDOMINAL HYSTERECTOMY    . BREAST SURGERY  2010   biopsy-benign  . BUNIONECTOMY    .  COLONOSCOPY    . FUNCTIONAL ENDOSCOPIC SINUS SURGERY  08/28/08   with bilateral ethmoidectomies and bilateral maxillary sinus ostial enlargement with removal of mucocyst, mucous membrane, and mucoid material  . KNEE ARTHROSCOPY W/ LATERAL RELEASE  11/17/99   Left  . KNEE ARTHROSCOPY W/ PARTIAL MEDIAL MENISCECTOMY  11/17/99   left  . Laparoscopic Right Colectomy  01/25/03  . nasal polyp removal    . POLYPECTOMY    . TONSILLECTOMY  1952   Family History  Problem Relation Age of Onset  . Hypertension Son   . Lung cancer Father   . Cancer Father   . Hypertension Father   . Liver cancer Mother   . Cancer Mother   . Hypertension Mother   . Colon cancer Mother   . Colon cancer Sister   . Heart murmur Son   . Stomach cancer Neg Hx   . Ulcerative colitis Neg Hx   . Allergic rhinitis Neg Hx   . Angioedema Neg Hx   . Asthma Neg Hx    Social History   Tobacco Use  . Smoking status: Former Smoker    Last attempt to quit: 09/09/1977    Years since quitting: 39.5  . Smokeless tobacco: Never Used  . Tobacco comment: used to smoke socially. Quit in 1979.  Substance Use Topics  . Alcohol use: Yes    Comment:  usually has a glass of wine with dinner every evening  . Drug use: No   Current Outpatient Medications  Medication Sig Dispense Refill  . acetaminophen (TYLENOL ARTHRITIS PAIN) 650 MG CR tablet Take 650 mg by mouth every 8 (eight) hours as needed for pain.    Marland Kitchen albuterol (PROAIR HFA) 108 (90 Base) MCG/ACT inhaler INHALE TWO PUFFS EVERY 4 HOURS AS NEEDED FOR COUGH OR WHEEZE. 8.5 Inhaler 0  . amLODipine (NORVASC) 5 MG tablet Take 1 tablet (5 mg total) by mouth daily. 90 tablet 1  . aspirin 81 MG EC tablet Take 81 mg by mouth daily.      . Beclomethasone Dipropionate (QNASL) 80 MCG/ACT AERS ONE SPRAY EACH NOSTRIL TWICE DAILY FOR STUFFY NOSE OR DRAINAGE. 1 Inhaler 4  . budesonide (PULMICORT) 0.5 MG/2ML nebulizer solution Mix budesonide/saline nasal irrigation twice a day. 360 mL 1  .  Calcium Carb-Cholecalciferol (CALCIUM 600+D3 PO) Take 600 mg by mouth 2 (two) times daily.     . carvedilol (COREG) 12.5 MG tablet TAKE ONE TABLET BY MOUTH TWICE DAILY WITH A MEAL 60 tablet 6  . conjugated estrogens (PREMARIN) vaginal cream Place 0.5 g vaginally 2 (two) times a week. As needed for spotting    . Cyanocobalamin (VITAMIN B-12) 2500 MCG SUBL Place under the tongue.    . fluticasone (FLOVENT HFA) 110 MCG/ACT inhaler Inhale 2 puffs into the lungs 2 (two) times daily. 1 Inhaler 1  . fluticasone (FLOVENT HFA) 220 MCG/ACT inhaler Inhale 2 puffs 2 (two) times daily into the lungs. 1 Inhaler 5  . FOLBIC 2.5-25-2 MG TABS tablet TAKE 1 TABLET BY MOUTH DAILY 30 tablet 4  . ibuprofen (ADVIL,MOTRIN) 800 MG tablet TAKE 1 TABLET (800 MG TOTAL) BY MOUTH 3 (THREE) TIMES DAILY AS NEEDED FOR PAIN 90 tablet 1  . KLOR-CON M20 20 MEQ tablet TAKE 1 TABLET BY MOUTH EVERY DAY 90 tablet 3  . LORazepam (ATIVAN) 1 MG tablet TAKE 1 TABLET BY MOUTH TWICE A DAY 60 tablet 1  . losartan (COZAAR) 50 MG tablet TAKE 1 TABLET BY MOUTH EVERY DAY 90 tablet 3  . montelukast (SINGULAIR) 10 MG tablet TAKE 1 TABLET BY MOUTH EVERY DAY 30 tablet 0  . Multiple Vitamins-Minerals (CENTRUM) tablet Take 1 tablet by mouth daily.      . nitroGLYCERIN (NITROSTAT) 0.4 MG SL tablet AS DIRECTED (Patient taking differently: Place 0.4 mg under the tongue. For chest pain (patient unsure of MAX doses allowed)) 25 tablet 0  . omeprazole (PRILOSEC) 20 MG capsule TAKE 1 CAPSULE (20 MG TOTAL) BY MOUTH 2 (TWO) TIMES DAILY. (Patient taking differently: Take 20 mg by mouth daily. TAKE 1 CAPSULE (20 MG TOTAL) BY MOUTH 2 (TWO) TIMES DAILY.) 180 capsule 1  . spironolactone (ALDACTONE) 25 MG tablet TAKE 1 TABLET (25 MG TOTAL) BY MOUTH DAILY. PLEASE KEEP 01/11/17 APPT FOR FURHTER REFILLS 30 tablet 10  . traMADol (ULTRAM) 50 MG tablet Take 50 mg by mouth every 6 (six) hours as needed. Reported on 07/23/2015  0   Current Facility-Administered Medications    Medication Dose Route Frequency Provider Last Rate Last Dose  . predniSONE (DELTASONE) tablet 10 mg  10 mg Oral Q breakfast Bobbitt, [place] Muta, MD       No Known Allergies   Review of Systems: All systems reviewed and negative except where noted in HPI.   Lab Results  Component Value Date   WBC 7.2 02/03/2017   HGB 12.8 02/03/2017   HCT 38.7 02/03/2017  MCV 95.0 02/03/2017   PLT 332.0 02/03/2017    Lab Results  Component Value Date   CREATININE 0.69 02/03/2017   BUN 24 (H) 02/03/2017   NA 135 02/03/2017   K 4.9 02/03/2017   CL 103 02/03/2017   CO2 27 02/03/2017    Lab Results  Component Value Date   ALT 14 02/03/2017   AST 16 02/03/2017   ALKPHOS 71 02/03/2017   BILITOT 0.5 02/03/2017     Physical Exam: BP 112/78   Pulse 80   Ht 5\' 1"  (1.549 m)   Wt 119 lb (54 kg)   BMI 22.48 kg/m  Constitutional: Pleasant,well-developed, female in no acute distress. HEENT: Normocephalic and atraumatic. Conjunctivae are normal. No scleral icterus. Neck supple.  Cardiovascular: Normal rate, regular rhythm.  Pulmonary/chest: Effort normal and breath sounds normal. No wheezing, rales or rhonchi. Abdominal: Soft, nondistended, nontender. There are no masses palpable. No hepatomegaly. Extremities: no edema Lymphadenopathy: No cervical adenopathy noted. Neurological: Alert and oriented to person place and time. Skin: Skin is warm and dry. No rashes noted. Psychiatric: Normal mood and affect. Behavior is normal.   ASSESSMENT AND PLAN: 76 year old female with a strong family history of colon cancer (mother and sister diagnosed age 29s), here to discuss colon cancer screening. I discussed tri-service screening guidelines with her. She has no other FH of malignancies, does not meet criteria for Lynch syndrome. Her last colonoscopy in 2015 showed no adenomas, and had a good bowel preparation. She has no blood in her stools and has no anemia. She had negative FOBT testing in 2016.  If she wishes to continue colon cancer screening with colonoscopy, recommend a colonoscopy 5 years from her last exam, thus due December 2020, per guidelines. If she has change in bowel function, bleeding, anemia, etc. in the interim we can consider doing it sooner. She feels strongly about wishing to continue colon cancer screening in 2 years pending she is otherwise stable for this. All questions answered she can follow-up with me as needed otherwise.  Leo-Cedarville Cellar, MD [facility] Pager [phone]

## 2017-04-01 NOTE — Telephone Encounter (Signed)
L/M for Pt to call back to see if she is taking QNASL or budesonide/saline rinses.

## 2017-04-05 NOTE — Telephone Encounter (Signed)
Lm for pt to call us back  

## 2017-04-06 NOTE — Telephone Encounter (Signed)
Letter mailed

## 2017-04-07 NOTE — Telephone Encounter (Signed)
Spoke to patient and she stated that she is using both the Qnasl and budesonide/saline daily. Please advise if you want to change her course of treatment.

## 2017-04-07 NOTE — Telephone Encounter (Signed)
Noted. Thanks.

## 2017-04-07 NOTE — Telephone Encounter (Signed)
Spoke to patient and she stated that she will use the budesonide/saline and stop using the Qnasl.

## 2017-04-07 NOTE — Telephone Encounter (Signed)
She should only be on one of these. Please find out which she prefers, which is less expensive to her, etc. They both are good options, but she only needs to be on one of them. Thanks.

## 2017-04-13 ENCOUNTER — Other Ambulatory Visit: Payer: Self-pay | Admitting: Allergy and Immunology

## 2017-04-28 ENCOUNTER — Other Ambulatory Visit: Payer: Self-pay | Admitting: Allergy and Immunology

## 2017-05-02 ENCOUNTER — Other Ambulatory Visit: Payer: Self-pay | Admitting: Allergy and Immunology

## 2017-05-06 ENCOUNTER — Telehealth: Payer: Self-pay | Admitting: Family Medicine

## 2017-05-06 NOTE — Telephone Encounter (Signed)
Spoke with Cristie Hem at [facility] and advised that Per PCP it is okay to fill medication.

## 2017-05-06 NOTE — Telephone Encounter (Signed)
Pt has been receiving Lorazepam from me for years and receiving Tramadol from Orthoped[facility] for years.  Aware of possible interaction but pt has been taking these w/o difficulty for years

## 2017-05-06 NOTE — Telephone Encounter (Signed)
Call to [facility] - Tramadol- Ativan Warning: Respiratory depression   Insurance is making them call to get an ok from provider to fill due to interaction warning. Please have provider make provider aware and have them call with OK to fill.

## 2017-05-06 NOTE — Telephone Encounter (Signed)
Copied from [place] [id number]. Topic: Inquiry >> May 06, 2017 12:20 PM Neva Seat wrote:  [facility] - [address] - [phone]  Is needing to discuss pt's medication interactions.

## 2017-05-16 ENCOUNTER — Telehealth: Payer: Self-pay | Admitting: Allergy and Immunology

## 2017-05-16 NOTE — Telephone Encounter (Signed)
Please advise 

## 2017-05-16 NOTE — Telephone Encounter (Signed)
Continue with budesonide in the saline rinse. Thanks.

## 2017-05-16 NOTE — Telephone Encounter (Signed)
Patient called with questions about her medications. She wants to know if she is to continue with the saline rinse and the budesonide, or just the saline rinse?

## 2017-05-17 NOTE — Telephone Encounter (Signed)
Pt advised.

## 2017-05-24 ENCOUNTER — Encounter: Payer: Self-pay | Admitting: Allergy and Immunology

## 2017-05-24 ENCOUNTER — Ambulatory Visit: Payer: Medicare Other | Admitting: Allergy and Immunology

## 2017-05-24 VITALS — BP 110/70 | HR 78 | Temp 98.4°F | Resp 16

## 2017-05-24 DIAGNOSIS — J4551 Severe persistent asthma with (acute) exacerbation: Secondary | ICD-10-CM

## 2017-05-24 DIAGNOSIS — J3089 Other allergic rhinitis: Secondary | ICD-10-CM

## 2017-05-24 DIAGNOSIS — R490 Dysphonia: Secondary | ICD-10-CM

## 2017-05-24 MED ORDER — TIOTROPIUM BROMIDE MONOHYDRATE 1.25 MCG/ACT IN AERS: 2.0000 | INHALATION_SPRAY | Freq: Every day | RESPIRATORY_TRACT | 5 refills | Status: DC

## 2017-05-24 MED ORDER — PREDNISONE 1 MG PO TABS: 10.0000 mg | ORAL_TABLET | Freq: Every day | ORAL | Status: AC

## 2017-05-24 NOTE — Progress Notes (Signed)
Follow-up Note  RE: BRICIA TAHER MRN: [id number] DOB: 1941/02/03 Date of Office Visit: 05/24/2017  Primary care provider: Midge Minium, MD Referring provider: Midge Minium, MD  History of present illness: Amanda Jackson is a 77 y.o. female with persistent asthma, allergic rhinitis, gastroesophageal reflux, and history of nasal polyps presenting today for sick visit.  She was last seen in this clinic on March 29, 2017.  Over the past wheezing, coughing, and dyspnea at rest and with mild exertion.  In addition, she is expectorating thick mucus.  She has been experiencing asthma symptoms throughout the day over the past 1 or 2 weeks.  She denies sinus pressure, fevers, and chills.  She believes that her asthma has been exacerbated by the rapid weather changes recently.  She was seen by Dr. Ernesto Rutherford on May 17, 2017 and during that visit indirect laryngoscopy revealed mild posterior vocal nodules.  He believes that her hoarseness may be related to the use of asthma controller inhalers.  She is currently taking Flovent 220 g, 2 inhalations via spacer device twice daily, montelukast daily bedtime, and albuterol when needed.  During her last visit she was instructed to use budesonide saline rinses, however he misunderstood and has been using the budesonide/steroid rinses twice a day as well as Qnasl once or twice daily.    Assessment and plan: Asthma with acute exacerbation  Prednisone has been provided, 20 mg x 4 days, 10 mg x1 day, then stop.  A sample and prescription have been provided for Spiriva Respimat 1.25 g, 2 inhalations daily.  For now, and during respiratory tract infections or asthma flares, increase Flovent 220g to 3 inhalations 2 times per day.  Once his symptoms have returned to baseline, resume 2 inhalations via spacer device twice daily.    Continue montelukast 10 mg daily bedtime and albuterol HFA, 1-2 inhalations every 6 hours if needed.  The patient  has been asked to contact me if her symptoms persist or progress. Otherwise, she may return for follow up in 4 months.  Allergic rhinitis  Continue appropriate allergen avoidance measures.  Continue budesonide/saline nasal irrigation twice a day.    Discontinue Qnasl.  For thick post nasal drainage, add guaifenesin 600 mg (Mucinex)  twice daily as needed with adequate hydration as discussed.  Hoarseness of voice  As the use of inhaled corticosteroids may be contributing to this problem, despite the fact that she is correctly using the inhaler with a spacer device and rinsing her mouth, we have added Spiriva with the hopes of gradually decreasing her inhaled corticosteroid requirement.  If this problem persists or progresses despite these measures, she will be referred to Dr. Benjamine Mola for further evaluation.   Meds ordered this encounter  Medications  . Tiotropium Bromide Monohydrate (SPIRIVA RESPIMAT) 1.25 MCG/ACT AERS    Sig: Inhale 2 puffs into the lungs daily. To prevent coughing or wheezing    Dispense:  1 Inhaler    Refill:  5  . predniSONE (DELTASONE) tablet 10 mg    Diagnostics: Spirometry reveals an FVC of 1.52 L and an FEV1 of 1.11 L (58% predicted) without postbronchodilator improvement.  Please see scanned spirometry results for details.    Physical examination: Blood pressure 110/70, pulse 78, temperature 98.4 F (36.9 C), temperature source Oral, resp. rate 16, SpO2 95 %.  General: Alert, interactive, in no acute distress. HEENT: TMs pearly gray, turbinates moderately edematous with thick discharge, post-pharynx mildly erythematous. Neck: Supple without lymphadenopathy. Lungs:  Mildly decreased breath sounds bilaterally without wheezing, rhonchi or rales. CV: Normal S1, S2 without murmurs. Skin: Warm and dry, without lesions or rashes.  The following portions of the patient's history were reviewed and updated as appropriate: allergies, current medications, past  family history, past medical history, past social history, past surgical history and problem list.   Allergies as of 05/24/2017   No Known Allergies     Medication List        Accurate as of 05/24/17  5:43 PM. Always use your most recent med list.          albuterol 108 (90 Base) MCG/ACT inhaler Commonly known as:  PROVENTIL HFA;VENTOLIN HFA INHALE TWO PUFFS EVERY 4 HOURS AS NEEDED FOR COUGH OR WHEEZE.   amLODipine 5 MG tablet Commonly known as:  NORVASC Take 1 tablet (5 mg total) by mouth daily.   aspirin 81 MG EC tablet Take 81 mg by mouth daily.   Beclomethasone Dipropionate 80 MCG/ACT Aers Commonly known as:  QNASL ONE SPRAY EACH NOSTRIL TWICE DAILY FOR STUFFY NOSE OR DRAINAGE.   budesonide 0.5 MG/2ML nebulizer solution Commonly known as:  PULMICORT Mix budesonide/saline nasal irrigation twice a day.   CALCIUM 600+D3 PO Take 600 mg by mouth 2 (two) times daily.   carvedilol 12.5 MG tablet Commonly known as:  COREG TAKE ONE TABLET BY MOUTH TWICE DAILY WITH A MEAL   CENTRUM tablet Take 1 tablet by mouth daily.   fluticasone 220 MCG/ACT inhaler Commonly known as:  FLOVENT HFA Inhale 2 puffs 2 (two) times daily into the lungs.   FOLBIC 2.5-25-2 MG Tabs tablet Generic drug:  folic acid-pyridoxine-cyancobalamin TAKE 1 TABLET BY MOUTH DAILY   ibuprofen 800 MG tablet Commonly known as:  ADVIL,MOTRIN TAKE 1 TABLET (800 MG TOTAL) BY MOUTH 3 (THREE) TIMES DAILY AS NEEDED FOR PAIN   KLOR-CON M20 20 MEQ tablet Generic drug:  potassium chloride SA TAKE 1 TABLET BY MOUTH EVERY DAY   LORazepam 1 MG tablet Commonly known as:  ATIVAN TAKE 1 TABLET BY MOUTH TWICE A DAY   losartan 50 MG tablet Commonly known as:  COZAAR TAKE 1 TABLET BY MOUTH EVERY DAY   montelukast 10 MG tablet Commonly known as:  SINGULAIR TAKE 1 TABLET BY MOUTH EVERY DAY   nitroGLYCERIN 0.4 MG SL tablet Commonly known as:  NITROSTAT AS DIRECTED   omeprazole 20 MG capsule Commonly known  as:  PRILOSEC TAKE 1 CAPSULE (20 MG TOTAL) BY MOUTH 2 (TWO) TIMES DAILY.   spironolactone 25 MG tablet Commonly known as:  ALDACTONE TAKE 1 TABLET (25 MG TOTAL) BY MOUTH DAILY. PLEASE KEEP 01/11/17 APPT FOR FURHTER REFILLS   Tiotropium Bromide Monohydrate 1.25 MCG/ACT Aers Commonly known as:  SPIRIVA RESPIMAT Inhale 2 puffs into the lungs daily. To prevent coughing or wheezing   traMADol 50 MG tablet Commonly known as:  ULTRAM Take 50 mg by mouth every 6 (six) hours as needed. Reported on 07/23/2015   triamcinolone cream 0.1 % Commonly known as:  KENALOG 1 APPLICATION TO AFFECTED AREA TWICE A DAY EXTERNALLY 14 DAYS   TYLENOL ARTHRITIS PAIN 650 MG CR tablet Generic drug:  acetaminophen Take 650 mg by mouth every 8 (eight) hours as needed for pain.   Vitamin B-12 2500 MCG Subl Place under the tongue.       No Known Allergies  Review of systems: Review of systems negative except as noted in HPI / PMHx or noted below: Constitutional: Negative.  HENT: Negative.  Eyes: Negative.  Respiratory: Negative.   Cardiovascular: Negative.  Gastrointestinal: Negative.  Genitourinary: Negative.  Musculoskeletal: Negative.  Neurological: Negative.  Endo/Heme/Allergies: Negative.  Cutaneous: Negative.  Past Medical History:  Diagnosis Date  . Allergy   . Anxiety   . Arthritis   . Asthma   . Atypical chest pain    Normal Myoview 2008  . Colon polyp   . GERD (gastroesophageal reflux disease)   . Hx of colonoscopy 03/18/09  . Hyperlipidemia   . Hypertension   . Left nasal polyps   . MVP (mitral valve prolapse)   . Seasonal allergies   . Tubulovillous adenoma     Family History  Problem Relation Age of Onset  . Hypertension Son   . Lung cancer Father   . Cancer Father   . Hypertension Father   . Liver cancer Mother   . Cancer Mother   . Hypertension Mother   . Colon cancer Mother   . Colon cancer Sister   . Heart murmur Son   . Stomach cancer Neg Hx   . Ulcerative  colitis Neg Hx   . Allergic rhinitis Neg Hx   . Angioedema Neg Hx   . Asthma Neg Hx     Social History   Socioeconomic History  . Marital status: Divorced    Spouse name: Not on file  . Number of children: Not on file  . Years of education: Not on file  . Highest education level: Not on file  Social Needs  . Financial resource strain: Not on file  . Food insecurity - worry: Not on file  . Food insecurity - inability: Not on file  . Transportation needs - medical: Not on file  . Transportation needs - non-medical: Not on file  Occupational History  . Occupation: Retired    Comment: English Professor  Tobacco Use  . Smoking status: Former Smoker    Last attempt to quit: 09/09/1977    Years since quitting: 39.7  . Smokeless tobacco: Never Used  . Tobacco comment: used to smoke socially. Quit in 1979.  Substance and Sexual Activity  . Alcohol use: Yes    Comment: usually has a glass of wine with dinner every evening  . Drug use: No  . Sexual activity: Not Currently    Birth control/protection: Other-see comments    Comment: HYST  Other Topics Concern  . Not on file  Social History Narrative  . Not on file    I appreciate the opportunity to take part in Cielle's care. Please do not hesitate to contact me with questions.  Sincerely,   R. Edgar Frisk, MD

## 2017-05-24 NOTE — Assessment & Plan Note (Signed)
   Continue appropriate allergen avoidance measures.  Continue budesonide/saline nasal irrigation twice a day.    Discontinue Qnasl.  For thick post nasal drainage, add guaifenesin 600 mg (Mucinex)  twice daily as needed with adequate hydration as discussed.

## 2017-05-24 NOTE — Assessment & Plan Note (Signed)
   As the use of inhaled corticosteroids may be contributing to this problem, despite the fact that she is correctly using the inhaler with a spacer device and rinsing her mouth, we have added Spiriva with the hopes of gradually decreasing her inhaled corticosteroid requirement.  If this problem persists or progresses despite these measures, she will be referred to Dr. Benjamine Mola for further evaluation.

## 2017-05-24 NOTE — Assessment & Plan Note (Signed)
   Prednisone has been provided, 20 mg x 4 days, 10 mg x1 day, then stop.  A sample and prescription have been provided for Spiriva Respimat 1.25 g, 2 inhalations daily.  For now, and during respiratory tract infections or asthma flares, increase Flovent 220g to 3 inhalations 2 times per day.  Once his symptoms have returned to baseline, resume 2 inhalations via spacer device twice daily.    Continue montelukast 10 mg daily bedtime and albuterol HFA, 1-2 inhalations every 6 hours if needed.  The patient has been asked to contact me if her symptoms persist or progress. Otherwise, she may return for follow up in 4 months.

## 2017-05-24 NOTE — Patient Instructions (Addendum)
Asthma with acute exacerbation  Prednisone has been provided, 20 mg x 4 days, 10 mg x1 day, then stop.  A sample and prescription have been provided for Spiriva Respimat 1.25 g, 2 inhalations daily.  For now, and during respiratory tract infections or asthma flares, increase Flovent 220g to 3 inhalations 2 times per day.  Once his symptoms have returned to baseline, resume 2 inhalations via spacer device twice daily.    Continue montelukast 10 mg daily bedtime and albuterol HFA, 1-2 inhalations every 6 hours if needed.  The patient has been asked to contact me if her symptoms persist or progress. Otherwise, she may return for follow up in 4 months.  Allergic rhinitis  Continue appropriate allergen avoidance measures.  Continue budesonide/saline nasal irrigation twice a day.    Discontinue Qnasl.  For thick post nasal drainage, add guaifenesin 600 mg (Mucinex)  twice daily as needed with adequate hydration as discussed.  Hoarseness of voice  As the use of inhaled corticosteroids may be contributing to this problem, despite the fact that she is correctly using the inhaler with a spacer device and rinsing her mouth, we have added Spiriva with the hopes of gradually decreasing her inhaled corticosteroid requirement.  If this problem persists or progresses despite these measures, she will be referred to Dr. Benjamine Mola for further evaluation.   Return in about 4 months (around 09/21/2017), or if symptoms worsen or fail to improve.

## 2017-06-02 ENCOUNTER — Encounter: Payer: Self-pay | Admitting: Allergy & Immunology

## 2017-06-02 ENCOUNTER — Ambulatory Visit: Payer: Medicare Other | Admitting: Allergy & Immunology

## 2017-06-02 ENCOUNTER — Telehealth: Payer: Self-pay | Admitting: *Deleted

## 2017-06-02 VITALS — BP 100/66 | HR 81 | Resp 17 | Wt 119.8 lb

## 2017-06-02 DIAGNOSIS — J382 Nodules of vocal cords: Secondary | ICD-10-CM | POA: Diagnosis not present

## 2017-06-02 DIAGNOSIS — R490 Dysphonia: Secondary | ICD-10-CM

## 2017-06-02 DIAGNOSIS — T7840XA Allergy, unspecified, initial encounter: Secondary | ICD-10-CM | POA: Diagnosis not present

## 2017-06-02 MED ORDER — TRIAMCINOLONE ACETONIDE 0.1 % MT PSTE: 1.0000 "application " | PASTE | Freq: Two times a day (BID) | OROMUCOSAL | 1 refills | Status: DC

## 2017-06-02 NOTE — Patient Instructions (Addendum)
1. Allergic reaction, initial encounter - I would definitely stop the Spiriva since you seem to have reacted to it. - Start Allegra one tablet twice daily for five days. - Start triamcinolone orabase applied twice daily to the lesion on your lower lip for 1-2 weeks until the lesion has healed. - Give [place] an update early next week.  - Your lung function is better than last time, so I think your asthma is under good control.  2. Return in about 4 weeks (around 06/27/2017).   Please inform us of any Emergency Department visits, hospitalizations, or changes in symptoms. Call us before going to the ED for breathing or allergy symptoms since we might be able to fit you in for a sick visit. Feel free to contact us anytime with any questions, problems, or concerns.  It was a pleasure to meet you today! Happy New Year!   Websites that have reliable patient information: 1. American Academy of Asthma, Allergy, and Immunology: www.aaaai.org 2. Food Allergy Research and Education (FARE): foodallergy.org 3. Mothers of Asthmatics: http://www.asthmacommunitynetwork.org 4. American College of Allergy, Asthma, and Immunology: www.acaai.org

## 2017-06-02 NOTE — Progress Notes (Signed)
FOLLOW UP  Date of Service/Encounter:  06/02/17   Assessment:   Allergic reaction - ? Spiriva reaction  Plan/Recommendations:   1. Allergic reaction - secondary to Spiriva based on the time course - I would definitely stop the Spiriva since you seem to have reacted to it. - I have never seen a reaction such as this, however.  - Start Allegra one tablet twice daily for five days. - Start triamcinolone orabase applied twice daily to the lesion on your lower lip for 1-2 weeks until the lesion has healed. - I did ask her to give [place] an update early next week.  - Her lung function looked better than last time, so I do no think that her asthma is acting up at this point. - I deferred testing for HSV given the lack of history.  - However, if the lesions continued through next week despite the addition of the triamcinolone orabase, we may consider a further workup.  - Her hoarseness has continued from the last visit, therefore   2. Return in about 4 weeks (around 06/27/2017).  Subjective:   Amanda Jackson is a 77 y.o. female presenting today for follow up of  Chief Complaint  Patient presents with  . Asthma    Pt is hoarse. She thinks this is due to her Amanda Jackson has a history of the following: Patient Active Problem List   Diagnosis Date Noted  . Hoarseness of voice 05/24/2017  . Migraine 01/11/2017  . Insect bite 08/31/2016  . Persistent cough 11/24/2015  . Vasovagal syncope 10/15/2015  . Hypokalemia 08/22/2015  . Syncope 07/30/2015  . Asthma with acute exacerbation 03/25/2015  . Acute sinusitis 03/25/2015  . History of nasal polyps 03/25/2015  . Abdominal pain, left lower quadrant 11/04/2014  . Leg pain, posterior 11/05/2013  . Mild persistent asthma 05/08/2013  . Loss of weight 12/05/2012  . Abnormal TSH 10/11/2012  . GERD (gastroesophageal reflux disease) 04/12/2012  . Routine general medical examination at a health care facility 04/12/2012  .  Atypical chest pain   . Allergic rhinitis   . MVP (mitral valve prolapse)   . Tubulovillous adenoma   . Anxiety 04/12/2011  . Hx of adenomatous colonic polyps 04/12/2011  . Hepatic cyst 04/12/2011  . History of mitral valve prolapse 04/12/2011  . Hx of colonoscopy 03/18/2009  . Essential hypertension 01/17/2009  . PRECORDIAL PAIN 01/17/2009    History obtained from: chart review and patient.  Amanda Jackson's Primary Care Provider is Midge Minium, MD.     Amanda Jackson is a 77 y.o. female presenting for a sick visit. She was last seen by Dr. Verlin Fester at the end of January 2019. At that time, she was diagnosed with an asthma exacerbation. She was started on a a prednisone burst. He continued her on Flovent 237mcg two puffs up to TID. Spiriva was also added as a means of helping her breathing further. She was also continued on montelukast 10mg  once daily. She was changed from Qnasl to budesonide rinses twice daily in conjunction with Mucinex. 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 Ernesto Rutherford.   Since the last visit, she is concerned that she is developing an allergic reaction to the Spiriva. She has had three doses and then developed lip swelling as Jackson as a white lesion on the lower surface of the buccal mucosa. She felt that she might be  having an allergic reaction and was planning to go to the ED, however, she called [place] to give [place] an update. We recommended that she see [place] instead and we could manage it in the office instead of wasting time at the ED. She denies throat problems today and overall she is improving. She did not take any antihistamines prior to her arrival here, and she is refusing to take it ever again.   She has never had a similar reaction with regards to the oral lesion. She denies a history of HSV and denies ever having had a similar reaction at all. She has never had an adverse response to any kind of  medication. She did complete her prednisone and feels better from a breathing perspective.  She does have this long standing history of hoarseness with vocal nodules noted during a recent laryngoscopy. Dr. Ernesto Rutherford was not planning on doing surgery since she was not a singer, but since the hoarseness has continued, Dr. Verlin Fester offer a referral to see Dr. Benjamine Mola. She is interested in this referral today because the hoarseness has become such a problem.   Otherwise, there have been no changes to her past medical history, surgical history, family history, or social history.    Review of Systems: a 14-point review of systems is pertinent for what is mentioned in HPI.  Otherwise, all other systems were negative. Constitutional: negative other than that listed in the HPI Eyes: negative other than that listed in the HPI Ears, nose, mouth, throat, and face: negative other than that listed in the HPI Respiratory: negative other than that listed in the HPI Cardiovascular: negative other than that listed in the HPI Gastrointestinal: negative other than that listed in the HPI Genitourinary: negative other than that listed in the HPI Integument: negative other than that listed in the HPI Hematologic: negative other than that listed in the HPI Musculoskeletal: negative other than that listed in the HPI Neurological: negative other than that listed in the HPI Allergy/Immunologic: negative other than that listed in the HPI    Objective:   Blood pressure 100/66, pulse 81, resp. rate 17, weight 119 lb 12.8 oz (54.3 kg), SpO2 95 %. Body mass index is 22.64 kg/m.   Physical Exam:  General: Alert, interactive, in no acute distress. Very pleasant thin female.  Eyes: No conjunctival injection bilaterally, no discharge on the right, no discharge on the left and no Horner-Trantas dots present. PERRL bilaterally. EOMI without pain. No photophobia.  Ears: Right TM pearly gray with normal light reflex, Left TM  pearly gray with normal light reflex, Right TM intact without perforation and Left TM intact without perforation.  Nose/Throat: External nose within normal limits, nasal crease present and septum midline. Turbinates minimally edematous without discharge. Posterior oropharynx mildly erythematous without cobblestoning in the posterior oropharynx. Tonsils 2+ without exudates.  circular white lesions in the midline buccal mucosa on the bottom lip and Tongue without thrush. Adenopathy: no enlarged lymph nodes appreciated in the anterior cervical, occipital, axillary, epitrochlear, inguinal, or popliteal regions. Lungs: Clear to auscultation without wheezing, rhonchi or rales. No increased work of breathing. CV: Normal S1/S2. No murmurs. Capillary refill <2 seconds.  Skin: Warm and dry, without lesions or rashes. Neuro:   Grossly intact. No focal deficits appreciated. Responsive to questions.  Diagnostic studies: none      Salvatore Marvel, MD [facility]

## 2017-06-02 NOTE — Telephone Encounter (Signed)
Patient called office and stated her lip and face is swelling and she believes it is from [employer] she started on 05/24/17.  Patient called to inform office she was going to [facility] ED.  Patient was instructed to take 2 Benadryl and get a driver and come to office now to be seen.

## 2017-06-02 NOTE — Addendum Note (Signed)
Addended by: Valentina Shaggy on: 06/02/2017 04:25 PM   Modules accepted: Orders

## 2017-06-03 NOTE — Progress Notes (Signed)
Faxed referral and demographic information to [facility] office They will contact the patient to set up an appt

## 2017-06-07 ENCOUNTER — Telehealth: Payer: Self-pay

## 2017-06-07 ENCOUNTER — Other Ambulatory Visit: Payer: Self-pay | Admitting: Family Medicine

## 2017-06-07 NOTE — Telephone Encounter (Signed)
Last OV 02/03/17 Lorazepam last filled 03/07/17 #60 with 1

## 2017-06-07 NOTE — Telephone Encounter (Signed)
Pt called to provide update, Pt states her swelling has resolved and also wanted to provide [place] with the Provider's name that she saw in the past. The provider was Dr Benjamine Mola.

## 2017-06-07 NOTE — Telephone Encounter (Signed)
Noted. Thanks.

## 2017-06-09 ENCOUNTER — Telehealth: Payer: Self-pay | Admitting: Allergy and Immunology

## 2017-06-09 ENCOUNTER — Other Ambulatory Visit: Payer: Self-pay | Admitting: Allergy and Immunology

## 2017-06-09 NOTE — Telephone Encounter (Signed)
I left a very detailed message for the patient advising her not to use both of these inhalers because they are essentially the same medication.

## 2017-06-09 NOTE — Telephone Encounter (Signed)
Pt called and said that she has proair and proventil and wants to know is she suppose to use both. [phone].

## 2017-06-18 ENCOUNTER — Other Ambulatory Visit: Payer: Self-pay | Admitting: Family Medicine

## 2017-06-27 ENCOUNTER — Ambulatory Visit: Payer: Medicare Other | Admitting: Allergy and Immunology

## 2017-06-28 ENCOUNTER — Telehealth: Payer: Self-pay | Admitting: Cardiology

## 2017-06-28 NOTE — Telephone Encounter (Signed)
New message   *STAT* If patient is at the pharmacy, call can be transferred to refill team.   1. Which medications need to be refilled? (please list name of each medication and dose if known) nitroGLYCERIN (NITROSTAT) 0.4 MG SL tablet  2. Which pharmacy/location (including street and city if local pharmacy) is medication to be sent to? AS DIRECTED Patient taking differently: Place 0.4 mg under the tongue. For chest pain (patient unsure of MAX doses allowed)  3. Do they need a 30 day or 90 day supply?

## 2017-06-28 NOTE — Telephone Encounter (Signed)
LMOM for patient to return my call.

## 2017-06-28 NOTE — Telephone Encounter (Signed)
PCP has always filled this for the patient but Dr Meda Coffee never has. Okay to refill? Please advise. Thanks, MI

## 2017-06-28 NOTE — Telephone Encounter (Signed)
Please refer to the pts original ordering Provider, and if they refuse, then we should route this message to Dr Meda Coffee to review and advise on refill request.

## 2017-06-29 ENCOUNTER — Other Ambulatory Visit: Payer: Self-pay | Admitting: General Practice

## 2017-06-29 MED ORDER — NITROGLYCERIN 0.4 MG SL SUBL: 0.4000 mg | SUBLINGUAL_TABLET | SUBLINGUAL | 1 refills | Status: DC | PRN

## 2017-06-29 NOTE — Telephone Encounter (Signed)
Called and spoke with patient to advise that this prescription was filled per a verbal from PCP. Pt was very hoarse complaining of bronchitis, reflux, SOB, and some chest pain. Pt stated that she had taken a nitro pill 2 days ago and did not go to the ER. Pt was advised that the pills were 77 years old and may not be effective.    Pt stated that she had her companion in the home and stated that if the SOB and chest pain worsened she would seek treatment at the ER. Will pick up new nitro prescription tonight in case she needs them.

## 2017-06-29 NOTE — Telephone Encounter (Signed)
Patient said Dr. Meda Coffee said Dr. Birdie Riddle wrote the prescription for nitroGLYCERIN (NITROSTAT) 0.4 MG SL tablet and she needs that prescription filled and sent to her preferred pharmacy at [facility] on [address].  [phone]

## 2017-06-29 NOTE — Telephone Encounter (Signed)
Patient returned my call and I have made her aware of message below. She looked at her bottle and saw that it was written by Dr Birdie Riddle, she apologized and stated that she will contact her office. She is aware to call our office back if she has any problem obtaining the refills from Dr Birdie Riddle.

## 2017-06-29 NOTE — Telephone Encounter (Signed)
LMOM for patient to return my call.

## 2017-06-29 NOTE — Telephone Encounter (Signed)
Agree w/ advice given

## 2017-06-30 ENCOUNTER — Encounter: Payer: Self-pay | Admitting: Allergy & Immunology

## 2017-06-30 ENCOUNTER — Ambulatory Visit: Payer: Medicare Other | Admitting: Allergy & Immunology

## 2017-06-30 VITALS — BP 90/66 | HR 96 | Resp 16

## 2017-06-30 DIAGNOSIS — J3801 Paralysis of vocal cords and larynx, unilateral: Secondary | ICD-10-CM | POA: Insufficient documentation

## 2017-06-30 DIAGNOSIS — J4550 Severe persistent asthma, uncomplicated: Secondary | ICD-10-CM | POA: Diagnosis not present

## 2017-06-30 DIAGNOSIS — R490 Dysphonia: Secondary | ICD-10-CM

## 2017-06-30 DIAGNOSIS — J382 Nodules of vocal cords: Secondary | ICD-10-CM

## 2017-06-30 NOTE — Patient Instructions (Addendum)
Persistent asthma, uncomplicated - Continue with Flovent three puffs twice daily with spacer.  - Continue montelukast 10 mg daily bedtime and albuterol HFA, 1-2 inhalations every 6 hours if needed. - Lung function looked worse today, but your lungs sounded clear, so I think this is all related to the vocal cord paralysis.  Allergic rhinitis - Continue budesonide/saline nasal irrigation twice a day.   - For thick post nasal drainage, add guaifenesin 600 mg (Mucinex)  twice daily as needed with adequate hydration.  Hoarseness of voice - Continue with follow up with Dr. Joya Gaskins.  - We will forward the note to Dr. Joya Gaskins to keep him up to date.   Return in about 3 months (around 09/30/2017).    Please inform us of any Emergency Department visits, hospitalizations, or changes in symptoms. Call us before going to the ED for breathing or allergy symptoms since we might be able to fit you in for a sick visit. Feel free to contact us anytime with any questions, problems, or concerns.  It was a pleasure to see you again today!  Websites that have reliable patient information: 1. American Academy of Asthma, Allergy, and Immunology: www.aaaai.org 2. Food Allergy Research and Education (FARE): foodallergy.org 3. Mothers of Asthmatics: http://www.asthmacommunitynetwork.org 4. American College of Allergy, Asthma, and Immunology: www.acaai.org

## 2017-06-30 NOTE — Progress Notes (Signed)
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  - Continue montelukast 10 mg daily bedtime and albuterol HFA, 1-2 inhalations every 6 hours if needed. - Lung function looked worse today, but your lungs sounded clear, so I think this is all related to the vocal cord paralysis. - Once the vocal cord paralysis is addressed and resolves, we could consider stepping down her asthma therapy.   Allergic rhinitis - Continue budesonide/saline nasal irrigation twice a day.   - For thick post nasal drainage, add guaifenesin 600 mg (Mucinex)  twice daily as needed with adequate hydration.  Hoarseness of voice - Continue with follow up with Dr. Joya Gaskins.  - We will forward the note to Dr. Joya Gaskins to keep him up to date.   Return in about 3 months (around 09/30/2017).  Subjective:   Amanda Jackson is a 77 y.o. female presenting today for follow up of  Chief Complaint  Patient presents with  . Asthma    still some hoarseness.   . Allergic Reaction    no problems since last visit.     Amanda Jackson has a history of the following: Patient Active Problem List   Diagnosis Date Noted  . 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 Hoarseness of voice 05/24/2017  . Migraine 01/11/2017  . Insect bite 08/31/2016  . Persistent cough 11/24/2015  . Vasovagal syncope 10/15/2015  . Hypokalemia 08/22/2015  . Syncope 07/30/2015  . Asthma with acute exacerbation 03/25/2015  . Acute sinusitis 03/25/2015  . History of nasal polyps 03/25/2015  . Abdominal pain, left lower quadrant 11/04/2014  . Leg pain, posterior 11/05/2013  . Mild persistent asthma  05/08/2013  . Loss of weight 12/05/2012  . Abnormal TSH 10/11/2012  . GERD (gastroesophageal reflux disease) 04/12/2012  . Routine general medical examination at a health care facility 04/12/2012  . Atypical chest pain   . Allergic rhinitis   . MVP (mitral valve prolapse)   . Tubulovillous adenoma   . Anxiety 04/12/2011  . Hx of adenomatous colonic polyps 04/12/2011  . Hepatic cyst 04/12/2011  . History of mitral valve prolapse 04/12/2011  . Hx of colonoscopy 03/18/2009  . Essential hypertension 01/17/2009  . PRECORDIAL PAIN 01/17/2009    History obtained from: chart review and patient.  Amanda Jackson's Primary Care Provider is Midge Minium, MD.     Amanda Jackson is a 77 y.o. female presenting for a follow up visit. She was last seen one month ago. At that time, she was endorsing lip lesions following initiation of the Spiriva Respimat. We added on Allegra one tablet daily and added on triamcinolone orabase BID.   Since the last visit, she has done well. She never used the Spiriva again. She is very thankful for the triamcinolone Orabase and feels that this helped resolve the lesion in her mouth. Breathing has remained stable. She continues on the Flovent two puffs BID with good results. She does endorse shortness of breath since the last visit, but she thinks that this is all related to her vocal cord paralysis.  In the interim, she did make an appointment with Dr. Benjamine Mola, but she could not get  in to see him until the middle of March. Therefore she made some calls and ended up going to [facility] to see Dr. Addison Bailey at the end of February. At that time, he did a nasal endoscopy and looked at her vocal cords while making her make different sounds. It turns out that she has a left vocal cord paralysis for unknown reasons. She is scheduled now for a CT of her neck in mid March so that they can evaluate the recurrent laryngeal nerve to see if there is something that is causing the  paralysis.   She is otherwise doing quite well and is in a good mood today. Otherwise, there have been no changes to her past medical history, surgical history, family history, or social history.    Review of Systems: a 14-point review of systems is pertinent for what is mentioned in HPI.  Otherwise, all other systems were negative. Constitutional: negative other than that listed in the HPI Eyes: negative other than that listed in the HPI Ears, nose, mouth, throat, and face: negative other than that listed in the HPI Respiratory: negative other than that listed in the HPI Cardiovascular: negative other than that listed in the HPI Gastrointestinal: negative other than that listed in the HPI Genitourinary: negative other than that listed in the HPI Integument: negative other than that listed in the HPI Hematologic: negative other than that listed in the HPI Musculoskeletal: negative other than that listed in the HPI Neurological: negative other than that listed in the HPI Allergy/Immunologic: negative other than that listed in the HPI    Objective:   Blood pressure 90/66, pulse 96, resp. rate 16, SpO2 95 %. There is no height or weight on file to calculate BMI.   Physical Exam:  General: Alert, interactive, in no acute distress. Very talkative and pleasant female.  Eyes: No conjunctival injection bilaterally, no discharge on the right, no discharge on the left and no Horner-Trantas dots present. PERRL bilaterally. EOMI without pain. No photophobia.  Ears: Right TM pearly gray with normal light reflex, Left TM pearly gray with normal light reflex, Right TM intact without perforation and Left TM intact without perforation.  Nose/Throat: External nose within normal limits and septum midline. Turbinates minimally edematous without discharge. Posterior oropharynx mildly erythematous without cobblestoning in the posterior oropharynx. Tonsils 2+ without exudates.  Tongue without  thrush. Lungs: Clear to auscultation without wheezing, rhonchi or rales. No increased work of breathing. CV: Normal S1/S2. No murmurs. Capillary refill <2 seconds.  Skin: Warm and dry, without lesions or rashes. Neuro:   Grossly intact. No focal deficits appreciated. Responsive to questions.  Diagnostic studies:   Spirometry: results abnormal (FEV1: 0.65/46%, FVC: 0.81/44%, FEV1/FVC: 80%).    Spirometry consistent with possible restrictive disease. This is all likely secondary to her vocal cord paralysis, as her physical exam demonstrates excellent air movement throughout.    Allergy Studies: none      Salvatore Marvel, MD [facility]

## 2017-07-01 ENCOUNTER — Emergency Department (HOSPITAL_COMMUNITY): Payer: Medicare Other

## 2017-07-01 ENCOUNTER — Encounter (HOSPITAL_COMMUNITY): Payer: Self-pay | Admitting: *Deleted

## 2017-07-01 ENCOUNTER — Inpatient Hospital Stay (HOSPITAL_COMMUNITY)
Admission: EM | Admit: 2017-07-01 | Discharge: 2017-07-08 | DRG: 167 | Disposition: A | Discharge status: Home / Self Care | Payer: Medicare Other | Attending: Family Medicine | Admitting: Family Medicine

## 2017-07-01 ENCOUNTER — Other Ambulatory Visit: Payer: Self-pay

## 2017-07-01 DIAGNOSIS — J9811 Atelectasis: Secondary | ICD-10-CM | POA: Diagnosis present

## 2017-07-01 DIAGNOSIS — R918 Other nonspecific abnormal finding of lung field: Secondary | ICD-10-CM | POA: Diagnosis not present

## 2017-07-01 DIAGNOSIS — J3801 Paralysis of vocal cords and larynx, unilateral: Secondary | ICD-10-CM | POA: Diagnosis present

## 2017-07-01 DIAGNOSIS — Z90710 Acquired absence of both cervix and uterus: Secondary | ICD-10-CM

## 2017-07-01 DIAGNOSIS — Z800 Family history of malignant neoplasm of digestive organs: Secondary | ICD-10-CM

## 2017-07-01 DIAGNOSIS — C3412 Malignant neoplasm of upper lobe, left bronchus or lung: Secondary | ICD-10-CM | POA: Diagnosis not present

## 2017-07-01 DIAGNOSIS — Z66 Do not resuscitate: Secondary | ICD-10-CM | POA: Diagnosis present

## 2017-07-01 DIAGNOSIS — R490 Dysphonia: Secondary | ICD-10-CM | POA: Diagnosis present

## 2017-07-01 DIAGNOSIS — R079 Chest pain, unspecified: Secondary | ICD-10-CM | POA: Diagnosis present

## 2017-07-01 DIAGNOSIS — F419 Anxiety disorder, unspecified: Secondary | ICD-10-CM | POA: Diagnosis not present

## 2017-07-01 DIAGNOSIS — Z86010 Personal history of colonic polyps: Secondary | ICD-10-CM

## 2017-07-01 DIAGNOSIS — K219 Gastro-esophageal reflux disease without esophagitis: Secondary | ICD-10-CM | POA: Diagnosis present

## 2017-07-01 DIAGNOSIS — I10 Essential (primary) hypertension: Secondary | ICD-10-CM | POA: Diagnosis not present

## 2017-07-01 DIAGNOSIS — I771 Stricture of artery: Secondary | ICD-10-CM | POA: Diagnosis present

## 2017-07-01 DIAGNOSIS — R6889 Other general symptoms and signs: Secondary | ICD-10-CM

## 2017-07-01 DIAGNOSIS — Z801 Family history of malignant neoplasm of trachea, bronchus and lung: Secondary | ICD-10-CM

## 2017-07-01 DIAGNOSIS — I214 Non-ST elevation (NSTEMI) myocardial infarction: Secondary | ICD-10-CM | POA: Diagnosis not present

## 2017-07-01 DIAGNOSIS — R55 Syncope and collapse: Secondary | ICD-10-CM | POA: Diagnosis present

## 2017-07-01 DIAGNOSIS — I341 Nonrheumatic mitral (valve) prolapse: Secondary | ICD-10-CM | POA: Diagnosis present

## 2017-07-01 DIAGNOSIS — J45909 Unspecified asthma, uncomplicated: Secondary | ICD-10-CM | POA: Diagnosis present

## 2017-07-01 DIAGNOSIS — I2510 Atherosclerotic heart disease of native coronary artery without angina pectoris: Secondary | ICD-10-CM | POA: Diagnosis present

## 2017-07-01 DIAGNOSIS — R0600 Dyspnea, unspecified: Secondary | ICD-10-CM

## 2017-07-01 DIAGNOSIS — R072 Precordial pain: Secondary | ICD-10-CM | POA: Diagnosis not present

## 2017-07-01 DIAGNOSIS — E785 Hyperlipidemia, unspecified: Secondary | ICD-10-CM | POA: Diagnosis present

## 2017-07-01 DIAGNOSIS — Z8249 Family history of ischemic heart disease and other diseases of the circulatory system: Secondary | ICD-10-CM

## 2017-07-01 DIAGNOSIS — Z87891 Personal history of nicotine dependence: Secondary | ICD-10-CM

## 2017-07-01 LAB — BASIC METABOLIC PANEL
Anion gap: 9 (ref 5–15)
BUN: 13 mg/dL (ref 6–20)
CHLORIDE: 107 mmol/L (ref 101–111)
CO2: 20 mmol/L — AB (ref 22–32)
CREATININE: 0.68 mg/dL (ref 0.44–1.00)
Calcium: 9.4 mg/dL (ref 8.9–10.3)
GFR calc Af Amer: >60 mL/min (ref >60)
GFR calc non Af Amer: >60 mL/min (ref >60)
GLUCOSE: 104 mg/dL — AB (ref 65–99)
Potassium: 3.9 mmol/L (ref 3.5–5.1)
Sodium: 136 mmol/L (ref 135–145)

## 2017-07-01 LAB — I-STAT TROPONIN, ED: Troponin i, poc: 0.16 ng/mL (ref 0.00–0.08)

## 2017-07-01 LAB — CBC
HCT: 39.2 % (ref 36.0–46.0)
Hemoglobin: 12.7 g/dL (ref 12.0–15.0)
MCH: 30.2 pg (ref 26.0–34.0)
MCHC: 32.4 g/dL (ref 30.0–36.0)
MCV: 93.1 fL (ref 78.0–100.0)
PLATELETS: 313 10*3/uL (ref 150–400)
RBC: 4.21 MIL/uL (ref 3.87–5.11)
RDW: 13.5 % (ref 11.5–15.5)
WBC: 12.3 10*3/uL — ABNORMAL HIGH (ref 4.0–10.5)

## 2017-07-01 LAB — PROCALCITONIN: PROCALCITONIN: 0.5 ng/mL

## 2017-07-01 MED ORDER — PANTOPRAZOLE SODIUM 40 MG PO TBEC
40.0000 mg | DELAYED_RELEASE_TABLET | Freq: Every day | ORAL | Status: DC
  Administered 2017-07-02 – 2017-07-08 (×7): 40 mg via ORAL
  Filled 2017-07-01 (×7): qty 1

## 2017-07-01 MED ORDER — SPIRONOLACTONE 25 MG PO TABS
25.0000 mg | ORAL_TABLET | Freq: Every day | ORAL | Status: DC
  Administered 2017-07-02 – 2017-07-08 (×7): 25 mg via ORAL
  Filled 2017-07-01 (×7): qty 1

## 2017-07-01 MED ORDER — ACETAMINOPHEN 325 MG PO TABS
650.0000 mg | ORAL_TABLET | ORAL | Status: DC | PRN
  Administered 2017-07-01 – 2017-07-07 (×7): 650 mg via ORAL
  Filled 2017-07-01 (×8): qty 2

## 2017-07-01 MED ORDER — FOLIC ACID 1 MG PO TABS
1.0000 mg | ORAL_TABLET | Freq: Every day | ORAL | Status: DC
  Administered 2017-07-02 – 2017-07-08 (×7): 1 mg via ORAL
  Filled 2017-07-01 (×7): qty 1

## 2017-07-01 MED ORDER — POTASSIUM CHLORIDE CRYS ER 20 MEQ PO TBCR
20.0000 meq | EXTENDED_RELEASE_TABLET | Freq: Every day | ORAL | Status: DC
  Administered 2017-07-02 – 2017-07-08 (×7): 20 meq via ORAL
  Filled 2017-07-01 (×8): qty 1

## 2017-07-01 MED ORDER — ASPIRIN EC 81 MG PO TBEC
81.0000 mg | DELAYED_RELEASE_TABLET | Freq: Every day | ORAL | Status: DC
  Administered 2017-07-02 – 2017-07-03 (×2): 81 mg via ORAL
  Filled 2017-07-01 (×2): qty 1

## 2017-07-01 MED ORDER — TRAMADOL HCL 50 MG PO TABS
50.0000 mg | ORAL_TABLET | Freq: Every day | ORAL | Status: DC | PRN
  Administered 2017-07-08: 50 mg via ORAL
  Filled 2017-07-01: qty 1

## 2017-07-01 MED ORDER — ZOLPIDEM TARTRATE 5 MG PO TABS
5.0000 mg | ORAL_TABLET | Freq: Every evening | ORAL | Status: DC | PRN
  Administered 2017-07-01: 5 mg via ORAL
  Filled 2017-07-01 (×2): qty 1

## 2017-07-01 MED ORDER — ALBUTEROL SULFATE (2.5 MG/3ML) 0.083% IN NEBU
2.5000 mg | INHALATION_SOLUTION | RESPIRATORY_TRACT | Status: DC | PRN
  Administered 2017-07-05: 2.5 mg via RESPIRATORY_TRACT
  Filled 2017-07-01: qty 3

## 2017-07-01 MED ORDER — FA-PYRIDOXINE-CYANOCOBALAMIN 2.5-25-2 MG PO TABS: 1.0000 | ORAL_TABLET | Freq: Every day | ORAL | Status: DC

## 2017-07-01 MED ORDER — LOSARTAN POTASSIUM 50 MG PO TABS
50.0000 mg | ORAL_TABLET | Freq: Every day | ORAL | Status: DC
Start: 2017-07-02 — End: 2017-07-08
  Administered 2017-07-02 – 2017-07-08 (×7): 50 mg via ORAL
  Filled 2017-07-01 (×7): qty 1

## 2017-07-01 MED ORDER — CARVEDILOL 12.5 MG PO TABS
12.5000 mg | ORAL_TABLET | Freq: Two times a day (BID) | ORAL | Status: DC
  Administered 2017-07-02 – 2017-07-05 (×8): 12.5 mg via ORAL
  Filled 2017-07-01 (×8): qty 1

## 2017-07-01 MED ORDER — AMLODIPINE BESYLATE 5 MG PO TABS
5.0000 mg | ORAL_TABLET | Freq: Every day | ORAL | Status: DC
  Administered 2017-07-02 – 2017-07-08 (×7): 5 mg via ORAL
  Filled 2017-07-01 (×7): qty 1

## 2017-07-01 MED ORDER — VITAMIN B-12 1000 MCG PO TABS
2500.0000 ug | ORAL_TABLET | Freq: Every day | ORAL | Status: DC
  Administered 2017-07-02 – 2017-07-08 (×7): 2500 ug via ORAL
  Filled 2017-07-01 (×4): qty 5
  Filled 2017-07-01: qty 2
  Filled 2017-07-01 (×2): qty 5

## 2017-07-01 MED ORDER — ONDANSETRON HCL 4 MG/2ML IJ SOLN: 4.0000 mg | Freq: Four times a day (QID) | INTRAMUSCULAR | Status: DC | PRN

## 2017-07-01 MED ORDER — BUDESONIDE 0.5 MG/2ML IN SUSP
0.5000 mg | Freq: Two times a day (BID) | RESPIRATORY_TRACT | Status: DC
  Administered 2017-07-01 – 2017-07-08 (×14): 0.5 mg via RESPIRATORY_TRACT
  Filled 2017-07-01 (×14): qty 2

## 2017-07-01 MED ORDER — ENOXAPARIN SODIUM 40 MG/0.4ML ~~LOC~~ SOLN
40.0000 mg | SUBCUTANEOUS | Status: DC
  Administered 2017-07-01 – 2017-07-02 (×2): 40 mg via SUBCUTANEOUS
  Filled 2017-07-01 (×2): qty 0.4

## 2017-07-01 MED ORDER — MONTELUKAST SODIUM 10 MG PO TABS
10.0000 mg | ORAL_TABLET | Freq: Every day | ORAL | Status: DC
  Administered 2017-07-01 – 2017-07-07 (×7): 10 mg via ORAL
  Filled 2017-07-01 (×7): qty 1

## 2017-07-01 MED ORDER — FLUTICASONE PROPIONATE HFA 220 MCG/ACT IN AERO: 2.0000 | INHALATION_SPRAY | Freq: Two times a day (BID) | RESPIRATORY_TRACT | Status: DC

## 2017-07-01 MED ORDER — IOPAMIDOL (ISOVUE-370) INJECTION 76%
INTRAVENOUS | Status: AC
  Administered 2017-07-01: 100 mL
  Filled 2017-07-01: qty 100

## 2017-07-01 MED ORDER — VITAMIN B-12 2500 MCG SL SUBL: 2500.0000 ug | SUBLINGUAL_TABLET | Freq: Every day | SUBLINGUAL | Status: DC

## 2017-07-01 MED ORDER — LORAZEPAM 1 MG PO TABS
1.0000 mg | ORAL_TABLET | Freq: Two times a day (BID) | ORAL | Status: DC
  Administered 2017-07-01 – 2017-07-08 (×14): 1 mg via ORAL
  Filled 2017-07-01 (×14): qty 1

## 2017-07-01 NOTE — ED Notes (Signed)
Patient transported to CT 

## 2017-07-01 NOTE — ED Notes (Signed)
Larkin Ina RN made aware of positive trop

## 2017-07-01 NOTE — H&P (Addendum)
History and Physical    Amanda Jackson [id number] DOB: 1940/12/01 DOA: 07/01/2017  PCP: Midge Minium, MD Patient coming from: Home  Chief Complaint: Chest pain  HPI: Amanda Jackson is a 77 y.o. female with medical history significant of hypertension, vocal fold paralysis/hoarseness, allergic rhinitis, anxiety, GERD.  Patient presented to the emergency department secondary to persistent substernal chest pain.  Last night, patient reports having substernal achy chest pain that radiated to her left arm.  She reports taking 1 nitro with some mild relief and took another one within 5 minutes with mild relief of persistent chest pain.  While standing by her countertop and attempting to ambulate, she started feeling flushed, diaphoretic and fell down, dyspnea losing consciousness. She considered going to the hospital but felt slightly better.  No associated tremors or trauma or incontinence.  This morning she woke up still having chest pain and presented to the ED for evaluation.  She reports taking her aspirin today.  She did not take any more nitroglycerin today.  Chest pain was not affected by exertion.    ED Course: Vitals: Afebrile, normal pulse, respirations, blood pressure and on room air Labs: CO2 of 20, Troponin of 0.16, WBC of 12.3 Imaging: CTA significant for mass-like consolidation Medications/Course: None  Review of Systems: Review of Systems  Constitutional: Positive for chills, diaphoresis and weight loss. Negative for fever.  Respiratory: Positive for cough and shortness of breath. Negative for sputum production.   Cardiovascular: Positive for chest pain.  Gastrointestinal: Positive for heartburn. Negative for abdominal pain, blood in stool, constipation, diarrhea, nausea and vomiting.  Genitourinary: Negative for dysuria.  Neurological: Positive for loss of consciousness. Negative for seizures.  All other systems reviewed and are negative.   Past Medical History:    Diagnosis Date  . Allergy   . Anxiety   . Arthritis   . Asthma   . Atypical chest pain    Normal Myoview 2008  . Colon polyp   . GERD (gastroesophageal reflux disease)   . Hx of colonoscopy 03/18/09  . Hyperlipidemia   . Hypertension   . Left nasal polyps   . MVP (mitral valve prolapse)   . Seasonal allergies   . Tubulovillous adenoma     Past Surgical History:  Procedure Laterality Date  . ABDOMINAL HYSTERECTOMY    . BREAST SURGERY  2010   biopsy-benign  . BUNIONECTOMY    . COLONOSCOPY    . FUNCTIONAL ENDOSCOPIC SINUS SURGERY  08/28/08   with bilateral ethmoidectomies and bilateral maxillary sinus ostial enlargement with removal of mucocyst, mucous membrane, and mucoid material  . KNEE ARTHROSCOPY W/ LATERAL RELEASE  11/17/99   Left  . KNEE ARTHROSCOPY W/ PARTIAL MEDIAL MENISCECTOMY  11/17/99   left  . Laparoscopic Right Colectomy  01/25/03  . nasal polyp removal    . POLYPECTOMY    . TONSILLECTOMY  1952     reports that she quit smoking about 39 years ago. she has never used smokeless tobacco. She reports that she drinks alcohol. She reports that she does not use drugs.  Allergies  Allergen Reactions  . Tiotropium Swelling    Facial and lip swelling,     Family History  Problem Relation Age of Onset  . Hypertension Son   . Lung cancer Father   . Cancer Father   . Hypertension Father   . Liver cancer Mother   . Cancer Mother   . Hypertension Mother   . Colon cancer Mother   .  Colon cancer Sister   . Heart murmur Son   . Stomach cancer Neg Hx   . Ulcerative colitis Neg Hx   . Allergic rhinitis Neg Hx   . Angioedema Neg Hx   . Asthma Neg Hx     Prior to Admission medications   Medication Sig Start Date End Date Taking? Authorizing Provider  acetaminophen (TYLENOL ARTHRITIS PAIN) 650 MG CR tablet Take 650 mg by mouth every 8 (eight) hours as needed for pain.   Yes [provider]  albuterol (PROVENTIL HFA;VENTOLIN HFA) 108 (90 Base) MCG/ACT  inhaler INHALE TWO PUFFS EVERY 4 HOURS AS NEEDED FOR COUGH OR WHEEZE. 04/13/17  Yes Bobbitt, Sedalia Muta, MD  amLODipine (NORVASC) 5 MG tablet Take 1 tablet (5 mg total) by mouth daily. 02/23/17  Yes Midge Minium, MD  aspirin 81 MG EC tablet Take 81 mg by mouth daily.     Yes [provider]  budesonide (PULMICORT) 0.5 MG/2ML nebulizer solution Mix budesonide/saline nasal irrigation twice a day. Patient taking differently: 0.5 mg See admin instructions. Mix budesonide (0.5 mg) with powdered OTC saline sinus rinse and approx 8 oz sterile water, squirt into both nostrils twice a day. 08/31/16  Yes Bobbitt, Sedalia Muta, MD  Calcium Carb-Cholecalciferol (CALCIUM 600+D3 PO) Take 600 mg by mouth 2 (two) times daily.    Yes [provider]  carvedilol (COREG) 12.5 MG tablet TAKE ONE TABLET BY MOUTH TWICE DAILY WITH A MEAL Patient taking differently: TAKE ONE TABLET (12.5 MG) BY MOUTH TWICE DAILY WITH A MEAL 12/28/16  Yes Midge Minium, MD  Cyanocobalamin (VITAMIN B-12) 2500 MCG SUBL Place 2,500 mcg under the tongue daily.    Yes [provider]  fluticasone (FLOVENT HFA) 220 MCG/ACT inhaler Inhale 2 puffs 2 (two) times daily into the lungs. Patient taking differently: Inhale 3 puffs into the lungs 2 (two) times daily.  02/28/17  Yes Bobbitt, Sedalia Muta, MD  FOLBIC 2.5-25-2 MG TABS tablet TAKE 1 TABLET BY MOUTH EVERY DAY 06/20/17  Yes Midge Minium, MD  ibuprofen (ADVIL,MOTRIN) 800 MG tablet TAKE 1 TABLET (800 MG TOTAL) BY MOUTH 3 (THREE) TIMES DAILY AS NEEDED FOR PAIN 01/06/16  Yes Midge Minium, MD  KLOR-CON M20 20 MEQ tablet TAKE 1 TABLET BY MOUTH EVERY DAY Patient taking differently: TAKE 1 TABLET (20 MEQ)  BY MOUTH EVERY DAY 03/08/17  Yes [name], MD  LORazepam (ATIVAN) 1 MG tablet TAKE 1 TABLET BY MOUTH TWICE A DAY Patient taking differently: TAKE 1 TABLET (1 MG) BY MOUTH TWICE A DAY 06/07/17  Yes Midge Minium, MD  losartan (COZAAR) 50  MG tablet TAKE 1 TABLET BY MOUTH EVERY DAY Patient taking differently: TAKE 1 TABLET (50 MG)  BY MOUTH EVERY DAY 02/11/17  Yes [name], MD  montelukast (SINGULAIR) 10 MG tablet TAKE 1 TABLET BY MOUTH EVERY DAY Patient taking differently: TAKE 1 TABLET (10 MG) BY MOUTH EVERY DAY AT BEDTIME 02/18/17  Yes Bobbitt, Sedalia Muta, MD  Multiple Vitamin (MULTIVITAMIN WITH MINERALS) TABS tablet Take 1 tablet by mouth daily. Centrum   Yes [provider]  nitroGLYCERIN (NITROSTAT) 0.4 MG SL tablet Place 1 tablet (0.4 mg total) under the tongue every 5 (five) minutes as needed. For chest pain (patient unsure of MAX doses allowed) Patient taking differently: Place 0.4 mg under the tongue every 5 (five) minutes as needed for chest pain.  06/29/17  Yes Midge Minium, MD  omeprazole (PRILOSEC) 20 MG capsule  TAKE 1 CAPSULE (20 MG TOTAL) BY MOUTH 2 (TWO) TIMES DAILY. Patient taking differently: Take 20 mg by mouth 2 (two) times daily as needed (chest pain/acid reflux).  12/01/15  Yes Midge Minium, MD  spironolactone (ALDACTONE) 25 MG tablet TAKE 1 TABLET (25 MG TOTAL) BY MOUTH DAILY. PLEASE KEEP 01/11/17 APPT FOR FURHTER REFILLS Patient taking differently: Take 25 mg by mouth daily.  02/18/17  Yes [name], MD  traMADol (ULTRAM) 50 MG tablet Take 50 mg by mouth daily as needed (knee pain). Reported on 07/23/2015 10/18/14  Yes [provider]  triamcinolone cream (KENALOG) 0.1 % 1 APPLICATION TO AFFECTED AREA TWICE A DAY AS NEEDED FOR RED SPOTS ON BACK 03/15/17  Yes [provider]  Beclomethasone Dipropionate (QNASL) 80 MCG/ACT AERS ONE SPRAY EACH NOSTRIL TWICE DAILY FOR STUFFY NOSE OR DRAINAGE. Patient not taking: Reported on 07/01/2017 01/13/17   Adelina Mings, MD    Physical Exam: Vitals:   07/01/17 1615 07/01/17 1630 07/01/17 1645 07/01/17 1716  BP: 115/77 119/80 107/86 105/76  Pulse: 80 81 79 89  Resp: 17 17 20  (!) 25  Temp:      TempSrc:        SpO2: 97% 98% 98% 97%  Weight:      Height:         Constitutional: NAD, calm, comfortable Eyes: PERRL, lids and conjunctivae normal ENMT: Mucous membranes are moist. Posterior pharynx clear of any exudate or lesions.Normal dentition.  Neck: normal, supple, non-tender left lymph node, no thyromegaly Respiratory: clear to auscultation on right with diminished breath sounds on left. No wheezing or rales. Normal respiratory effort. No accessory muscle use.  Cardiovascular: Regular rate and rhythm, no murmurs / rubs / gallops. No extremity edema. 2+ pedal pulses. No carotid bruits.  Abdomen: no tenderness, no masses palpated. No hepatosplenomegaly. Bowel sounds positive.  Musculoskeletal: no clubbing / cyanosis. No joint deformity upper and lower extremities. Good ROM, no contractures. Normal muscle tone.  Skin: no rashes, lesions, ulcers. No induration Neurologic: CN 2-12 grossly intact. Sensation intact, DTR normal. Strength 5/5 in all 4.  Psychiatric: Normal judgment and insight. Alert and oriented x 3. Normal mood.    Labs on Admission: I have personally reviewed following labs and imaging studies  CBC: Recent Labs  Lab 07/01/17 1432  WBC 12.3*  HGB 12.7  HCT 39.2  MCV 93.1  PLT 341   Basic Metabolic Panel: Recent Labs  Lab 07/01/17 1432  NA 136  K 3.9  CL 107  CO2 20*  GLUCOSE 104*  BUN 13  CREATININE 0.68  CALCIUM 9.4   GFR: Estimated Creatinine Clearance: 45.1 mL/min (by C-G formula based on SCr of 0.68 mg/dL). Liver Function Tests: No results for input(s): AST, ALT, ALKPHOS, BILITOT, PROT, ALBUMIN in the last 168 hours. No results for input(s): LIPASE, AMYLASE in the last 168 hours. No results for input(s): AMMONIA in the last 168 hours. Coagulation Profile: No results for input(s): INR, PROTIME in the last 168 hours. Cardiac Enzymes: No results for input(s): CKTOTAL, CKMB, CKMBINDEX, TROPONINI in the last 168 hours. BNP (last 3 results) No results for  input(s): PROBNP in the last 8760 hours. HbA1C: No results for input(s): HGBA1C in the last 72 hours. CBG: No results for input(s): GLUCAP in the last 168 hours. Lipid Profile: No results for input(s): CHOL, HDL, LDLCALC, TRIG, CHOLHDL, LDLDIRECT in the last 72 hours. Thyroid Function Tests: No results for input(s): TSH, T4TOTAL, FREET4, T3FREE, THYROIDAB in the  last 72 hours. Anemia Panel: No results for input(s): VITAMINB12, FOLATE, FERRITIN, TIBC, IRON, RETICCTPCT in the last 72 hours. Urine analysis:    Component Value Date/Time   COLORURINE YELLOW 08/21/2008 0913   APPEARANCEUR CLEAR 08/21/2008 0913   LABSPEC 1.018 08/21/2008 0913   PHURINE 6.5 08/21/2008 0913   GLUCOSEU NEGATIVE 08/21/2008 0913   HGBUR NEGATIVE 08/21/2008 0913   BILIRUBINUR neg 04/12/2011 1036   KETONESUR NEGATIVE 08/21/2008 0913   PROTEINUR neg 04/12/2011 1036   PROTEINUR NEGATIVE 08/21/2008 0913   UROBILINOGEN negative 04/12/2011 1036   UROBILINOGEN 0.2 08/21/2008 0913   NITRITE neg 04/12/2011 1036   NITRITE NEGATIVE 08/21/2008 0913   LEUKOCYTESUR Negative 04/12/2011 1036   Sepsis Labs: !!!!!!!!!!!!!!!!!!!!!!!!!!!!!!!!!!!!!!!!!!!! @LABRCNTIP (procalcitonin:4,lacticidven:4) )No results found for this or any previous visit (from the past 240 hour(s)).   Radiological Exams on Admission: Dg Chest 2 View  Result Date: 07/01/2017 CLINICAL DATA:  Left-sided chest pain EXAM: CHEST - 2 VIEW COMPARISON:  11/15/2014 FINDINGS: Cardiac shadow is within normal limits. The aorta is unremarkable. Right lung is clear. Left lung demonstrates fullness in the left suprahilar region extending into the upper lobe. No other focal infiltrate is seen. No sizable effusion is noted. IMPRESSION: Left suprahilar density with upper lobe consolidation. CT of the chest with contrast is recommended to rule out underlying centrally obstructing lesion. Electronically Signed   By: Inez Catalina M.D.   On: 07/01/2017 15:22   Ct Angio Chest Pe  W And/or Wo Contrast  Result Date: 07/01/2017 CLINICAL DATA:  77 year old female with acute chest pain and shortness of breath for 1 day. EXAM: CT ANGIOGRAPHY CHEST WITH CONTRAST TECHNIQUE: Multidetector CT imaging of the chest was performed using the standard protocol during bolus administration of intravenous contrast. Multiplanar CT image reconstructions and MIPs were obtained to evaluate the vascular anatomy. CONTRAST:  119mL ISOVUE-370 IOPAMIDOL (ISOVUE-370) INJECTION 76% COMPARISON:  07/01/2017 radiographs, 11/21/2014 abdominal CT and other studies. FINDINGS: Cardiovascular: This is a technically satisfactory study. No pulmonary emboli are identified. There is no evidence of thoracic aortic aneurysm. UPPER limits normal heart size noted with small pericardial effusion. Mediastinum/Nodes: Ill-defined soft tissue/adenopathy throughout the LEFT mediastinum, LEFT hilum and subcarinal region noted. Visualized thyroid gland is unremarkable. Lungs/Pleura: Masslike consolidation throughout the majority of the LEFT UPPER lobe noted and highly suspicious for central malignancy with likely some degree of atelectasis. The entire mass-like consolidation and atelectasis measures 7 x 8.6 x 11.2 cm. Narrowing of the LEFT UPPER/central pulmonary arteries noted. A small LEFT pleural effusion is noted. The RIGHT lung is clear. Upper Abdomen: No acute abnormality. Multiple hepatic cysts are unchanged from 2016. No definite new hepatic abnormalities noted. Musculoskeletal: No acute bony abnormalities or suspicious focal bony lesions. Review of the MIP images confirms the above findings. IMPRESSION: 1. Masslike consolidation with probable atelectasis of majority of the LEFT UPPER lobe highly suspicious for malignancy with some degree of atelectasis. Ill-defined soft tissue/adenopathy throughout the LEFT mediastinum, LEFT hilum and subcarinal regions. Associated small LEFT pleural effusion. Electronically Signed   By: Margarette Canada  M.D.   On: 07/01/2017 17:04    EKG: Independently reviewed. NSR  Assessment/Plan Principal Problem:   Chest pain Active Problems:   Essential hypertension   Anxiety   Syncope   Lung mass  Atypical chest pain Heart score of 5. Chest pain resolved now. Troponin mildly elevated on admission. Possibly GI related as patient has history of GERD. EKG unremarkable. -Trend troponin -Echocardiogram -Telemetry -If troponin trending up, recommend calling cardiology for  formal consult.  Mass-like consolidation Concerning for cancer. Patient with mildly elevated WBC with chills. Could possibly be an abscess but clinical suspicion is very low. Cough is chronic and non-productive. -Pulmonology consulted -Procalcitonin  Syncope Likely vasovagal vs secondary to nitroglycerin. Loss of consciousness without head injury. -Telemetry -Echocardiogram  Essential hypertension Well controlled -Continue amlodipine, losartan, spironolactone  Anxiety Stable -Continue Ativan BID  GERD Patient takes omeprazole as an outpatient -Protonix BID  Persistent cough Allergies Follows with allergist. -Continue Singulair, Pulmicort, Flonase  Hoarseness Patient with diagnosis of vocal fold paralysis. Sees ENT as an outpatient.   DVT prophylaxis: Lovenox Code Status: DNR Family Communication: Significant other at bedside Disposition Plan: Discharge likely home pending ACS workup Consults called: Pulmonology, Dr. Halford Chessman Admission status: Observation, Telemetry   Cordelia Poche, MD [facility] Pager [phone]  If 7PM-7AM, please contact night-coverage [email or web address] Password [email or web address]  07/01/2017, 5:59 PM
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
## 2017-07-01 NOTE — Plan of Care (Signed)
  Education: Knowledge of General Education information will improve 07/01/2017 2334 - Completed/Met by Theora Gianotti, RN   Pt oriented to unit, plan of care, and method of reporting concerns. Verbalizes understanding of need to call for assistance prior to ambulation when necessary. Bedside table and call light within reach

## 2017-07-01 NOTE — ED Provider Notes (Signed)
[facility] EMERGENCY DEPARTMENT Provider Note   CSN: [id number] Arrival date & time: 07/01/17  1413     History   Chief Complaint Chief Complaint  Patient presents with  . Chest Pain    HPI Amanda Jackson is a 77 y.o. female.  HPI   77 year old female with history of GERD, hypertension, remote history of tubulovillous adenoma presenting for evaluation of chest pain.  Patient report for the past 5 days she has had recurrent pain in her mid chest.  She described pain as a dull sensation that is waxing waning.  Pain is not associated with exertion.  She however does endorse having some shortness of breath with exertion for the same duration.  Last night, while having pain she did took her medication including 2 nitros.  She then felt lightheadedness and subsequently had a syncopal episode.  She endorsed some left arm pain before and after the syncopal episode and unsure if she may have injured.  States that the left arm pain was there prior when she was experiencing the chest discomfort.  Currently she denies having any significant pain but this morning the pain was there prompting her to come here for further evaluation.  She denies associated fever, chills, headache, productive cough, abdominal pain, back pain, or focal numbness or weakness.  She has had hoarseness since last November.  She was told that her vocal cord does not function appropriately and she is scheduled to have a CT scan of her neck in the near future.  She endorsed a similar chest pain last summer followed by a syncopal episode.  She denies any prior history of PE or DVT, no prior history of cancer.  No abnormal weight changes but endorse occasional night sweats.  She is not a smoker or drinker.  She is unable to recall last cardiac stress test.  Her cardiologist is Dr. Jolly Mango.    Past Medical History:  Diagnosis Date  . Allergy   . Anxiety   . Arthritis   . Asthma   . Atypical chest pain      Normal Myoview 2008  . Colon polyp   . GERD (gastroesophageal reflux disease)   . Hx of colonoscopy 03/18/09  . Hyperlipidemia   . Hypertension   . Left nasal polyps   . MVP (mitral valve prolapse)   . Seasonal allergies   . Tubulovillous adenoma     Patient Active Problem List   Diagnosis Date Noted  . 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 Hoarseness of voice 05/24/2017  . Migraine 01/11/2017  . Insect bite 08/31/2016  . Persistent cough 11/24/2015  . Vasovagal syncope 10/15/2015  . Hypokalemia 08/22/2015  . Syncope 07/30/2015  . Asthma with acute exacerbation 03/25/2015  . Acute sinusitis 03/25/2015  . History of nasal polyps 03/25/2015  . Abdominal pain, left lower quadrant 11/04/2014  . Leg pain, posterior 11/05/2013  . Mild persistent asthma 05/08/2013  . Loss of weight 12/05/2012  . Abnormal TSH 10/11/2012  . GERD (gastroesophageal reflux disease) 04/12/2012  . Routine general medical examination at a health care facility 04/12/2012  . Atypical chest pain   . Allergic rhinitis   . MVP (mitral valve prolapse)   . Tubulovillous adenoma   . Anxiety 04/12/2011  . Hx of adenomatous colonic polyps 04/12/2011  . Hepatic cyst 04/12/2011  . History of mitral valve prolapse 04/12/2011  . Hx of colonoscopy 03/18/2009  . Essential hypertension  01/17/2009  . PRECORDIAL PAIN 01/17/2009    Past Surgical History:  Procedure Laterality Date  . ABDOMINAL HYSTERECTOMY    . BREAST SURGERY  2010   biopsy-benign  . BUNIONECTOMY    . COLONOSCOPY    . FUNCTIONAL ENDOSCOPIC SINUS SURGERY  08/28/08   with bilateral ethmoidectomies and bilateral maxillary sinus ostial enlargement with removal of mucocyst, mucous membrane, and mucoid material  . KNEE ARTHROSCOPY W/ LATERAL RELEASE  11/17/99   Left  . KNEE ARTHROSCOPY W/ PARTIAL MEDIAL MENISCECTOMY  11/17/99   left  . Laparoscopic Right Colectomy  01/25/03  . nasal polyp removal    .  POLYPECTOMY    . TONSILLECTOMY  1952    OB History    Gravida Para Term Preterm AB Living   2 1     1 1    SAB TAB Ectopic Multiple Live Births                   Home Medications    Prior to Admission medications   Medication Sig Start Date End Date Taking? Authorizing Provider  acetaminophen (TYLENOL ARTHRITIS PAIN) 650 MG CR tablet Take 650 mg by mouth every 8 (eight) hours as needed for pain.    [provider]  albuterol (PROVENTIL HFA;VENTOLIN HFA) 108 (90 Base) MCG/ACT inhaler INHALE TWO PUFFS EVERY 4 HOURS AS NEEDED FOR COUGH OR WHEEZE. 04/13/17   Bobbitt, Sed[place] Muta, MD  amLODipine (NORVASC) 5 MG tablet Take 1 tablet (5 mg total) by mouth daily. 02/23/17   Midge Minium, MD  aspirin 81 MG EC tablet Take 81 mg by mouth daily.      [provider]  Beclomethasone Dipropionate (QNASL) 80 MCG/ACT AERS ONE SPRAY EACH NOSTRIL TWICE DAILY FOR STUFFY NOSE OR DRAINAGE. 01/13/17   Bobbitt, Sed[place] Muta, MD  budesonide (PULMICORT) 0.5 MG/2ML nebulizer solution Mix budesonide/saline nasal irrigation twice a day. 08/31/16   Bobbitt, Sedalia Muta, MD  Calcium Carb-Cholecalciferol (CALCIUM 600+D3 PO) Take 600 mg by mouth 2 (two) times daily.     [provider]  carvedilol (COREG) 12.5 MG tablet TAKE ONE TABLET BY MOUTH TWICE DAILY WITH A MEAL 12/28/16   Midge Minium, MD  Cyanocobalamin (VITAMIN B-12) 2500 MCG SUBL Place under the tongue.    [provider]  fluticasone (FLOVENT HFA) 220 MCG/ACT inhaler Inhale 2 puffs 2 (two) times daily into the lungs. 02/28/17   Bobbitt, Sedalia Muta, MD  FOLBIC 2.5-25-2 MG TABS tablet TAKE 1 TABLET BY MOUTH EVERY DAY 06/20/17   Midge Minium, MD  ibuprofen (ADVIL,MOTRIN) 800 MG tablet TAKE 1 TABLET (800 MG TOTAL) BY MOUTH 3 (THREE) TIMES DAILY AS NEEDED FOR PAIN 01/06/16   Midge Minium, MD  KLOR-CON M20 20 MEQ tablet TAKE 1 TABLET BY MOUTH EVERY DAY 03/08/17   [name], MD  LORazepam (ATIVAN)  1 MG tablet TAKE 1 TABLET BY MOUTH TWICE A DAY 06/07/17   Midge Minium, MD  losartan (COZAAR) 50 MG tablet TAKE 1 TABLET BY MOUTH EVERY DAY 02/11/17   [name], MD  montelukast (SINGULAIR) 10 MG tablet TAKE 1 TABLET BY MOUTH EVERY DAY 02/18/17   Bobbitt, [place] Muta, MD  Multiple Vitamins-Minerals (CENTRUM) tablet Take 1 tablet by mouth daily.      [provider]  nitroGLYCERIN (NITROSTAT) 0.4 MG SL tablet Place 1 tablet (0.4 mg total) under the tongue every 5 (five) minutes as needed. For chest pain (patient unsure of MAX doses  allowed) 06/29/17   Midge Minium, MD  omeprazole (PRILOSEC) 20 MG capsule TAKE 1 CAPSULE (20 MG TOTAL) BY MOUTH 2 (TWO) TIMES DAILY. Patient taking differently: Take 20 mg by mouth daily. TAKE 1 CAPSULE (20 MG TOTAL) BY MOUTH 2 (TWO) TIMES DAILY. 12/01/15   Midge Minium, MD  spironolactone (ALDACTONE) 25 MG tablet TAKE 1 TABLET (25 MG TOTAL) BY MOUTH DAILY. PLEASE KEEP 01/11/17 APPT FOR FURHTER REFILLS 02/18/17   [name], MD  traMADol (ULTRAM) 50 MG tablet Take 50 mg by mouth every 6 (six) hours as needed. Reported on 07/23/2015 10/18/14   [provider]  triamcinolone cream (KENALOG) 0.1 % 1 APPLICATION TO AFFECTED AREA TWICE A DAY EXTERNALLY 14 DAYS 03/15/17   [provider]    Family History Family History  Problem Relation Age of Onset  . Hypertension Son   . Lung cancer Father   . Cancer Father   . Hypertension Father   . Liver cancer Mother   . Cancer Mother   . Hypertension Mother   . Colon cancer Mother   . Colon cancer Sister   . Heart murmur Son   . Stomach cancer Neg Hx   . Ulcerative colitis Neg Hx   . Allergic rhinitis Neg Hx   . Angioedema Neg Hx   . Asthma Neg Hx     Social History Social History   Tobacco Use  . Smoking status: Former Smoker    Last attempt to quit: 09/09/1977    Years since quitting: 39.8  . Smokeless tobacco: Never Used  . Tobacco comment: used to smoke  socially. Quit in 1979.  Substance Use Topics  . Alcohol use: Yes    Comment: usually has a glass of wine with dinner every evening  . Drug use: No     Allergies   Tiotropium   Review of Systems Review of Systems  All other systems reviewed and are negative.    Physical Exam Updated Vital Signs BP 109/82 (BP Location: Right Arm)   Pulse 93   Temp 97.8 F (36.6 C) (Oral)   Resp 14   SpO2 100%   Physical Exam  Constitutional: She is oriented to person, place, and time. She appears well-developed and well-nourished. No distress.  Patient's voice is hoarse  HENT:  Head: Atraumatic.  Eyes: Conjunctivae are normal.  Neck: Neck supple.  Cardiovascular: Normal rate, regular rhythm, intact distal pulses and normal pulses.  Pulmonary/Chest: Breath sounds normal. No respiratory distress. She has no decreased breath sounds. She has no wheezes. She has no rhonchi. She has no rales.  Abdominal: Soft. There is no tenderness.  Musculoskeletal:       Right lower leg: She exhibits no edema.       Left lower leg: She exhibits no edema.  Neurological: She is alert and oriented to person, place, and time.  Skin: No rash noted.  Psychiatric: She has a normal mood and affect.  Nursing note and vitals reviewed.    ED Treatments / Results  Labs (all labs ordered are listed, but only abnormal results are displayed) Labs Reviewed  BASIC METABOLIC PANEL - Abnormal; Notable for the following components:      Result Value   CO2 20 (*)    Glucose, Bld 104 (*)    All other components within normal limits  CBC - Abnormal; Notable for the following components:   WBC 12.3 (*)    All other components within normal limits  I-STAT  TROPONIN, ED - Abnormal; Notable for the following components:   Troponin i, poc 0.16 (*)    All other components within normal limits    EKG  EKG Interpretation  Date/Time:  Friday July 01 2017 15:50:25 EST Ventricular Rate:  86 PR Interval:    QRS  Duration: 85 QT Interval:  358 QTC Calculation: 429 R Axis:   68 Text Interpretation:  Sinus rhythm Confirmed by Dene Gentry [id number]) on 07/01/2017 3:52:49 PM       Date: 07/01/2017  Rate: 86  Rhythm: normal sinus rhythm  QRS Axis: normal  Intervals: normal  ST/T Wave abnormalities: normal  Conduction Disutrbances: none  Narrative Interpretation:   Old EKG Reviewed: No significant changes noted     Radiology Dg Chest 2 View  Result Date: 07/01/2017 CLINICAL DATA:  Left-sided chest pain EXAM: CHEST - 2 VIEW COMPARISON:  11/15/2014 FINDINGS: Cardiac shadow is within normal limits. The aorta is unremarkable. Right lung is clear. Left lung demonstrates fullness in the left suprahilar region extending into the upper lobe. No other focal infiltrate is seen. No sizable effusion is noted. IMPRESSION: Left suprahilar density with upper lobe consolidation. CT of the chest with contrast is recommended to rule out underlying centrally obstructing lesion. Electronically Signed   By: Inez Catalina M.D.   On: 07/01/2017 15:22   Ct Angio Chest Pe W And/or Wo Contrast  Result Date: 07/01/2017 CLINICAL DATA:  77 year old female with acute chest pain and shortness of breath for 1 day. EXAM: CT ANGIOGRAPHY CHEST WITH CONTRAST TECHNIQUE: Multidetector CT imaging of the chest was performed using the standard protocol during bolus administration of intravenous contrast. Multiplanar CT image reconstructions and MIPs were obtained to evaluate the vascular anatomy. CONTRAST:  164mL ISOVUE-370 IOPAMIDOL (ISOVUE-370) INJECTION 76% COMPARISON:  07/01/2017 radiographs, 11/21/2014 abdominal CT and other studies. FINDINGS: Cardiovascular: This is a technically satisfactory study. No pulmonary emboli are identified. There is no evidence of thoracic aortic aneurysm. UPPER limits normal heart size noted with small pericardial effusion. Mediastinum/Nodes: Ill-defined soft tissue/adenopathy throughout the LEFT mediastinum, LEFT  hilum and subcarinal region noted. Visualized thyroid gland is unremarkable. Lungs/Pleura: Masslike consolidation throughout the majority of the LEFT UPPER lobe noted and highly suspicious for central malignancy with likely some degree of atelectasis. The entire mass-like consolidation and atelectasis measures 7 x 8.6 x 11.2 cm. Narrowing of the LEFT UPPER/central pulmonary arteries noted. A small LEFT pleural effusion is noted. The RIGHT lung is clear. Upper Abdomen: No acute abnormality. Multiple hepatic cysts are unchanged from 2016. No definite new hepatic abnormalities noted. Musculoskeletal: No acute bony abnormalities or suspicious focal bony lesions. Review of the MIP images confirms the above findings. IMPRESSION: 1. Masslike consolidation with probable atelectasis of majority of the LEFT UPPER lobe highly suspicious for malignancy with some degree of atelectasis. Ill-defined soft tissue/adenopathy throughout the LEFT mediastinum, LEFT hilum and subcarinal regions. Associated small LEFT pleural effusion. Electronically Signed   By: Margarette Canada M.D.   On: 07/01/2017 17:04    Procedures Procedures (including critical care time)  Medications Ordered in ED Medications  iopamidol (ISOVUE-370) 76 % injection (100 mLs  Contrast Given 07/01/17 1632)     Initial Impression / Assessment and Plan / ED Course  I have reviewed the triage vital signs and the nursing notes.  Pertinent labs & imaging results that were available during my care of the patient were reviewed by me and considered in my medical decision making (see chart for details).  Clinical Course as  of Jul 02 1746  Fri Jul 01, 2017  1542 Troponin i, poc: (!!) 0.16 [PM]    Clinical Course User Index [PM] Valarie Merino, MD    BP 105/76   Pulse 89   Temp 97.8 F (36.6 C) (Oral)   Resp (!) 25   Ht 5\' 1"  (1.549 m)   Wt 52.6 kg (116 lb)   SpO2 97%   BMI 21.92 kg/m    Final Clinical Impressions(s) / ED Diagnoses   Final  diagnoses:  Mass of left lung  NSTEMI (non-ST elevated myocardial infarction) [facility])    ED Discharge Orders    None     4:20 PM Patient here with recurrent nonexertional chest discomfort for the past 5 days.  No active chest pain currently.  Did had a syncopal episode yesterday without any significant injury.  No significant tenderness to the left arm.  Initial EKG is unremarkable however her troponin is elevated at 0.16 likely reflecting NSTEMI.  Her chest x-ray is concerning shown a left suprahilar density with the upper lobe consolidation.  Plan to obtain chest CT angiogram for further evaluation of this lesion.  This lesion may also contribute to her recent hoarseness has been ongoing for the past several months.  I am concerned for malignancy causing her symptoms.  Care discussed with Dr. Francia Greaves  5:39 PM Chest CT angiogram demonstrate a masslike consolidation with probable atelectasis involving the majority of the left upper lobe that is highly suspicious for malignancy.  There is a small associated left pleural effusion.  No evidence of PE.  5:45 PM Patient was made aware of the CT scan findings including a mass of 7 x 8 x 11 cm.  I also consult tried hospitalist, and spoke with Dr. Lonny Prude who agrees to see and evaluate patient and will admit patient for further management.  She would likely benefit from oncology involvement as well as serial troponin and repeat EKG.  She is currently stable and does not have any active chest pain.  CRITICAL CARE Performed by: Domenic Moras Total critical care time: 35 minutes Critical care time was exclusive of separately billable procedures and treating other patients. Critical care was necessary to treat or prevent imminent or life-threatening deterioration. Critical care was time spent personally by me on the following activities: development of treatment plan with patient and/or surrogate as well as nursing, discussions with consultants, evaluation of  patient's response to treatment, examination of patient, obtaining history from patient or surrogate, ordering and performing treatments and interventions, ordering and review of laboratory studies, ordering and review of radiographic studies, pulse oximetry and re-evaluation of patient's condition.    Domenic Moras, PA-C 07/01/17 1748    Valarie Merino, MD 07/02/17 0010

## 2017-07-01 NOTE — ED Notes (Signed)
ED Provider at bedside. 

## 2017-07-01 NOTE — ED Notes (Signed)
Called patient to recheck vitals. No response. Pt in X-Ray.

## 2017-07-01 NOTE — ED Triage Notes (Signed)
Pt c/o Mid cp that raidates to L arm, pt reports pain onset yesterday and fell to L arm last night, denies hitting head and denies LOC, pt c/o SOB, c/o nuasea, denies diarrhea, pt A&O x4

## 2017-07-02 ENCOUNTER — Observation Stay (HOSPITAL_BASED_OUTPATIENT_CLINIC_OR_DEPARTMENT_OTHER): Payer: Medicare Other

## 2017-07-02 DIAGNOSIS — I361 Nonrheumatic tricuspid (valve) insufficiency: Secondary | ICD-10-CM

## 2017-07-02 DIAGNOSIS — R918 Other nonspecific abnormal finding of lung field: Secondary | ICD-10-CM

## 2017-07-02 DIAGNOSIS — F419 Anxiety disorder, unspecified: Secondary | ICD-10-CM | POA: Diagnosis not present

## 2017-07-02 DIAGNOSIS — I10 Essential (primary) hypertension: Secondary | ICD-10-CM | POA: Diagnosis not present

## 2017-07-02 DIAGNOSIS — R55 Syncope and collapse: Secondary | ICD-10-CM

## 2017-07-02 DIAGNOSIS — R490 Dysphonia: Secondary | ICD-10-CM | POA: Diagnosis not present

## 2017-07-02 DIAGNOSIS — R079 Chest pain, unspecified: Secondary | ICD-10-CM | POA: Diagnosis not present

## 2017-07-02 LAB — CBC
HCT: 38.5 % (ref 36.0–46.0)
HEMOGLOBIN: 12.7 g/dL (ref 12.0–15.0)
MCH: 31.1 pg (ref 26.0–34.0)
MCHC: 33 g/dL (ref 30.0–36.0)
MCV: 94.1 fL (ref 78.0–100.0)
Platelets: 299 10*3/uL (ref 150–400)
RBC: 4.09 MIL/uL (ref 3.87–5.11)
RDW: 14 % (ref 11.5–15.5)
WBC: 9.8 10*3/uL (ref 4.0–10.5)

## 2017-07-02 LAB — BASIC METABOLIC PANEL
ANION GAP: 10 (ref 5–15)
BUN: 12 mg/dL (ref 6–20)
CALCIUM: 9.2 mg/dL (ref 8.9–10.3)
CO2: 18 mmol/L — AB (ref 22–32)
Chloride: 109 mmol/L (ref 101–111)
Creatinine, Ser: 0.62 mg/dL (ref 0.44–1.00)
GFR calc Af Amer: >60 mL/min (ref >60)
GFR calc non Af Amer: >60 mL/min (ref >60)
GLUCOSE: 113 mg/dL — AB (ref 65–99)
Potassium: 3.7 mmol/L (ref 3.5–5.1)
Sodium: 137 mmol/L (ref 135–145)

## 2017-07-02 LAB — TSH: TSH: 1.863 u[IU]/mL (ref 0.350–4.500)

## 2017-07-02 LAB — ECHOCARDIOGRAM COMPLETE
HEIGHTINCHES: 61 in
WEIGHTICAEL: 1883.2 [oz_av]

## 2017-07-02 LAB — TROPONIN I
TROPONIN I: 0.15 ng/mL — AB (ref <0.03)
Troponin I: 0.12 ng/mL (ref <0.03)

## 2017-07-02 MED ORDER — SODIUM BICARBONATE 650 MG PO TABS
650.0000 mg | ORAL_TABLET | Freq: Three times a day (TID) | ORAL | Status: AC
  Administered 2017-07-02 (×3): 650 mg via ORAL
  Filled 2017-07-02 (×3): qty 1

## 2017-07-02 NOTE — Evaluation (Signed)
Physical Therapy Evaluation Patient Details Name: Amanda Jackson MRN: [id number] DOB: 1940-07-07 Today's Date: 07/02/2017   History of Present Illness  Pt is a 77 y/o female with medical history significant of hypertension, vocal fold paralysis/hoarseness, allergic rhinitis, anxiety, GERD.  Patient presented to the emergency department secondary to persistent substernal chest pain. Troponin levels are flat (0.15>0.12). Cardiology has been consulted. Pulm also consulted for L lung mass.    Clinical Impression  Pt presented supine in bed with HOB elevated, awake and willing to participate in therapy session. Prior to admission, pt reported that she was independent with all functional mobility and ADLs. Pt currently at modified independence for bed mobility and transfers. Pt ambulated in hallway without use of an AD with supervision for safety; no LOB or need for physical assistance with ambulation. Pt reported that she feels she is at her baseline in regards to functional mobility. No further acute PT needs identified at this time. PT signing off.     Follow Up Recommendations No PT follow up    Equipment Recommendations  None recommended by PT    Recommendations for Other Services       Precautions / Restrictions Precautions Precautions: Fall Restrictions Weight Bearing Restrictions: No      Mobility  Bed Mobility Overal bed mobility: Modified Independent                Transfers Overall transfer level: Modified independent Equipment used: None                Ambulation/Gait Ambulation/Gait assistance: Supervision Ambulation Distance (Feet): 250 Feet Assistive device: None Gait Pattern/deviations: Step-through pattern;Decreased stride length Gait velocity: decreased Gait velocity interpretation: Below normal speed for age/gender General Gait Details: pt with steady gait, appropriately cautious in hallway with obstacles; no overt LOB or need for physical  assistance, supervision for safety  Stairs            Wheelchair Mobility    Modified Rankin (Stroke Patients Only)       Balance Overall balance assessment: Needs assistance Sitting-balance support: Feet supported Sitting balance-Leahy Scale: Good     Standing balance support: During functional activity;No upper extremity supported Standing balance-Leahy Scale: Good               High level balance activites: Direction changes;Turns;Head turns High Level Balance Comments: supervision for safety; pt slightly unsteady with vertical head turns (specifically looking up) but no overt LOB, able to self-correct/adjust             Pertinent Vitals/Pain Pain Assessment: Faces Faces Pain Scale: No hurt    Home Living Family/patient expects to be discharged to:: Private residence Living Arrangements: Spouse/significant other Available Help at Discharge: Family;Friend(s);Available 24 hours/day Type of Home: House Home Access: Stairs to enter Entrance Stairs-Rails: Psychiatric nurse of Steps: 12(6 and then a landing and 6 more) Home Layout: Two level Home Equipment: None      Prior Function Level of Independence: Independent               Hand Dominance        Extremity/Trunk Assessment   Upper Extremity Assessment Upper Extremity Assessment: Defer to OT evaluation    Lower Extremity Assessment Lower Extremity Assessment: Overall WFL for tasks assessed       Communication   Communication: No difficulties  Cognition Arousal/Alertness: Awake/alert Behavior During Therapy: WFL for tasks assessed/performed Overall Cognitive Status: Within Functional Limits for tasks assessed  General Comments      Exercises     Assessment/Plan    PT Assessment Patent does not need any further PT services  PT Problem List         PT Treatment Interventions      PT Goals (Current  goals can be found in the Care Plan section)  Acute Rehab PT Goals Patient Stated Goal: return home    Frequency     Barriers to discharge        Co-evaluation               AM-PAC PT "6 Clicks" Daily Activity  Outcome Measure Difficulty turning over in bed (including adjusting bedclothes, sheets and blankets)?: None Difficulty moving from lying on back to sitting on the side of the bed? : None Difficulty sitting down on and standing up from a chair with arms (e.g., wheelchair, bedside commode, etc,.)?: None Help needed moving to and from a bed to chair (including a wheelchair)?: None Help needed walking in hospital room?: None Help needed climbing 3-5 steps with a railing? : A Little 6 Click Score: 23    End of Session   Activity Tolerance: Patient tolerated treatment well Patient left: in bed;with call bell/phone within reach Nurse Communication: Mobility status PT Visit Diagnosis: Other abnormalities of gait and mobility (R26.89)    Time: 1345-1401 PT Time Calculation (min) (ACUTE ONLY): 16 min   Charges:   PT Evaluation $PT Eval Low Complexity: 1 Low     PT G Codes:        [place], PT, DPT [place] 07/02/2017, 2:11 PM

## 2017-07-02 NOTE — Progress Notes (Signed)
  Echocardiogram 2D Echocardiogram has been performed.  Amanda Jackson T Bevan Vu 07/02/2017, 10:06 AM

## 2017-07-02 NOTE — Progress Notes (Signed)
OT Cancellation Note  Patient Details Name: Amanda Jackson MRN: [id number] DOB: 05-06-1940   Cancelled Treatment:    Reason Eval/Treat Not Completed: Spoke with PT and then again with [name] and husband. OT screened, no needs identified, will sign off.   [place] 07/02/2017, 4:38 PM  Hulda Humphrey OTR/L [phone]

## 2017-07-02 NOTE — Consult Note (Addendum)
Cardiology Consult    Patient ID: Amanda Jackson MRN: [id number], DOB/AGE: [birth year]   Admit date: 07/01/2017 Date of Consult: 07/02/2017  Primary Physician: Midge Minium, MD Primary Cardiologist: Dr. Meda Coffee Requesting Provider: Dr. Lonny Prude Reason for Consultation: Chest pain  Amanda Jackson is a 77 y.o. female who is being seen today for the evaluation of chest pain at the request of Dr. Lonny Prude.   Patient Profile    77 yo female with PMH of HTN, HL, GERD, asthma, and MVP who presented with syncopal episode and chest pain.   Past Medical History   Past Medical History:  Diagnosis Date  . Allergy   . Anxiety   . Arthritis   . Asthma   . Atypical chest pain    Normal Myoview 2008  . Colon polyp   . GERD (gastroesophageal reflux disease)   . Hx of colonoscopy 03/18/09  . Hyperlipidemia   . Hypertension   . Left nasal polyps   . MVP (mitral valve prolapse)   . Seasonal allergies   . Tubulovillous adenoma     Past Surgical History:  Procedure Laterality Date  . ABDOMINAL HYSTERECTOMY    . BREAST SURGERY  2010   biopsy-benign  . BUNIONECTOMY    . COLONOSCOPY    . FUNCTIONAL ENDOSCOPIC SINUS SURGERY  08/28/08   with bilateral ethmoidectomies and bilateral maxillary sinus ostial enlargement with removal of mucocyst, mucous membrane, and mucoid material  . KNEE ARTHROSCOPY W/ LATERAL RELEASE  11/17/99   Left  . KNEE ARTHROSCOPY W/ PARTIAL MEDIAL MENISCECTOMY  11/17/99   left  . Laparoscopic Right Colectomy  01/25/03  . nasal polyp removal    . POLYPECTOMY    . TONSILLECTOMY  1952     Allergies  Allergies  Allergen Reactions  . Tiotropium Swelling    Facial and lip swelling,     History of Present Illness    Mrs. Amanda Jackson is a 77 yo female with PMH of HTN, HL, GERD, asthma, and MVP. She is followed by Dr. Meda Coffee as an outpatient. Had a normal stress test back in 2008 but no work up since that time.  She has been using SL nitro at home for intermittent episodes  of chest pain for some time. When last seen by Dr. Meda Coffee, it was noted to consider coronary CT if symptoms persisted.   She reports several days of chest pressure over the past week. On Thursday took 2SL nitro and had a syncopal episode at home but did not seek out medical attention until the following day. Presented to the ED with symptoms.   Labs in the ED showed stable electrolytes, Trop 0.16>>0.15>>0.12. EKG showed SR with no ischemia. Did have a CT chest that showed a left upper lobe mass concerning for malignancy.   Inpatient Medications    . amLODipine  5 mg Oral Daily  . aspirin EC  81 mg Oral Daily  . budesonide  0.5 mg Nebulization BID  . carvedilol  12.5 mg Oral BID WC  . enoxaparin (LOVENOX) injection  40 mg Subcutaneous Q24H  . folic acid  1 mg Oral Daily  . LORazepam  1 mg Oral BID  . losartan  50 mg Oral Daily  . montelukast  10 mg Oral QHS  . pantoprazole  40 mg Oral Daily  . potassium chloride SA  20 mEq Oral Daily  . sodium bicarbonate  650 mg Oral TID  . spironolactone  25 mg Oral Daily  . vitamin  B-12  2,500 mcg Oral Daily    Family History    Family History  Problem Relation Age of Onset  . Hypertension Son   . Lung cancer Father   . Cancer Father   . Hypertension Father   . Liver cancer Mother   . Cancer Mother   . Hypertension Mother   . Colon cancer Mother   . Colon cancer Sister   . Heart murmur Son   . Stomach cancer Neg Hx   . Ulcerative colitis Neg Hx   . Allergic rhinitis Neg Hx   . Angioedema Neg Hx   . Asthma Neg Hx     Social History    Social History   Socioeconomic History  . Marital status: Divorced    Spouse name: Not on file  . Number of children: Not on file  . Years of education: Not on file  . Highest education level: Not on file  Social Needs  . Financial resource strain: Not on file  . Food insecurity - worry: Not on file  . Food insecurity - inability: Not on file  . Transportation needs - medical: Not on file  .  Transportation needs - non-medical: Not on file  Occupational History  . Occupation: Retired    Comment: English Professor  Tobacco Use  . Smoking status: Former Smoker    Last attempt to quit: 09/09/1977    Years since quitting: 39.8  . Smokeless tobacco: Never Used  . Tobacco comment: used to smoke socially. Quit in 1979.  Substance and Sexual Activity  . Alcohol use: Yes    Comment: usually has a glass of wine with dinner every evening  . Drug use: No  . Sexual activity: Not Currently    Birth control/protection: Other-see comments    Comment: HYST  Other Topics Concern  . Not on file  Social History Narrative  . Not on file     Review of Systems    See HPI  All other systems reviewed and are otherwise negative except as noted above.  Physical Exam    Blood pressure (!) 148/74, pulse 88, temperature 97.8 F (36.6 C), temperature source Oral, resp. rate 18, height 5\' 1"  (1.549 m), weight 117 lb 11.2 oz (53.4 kg), SpO2 95 %.  General: Pleasant, thin older AAF NAD Psych: Normal affect. Neuro: Alert and oriented X 3. Moves all extremities spontaneously. HEENT: Normal  Neck: Supple, no JVD. Lungs:  Resp regular and unlabored, CTA. Heart: RRR no s3, s4, 2/6 systolic murmurs. Abdomen: Soft, non-tender, non-distended, BS + x 4.  Extremities: No clubbing, cyanosis or edema. DP/PT/Radials 2+ and equal bilaterally.  Labs    Troponin [facility] Test) Recent Labs    07/01/17 1455  TROPIPOC 0.16*   Recent Labs    07/02/17 0209 07/02/17 0657  TROPONINI 0.15* 0.12*   Lab Results  Component Value Date   WBC 9.8 07/02/2017   HGB 12.7 07/02/2017   HCT 38.5 07/02/2017   MCV 94.1 07/02/2017   PLT 299 07/02/2017    Recent Labs  Lab 07/02/17 0657  NA 137  K 3.7  CL 109  CO2 18*  BUN 12  CREATININE 0.62  CALCIUM 9.2  GLUCOSE 113*   Lab Results  Component Value Date   CHOL 140 02/03/2017   HDL 61.30 02/03/2017   LDLCALC 61 02/03/2017   TRIG 92.0  02/03/2017   No results found for: [facility]   Radiology Studies    Dg Chest 2  View  Result Date: 07/01/2017 CLINICAL DATA:  Left-sided chest pain EXAM: CHEST - 2 VIEW COMPARISON:  11/15/2014 FINDINGS: Cardiac shadow is within normal limits. The aorta is unremarkable. Right lung is clear. Left lung demonstrates fullness in the left suprahilar region extending into the upper lobe. No other focal infiltrate is seen. No sizable effusion is noted. IMPRESSION: Left suprahilar density with upper lobe consolidation. CT of the chest with contrast is recommended to rule out underlying centrally obstructing lesion. Electronically Signed   By: Inez Catalina M.D.   On: 07/01/2017 15:22   Ct Angio Chest Pe W And/or Wo Contrast  Result Date: 07/01/2017 CLINICAL DATA:  77 year old female with acute chest pain and shortness of breath for 1 day. EXAM: CT ANGIOGRAPHY CHEST WITH CONTRAST TECHNIQUE: Multidetector CT imaging of the chest was performed using the standard protocol during bolus administration of intravenous contrast. Multiplanar CT image reconstructions and MIPs were obtained to evaluate the vascular anatomy. CONTRAST:  177mL ISOVUE-370 IOPAMIDOL (ISOVUE-370) INJECTION 76% COMPARISON:  07/01/2017 radiographs, 11/21/2014 abdominal CT and other studies. FINDINGS: Cardiovascular: This is a technically satisfactory study. No pulmonary emboli are identified. There is no evidence of thoracic aortic aneurysm. UPPER limits normal heart size noted with small pericardial effusion. Mediastinum/Nodes: Ill-defined soft tissue/adenopathy throughout the LEFT mediastinum, LEFT hilum and subcarinal region noted. Visualized thyroid gland is unremarkable. Lungs/Pleura: Masslike consolidation throughout the majority of the LEFT UPPER lobe noted and highly suspicious for central malignancy with likely some degree of atelectasis. The entire mass-like consolidation and atelectasis measures 7 x 8.6 x 11.2 cm. Narrowing of the LEFT  UPPER/central pulmonary arteries noted. A small LEFT pleural effusion is noted. The RIGHT lung is clear. Upper Abdomen: No acute abnormality. Multiple hepatic cysts are unchanged from 2016. No definite new hepatic abnormalities noted. Musculoskeletal: No acute bony abnormalities or suspicious focal bony lesions. Review of the MIP images confirms the above findings. IMPRESSION: 1. Masslike consolidation with probable atelectasis of majority of the LEFT UPPER lobe highly suspicious for malignancy with some degree of atelectasis. Ill-defined soft tissue/adenopathy throughout the LEFT mediastinum, LEFT hilum and subcarinal regions. Associated small LEFT pleural effusion. Electronically Signed   By: Margarette Canada M.D.   On: 07/01/2017 17:04    ECG & Cardiac Imaging    EKG:  The EKG was personally reviewed and demonstrates SR without ischemia.  Echo: pending  Assessment & Plan    77 yo female with PMH of HTN, HL, GERD, asthma, and MVP who presented with syncopal episode and chest pain.   1. Chest pain: she has been using SL nitro for episodes of chest pressure for sometime now. When last seen in the office it was noted Dr. Meda Coffee recommended further work up with a coronary CT if symptoms persisted. EKG non acute. Trop 0.16>>0.15>>0.12, could be demand ischemia in the setting of syncope but need to rule out ACS. Will discuss with MD regarding CT vs cath. Will need to determine plan with this mass on her lung before considering invasive therapy. -- echo pending  2. Syncope: reported after taking 2SL nitro. Had struggled with low pressures in the past after using SL nitro.   3. Mass on left lung: seen on CT chest. Pulmonary to see.   4. HTN: stable with current therapy.  Barnet Pall, NP-C Pager [phone] 07/02/2017, 10:50 AM  Attending note  Patient seen and discussed with PA Mancel Bale, I agree with her documentation above. 77 yo female history of HTN, chronic atypical chest pain with  prior negative stress test, admitted with chest pain    K 3.9 Cr 0.68 WBC 12.3 Plt 313 TSH 1.86 Trop 0.16-->0.15-->0.12--> CXR left suprahilar density upper lobe CT PE: no PE, masslike conodlidation LUL suspicous for malignancy Echo pending EKG SR, no ischemic changes  Patient with long history of atypical chest pain. Presents with chest pain symptoms with mild troponin. New diagnosis of lung mass with pulm consulted. We will f/u echo today, consider either nuclear stress vs coronary CTA vs cath for ischemic evaluation likely pending echo results (from clinic note her primary cardiologist was considering coronary CTA if needed).    Carlyle Dolly MD

## 2017-07-02 NOTE — Progress Notes (Signed)
PROGRESS NOTE    Amanda Jackson  [id number] DOB: 09/28/1940 DOA: 07/01/2017 PCP: Midge Minium, MD   Brief Narrative: Amanda Jackson is a 77 y.o. female with medical history significant of hypertension, vocal fold paralysis/hoarseness, allergic rhinitis, anxiety, GERD. She presented with chest pain and syncope. ACS workup significant for elevated troponin. Cardiology consulted. Incidentally, she was found to have a lung mass concerning for malignancy. Pulmonology consulted and will follow-up as an outpatient.   Assessment & Plan:   Principal Problem:   Chest pain Active Problems:   Essential hypertension   Anxiety   GERD (gastroesophageal reflux disease)   Syncope   Hoarseness of voice   Lung mass   Atypical chest pain Heart score of 5. Chest pain resolved now. Possibly GI related as patient has history of GERD. EKG unremarkable. Troponin elevated and trended down. Echocardiogram without significant structural/functional abnormalities. -Cardiology recommendations: CTA vs cath vs stress test  Mass-like consolidation Concerning for cancer. Patient with mildly elevated WBC with chills. Could possibly be an abscess but clinical suspicion is very low. Cough is chronic and non-productive. WBC resolved without antibiotics. -Pulmonology consulted: outpatient follow-up  Syncope Likely vasovagal vs secondary to nitroglycerin. Loss of consciousness without head injury.   Essential hypertension Well controlled -Continue amlodipine, losartan, spironolactone  Anxiety Stable -Continue Ativan BID  GERD Patient takes omeprazole as an outpatient -Protonix BID  Persistent cough Allergies Follows with allergist. -Continue Singulair, Pulmicort, Flonase  Hoarseness Patient with diagnosis of vocal fold paralysis. Sees ENT as an outpatient.    DVT prophylaxis: Lovenox Code Status:   Code Status: Full Code Family Communication: None at bedside Disposition Plan:  Discharge pending cardiology   Consultants:   Cardiology  Pulmonology  Procedures:   Transthoracic Echocardiogram (07/02/2017) Study Conclusions  - Left ventricle: The cavity size was normal. Wall thickness was   normal. Systolic function was normal. The estimated ejection   fraction was in the range of 55% to 60%. Wall motion was normal;   there were no regional wall motion abnormalities. Doppler   parameters are consistent with abnormal left ventricular   relaxation (grade 1 diastolic dysfunction). - Pulmonary arteries: Systolic pressure was mildly increased. PA   peak pressure: 32 mm Hg (S).  Antimicrobials:  None    Subjective: Some chest discomfort today.  Objective: Vitals:   07/01/17 1830 07/01/17 1949 07/01/17 2035 07/02/17 0843  BP: 136/83 (!) 148/74    Pulse: 85 90 88   Resp: 17 18 18    Temp:      TempSrc:      SpO2: 96% 99%  95%  Weight:  53.4 kg (117 lb 11.2 oz)    Height:        Intake/Output Summary (Last 24 hours) at 07/02/2017 1326 Last data filed at 07/01/2017 2000 Gross per 24 hour  Intake 360 ml  Output -  Net 360 ml   Filed Weights   07/01/17 1604 07/01/17 1949  Weight: 52.6 kg (116 lb) 53.4 kg (117 lb 11.2 oz)    Examination:  General exam: Appears calm and comfortable Respiratory system: Clear to auscultation. Respiratory effort normal. Cardiovascular system: S1 & S2 heard, RRR. No murmurs, rubs, gallops or clicks. Gastrointestinal system: Abdomen is nondistended, soft and nontender. No organomegaly or masses felt. Normal bowel sounds heard. Central nervous system: Alert and oriented. No focal neurological deficits. Extremities: No edema. No calf tenderness Skin: No cyanosis. No rashes Psychiatry: Judgement and insight appear normal. Mood & affect appropriate.  Data Reviewed: I have personally reviewed following labs and imaging studies  CBC: Recent Labs  Lab 07/01/17 1432 07/02/17 0657  WBC 12.3* 9.8  HGB 12.7 12.7    HCT 39.2 38.5  MCV 93.1 94.1  PLT 313 035   Basic Metabolic Panel: Recent Labs  Lab 07/01/17 1432 07/02/17 0657  NA 136 137  K 3.9 3.7  CL 107 109  CO2 20* 18*  GLUCOSE 104* 113*  BUN 13 12  CREATININE 0.68 0.62  CALCIUM 9.4 9.2   GFR: Estimated Creatinine Clearance: 45.1 mL/min (by C-G formula based on SCr of 0.62 mg/dL). Liver Function Tests: No results for input(s): AST, ALT, ALKPHOS, BILITOT, PROT, ALBUMIN in the last 168 hours. No results for input(s): LIPASE, AMYLASE in the last 168 hours. No results for input(s): AMMONIA in the last 168 hours. Coagulation Profile: No results for input(s): INR, PROTIME in the last 168 hours. Cardiac Enzymes: Recent Labs  Lab 07/02/17 0209 07/02/17 0657  TROPONINI 0.15* 0.12*   BNP (last 3 results) No results for input(s): PROBNP in the last 8760 hours. HbA1C: No results for input(s): HGBA1C in the last 72 hours. CBG: No results for input(s): GLUCAP in the last 168 hours. Lipid Profile: No results for input(s): CHOL, HDL, LDLCALC, TRIG, CHOLHDL, LDLDIRECT in the last 72 hours. Thyroid Function Tests: Recent Labs    07/02/17 0705  TSH 1.863   Anemia Panel: No results for input(s): VITAMINB12, FOLATE, FERRITIN, TIBC, IRON, RETICCTPCT in the last 72 hours. Sepsis Labs: Recent Labs  Lab 07/01/17 1951  PROCALCITON 0.50    No results found for this or any previous visit (from the past 240 hour(s)).       Radiology Studies: Dg Chest 2 View  Result Date: 07/01/2017 CLINICAL DATA:  Left-sided chest pain EXAM: CHEST - 2 VIEW COMPARISON:  11/15/2014 FINDINGS: Cardiac shadow is within normal limits. The aorta is unremarkable. Right lung is clear. Left lung demonstrates fullness in the left suprahilar region extending into the upper lobe. No other focal infiltrate is seen. No sizable effusion is noted. IMPRESSION: Left suprahilar density with upper lobe consolidation. CT of the chest with contrast is recommended to rule out  underlying centrally obstructing lesion. Electronically Signed   By: Inez Catalina M.D.   On: 07/01/2017 15:22   Ct Angio Chest Pe W And/or Wo Contrast  Result Date: 07/01/2017 CLINICAL DATA:  77 year old female with acute chest pain and shortness of breath for 1 day. EXAM: CT ANGIOGRAPHY CHEST WITH CONTRAST TECHNIQUE: Multidetector CT imaging of the chest was performed using the standard protocol during bolus administration of intravenous contrast. Multiplanar CT image reconstructions and MIPs were obtained to evaluate the vascular anatomy. CONTRAST:  168mL ISOVUE-370 IOPAMIDOL (ISOVUE-370) INJECTION 76% COMPARISON:  07/01/2017 radiographs, 11/21/2014 abdominal CT and other studies. FINDINGS: Cardiovascular: This is a technically satisfactory study. No pulmonary emboli are identified. There is no evidence of thoracic aortic aneurysm. UPPER limits normal heart size noted with small pericardial effusion. Mediastinum/Nodes: Ill-defined soft tissue/adenopathy throughout the LEFT mediastinum, LEFT hilum and subcarinal region noted. Visualized thyroid gland is unremarkable. Lungs/Pleura: Masslike consolidation throughout the majority of the LEFT UPPER lobe noted and highly suspicious for central malignancy with likely some degree of atelectasis. The entire mass-like consolidation and atelectasis measures 7 x 8.6 x 11.2 cm. Narrowing of the LEFT UPPER/central pulmonary arteries noted. A small LEFT pleural effusion is noted. The RIGHT lung is clear. Upper Abdomen: No acute abnormality. Multiple hepatic cysts are unchanged from 2016. No  definite new hepatic abnormalities noted. Musculoskeletal: No acute bony abnormalities or suspicious focal bony lesions. Review of the MIP images confirms the above findings. IMPRESSION: 1. Masslike consolidation with probable atelectasis of majority of the LEFT UPPER lobe highly suspicious for malignancy with some degree of atelectasis. Ill-defined soft tissue/adenopathy throughout the  LEFT mediastinum, LEFT hilum and subcarinal regions. Associated small LEFT pleural effusion. Electronically Signed   By: Margarette Canada M.D.   On: 07/01/2017 17:04        Scheduled Meds: . amLODipine  5 mg Oral Daily  . aspirin EC  81 mg Oral Daily  . budesonide  0.5 mg Nebulization BID  . carvedilol  12.5 mg Oral BID WC  . enoxaparin (LOVENOX) injection  40 mg Subcutaneous Q24H  . folic acid  1 mg Oral Daily  . LORazepam  1 mg Oral BID  . losartan  50 mg Oral Daily  . montelukast  10 mg Oral QHS  . pantoprazole  40 mg Oral Daily  . potassium chloride SA  20 mEq Oral Daily  . sodium bicarbonate  650 mg Oral TID  . spironolactone  25 mg Oral Daily  . vitamin B-12  2,500 mcg Oral Daily   Continuous Infusions:   LOS: 0 days     Cordelia Poche, MD [facility] 07/02/2017, 1:26 PM Pager: [phone]  If 7PM-7AM, please contact night-coverage [email or web address] Password [email or web address] 07/02/2017, 1:26 PM

## 2017-07-02 NOTE — Consult Note (Signed)
PULMONARY / CRITICAL CARE MEDICINE   Name: Amanda Jackson MRN: [id number] DOB: 1941/01/24    ADMISSION DATE:  07/01/2017 CONSULTATION DATE:  07/02/2017  REFERRING MD:  Dr. Lonny Prude, [facility]  CHIEF COMPLAINT:  Chest pain  HISTORY OF PRESENT ILLNESS:   77 yo female former smoker presented with chest pain.  As part of her evaluation she had chest xray that showed a left suprahilar density.  She then had CT chest that showed a mass like consolidation in Lt upper lobe and mediastinal and Lt hilar adenopathy.  She is noted to have dysphonia since November 2018.  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  She was advised to have a CT neck to further assess, but wasn't able to get this scheduled.  She hasn't had a recent chest xray before this admission.  She is followed by allergist for asthma.  She has dry cough.  She denies hemoptysis.  Her weight has been steady.  She gets sweats at night.  She is from [place].  She quit smoking years ago.  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  Denies history of PNA or TB.  No animal/bird exposures.    PAST MEDICAL HISTORY :  She  has a past medical history of Allergy, Anxiety, Arthritis, Asthma, Atypical chest pain, Colon polyp, GERD (gastroesophageal reflux disease), colonoscopy (03/18/09), Hyperlipidemia, Hypertension, Left nasal polyps, MVP (mitral valve prolapse), Seasonal allergies, and Tubulovillous adenoma.  PAST SURGICAL HISTORY: She  has a past surgical history that includes Laparoscopic Right Colectomy (01/25/03); Knee arthroscopy w/ lateral release (11/17/99); Knee arthroscopy w/ partial medial meniscectomy (11/17/99); Functional endoscopic sinus surgery (08/28/08); Abdominal hysterectomy; Breast surgery (2010); Tonsillectomy (1952); Polypectomy; Colonoscopy; Bunionectomy; and nasal polyp removal.  Allergies  Allergen Reactions  . Tiotropium Swelling    Facial and lip swelling,     No current facility-administered medications on  file prior to encounter.    Current Outpatient Medications on File Prior to Encounter  Medication Sig  . acetaminophen (TYLENOL ARTHRITIS PAIN) 650 MG CR tablet Take 650 mg by mouth every 8 (eight) hours as needed for pain.  Marland Kitchen albuterol (PROVENTIL HFA;VENTOLIN HFA) 108 (90 Base) MCG/ACT inhaler INHALE TWO PUFFS EVERY 4 HOURS AS NEEDED FOR COUGH OR WHEEZE.  Marland Kitchen amLODipine (NORVASC) 5 MG tablet Take 1 tablet (5 mg total) by mouth daily.  Marland Kitchen aspirin 81 MG EC tablet Take 81 mg by mouth daily.    . budesonide (PULMICORT) 0.5 MG/2ML nebulizer solution Mix budesonide/saline nasal irrigation twice a day. (Patient taking differently: 0.5 mg See admin instructions. Mix budesonide (0.5 mg) with powdered OTC saline sinus rinse and approx 8 oz sterile water, squirt into both nostrils twice a day.)  . Calcium Carb-Cholecalciferol (CALCIUM 600+D3 PO) Take 600 mg by mouth 2 (two) times daily.   . carvedilol (COREG) 12.5 MG tablet TAKE ONE TABLET BY MOUTH TWICE DAILY WITH A MEAL (Patient taking differently: TAKE ONE TABLET (12.5 MG) BY MOUTH TWICE DAILY WITH A MEAL)  . Cyanocobalamin (VITAMIN B-12) 2500 MCG SUBL Place 2,500 mcg under the tongue daily.   . fluticasone (FLOVENT HFA) 220 MCG/ACT inhaler Inhale 2 puffs 2 (two) times daily into the lungs. (Patient taking differently: Inhale 3 puffs into the lungs 2 (two) times daily. )  . FOLBIC 2.5-25-2 MG TABS tablet TAKE 1 TABLET BY MOUTH EVERY DAY  . ibuprofen (ADVIL,MOTRIN) 800 MG tablet TAKE 1 TABLET (800 MG TOTAL) BY MOUTH 3 (THREE) TIMES DAILY  AS NEEDED FOR PAIN  . KLOR-CON M20 20 MEQ tablet TAKE 1 TABLET BY MOUTH EVERY DAY (Patient taking differently: TAKE 1 TABLET (20 MEQ)  BY MOUTH EVERY DAY)  . LORazepam (ATIVAN) 1 MG tablet TAKE 1 TABLET BY MOUTH TWICE A DAY (Patient taking differently: TAKE 1 TABLET (1 MG) BY MOUTH TWICE A DAY)  . losartan (COZAAR) 50 MG tablet TAKE 1 TABLET BY MOUTH EVERY DAY (Patient taking differently: TAKE 1 TABLET (50 MG)  BY MOUTH EVERY  DAY)  . montelukast (SINGULAIR) 10 MG tablet TAKE 1 TABLET BY MOUTH EVERY DAY (Patient taking differently: TAKE 1 TABLET (10 MG) BY MOUTH EVERY DAY AT BEDTIME)  . Multiple Vitamin (MULTIVITAMIN WITH MINERALS) TABS tablet Take 1 tablet by mouth daily. Centrum  . nitroGLYCERIN (NITROSTAT) 0.4 MG SL tablet Place 1 tablet (0.4 mg total) under the tongue every 5 (five) minutes as needed. For chest pain (patient unsure of MAX doses allowed) (Patient taking differently: Place 0.4 mg under the tongue every 5 (five) minutes as needed for chest pain. )  . omeprazole (PRILOSEC) 20 MG capsule TAKE 1 CAPSULE (20 MG TOTAL) BY MOUTH 2 (TWO) TIMES DAILY. (Patient taking differently: Take 20 mg by mouth 2 (two) times daily as needed (chest pain/acid reflux). )  . spironolactone (ALDACTONE) 25 MG tablet TAKE 1 TABLET (25 MG TOTAL) BY MOUTH DAILY. PLEASE KEEP 01/11/17 APPT FOR FURHTER REFILLS (Patient taking differently: Take 25 mg by mouth daily. )  . traMADol (ULTRAM) 50 MG tablet Take 50 mg by mouth daily as needed (knee pain). Reported on 07/23/2015  . triamcinolone cream (KENALOG) 0.1 % 1 APPLICATION TO AFFECTED AREA TWICE A DAY AS NEEDED FOR RED SPOTS ON BACK  . Beclomethasone Dipropionate (QNASL) 80 MCG/ACT AERS ONE SPRAY EACH NOSTRIL TWICE DAILY FOR STUFFY NOSE OR DRAINAGE. (Patient not taking: Reported on 07/01/2017)    FAMILY HISTORY:  Her indicated that her mother is deceased. She indicated that her father is deceased. She indicated that her sister is alive. She indicated that only one of her two sons is alive. She indicated that the status of her neg hx is unknown.   SOCIAL HISTORY: She  reports that she quit smoking about 39 years ago. she has never used smokeless tobacco. She reports that she drinks alcohol. She reports that she does not use drugs.  REVIEW OF SYSTEMS:   12 point ROS negative except above.  SUBJECTIVE:   VITAL SIGNS: BP (!) 148/74 (BP Location: Right Arm)   Pulse 88   Temp 97.8 F  (36.6 C) (Oral)   Resp 18   Ht 5\' 1"  (1.549 m)   Wt 117 lb 11.2 oz (53.4 kg)   SpO2 95%   BMI 22.24 kg/m   INTAKE / OUTPUT: I/O last 3 completed shifts: In: 360 [P.O.:360] Out: -   PHYSICAL EXAMINATION:  General - pleasant Eyes - pupils reactive ENT - no sinus tenderness, no oral exudate, no LAN, hoarse voice Cardiac - regular, no murmur Chest - decreased BS Lt upper lung field Abd - soft, non tender Ext - no edema Skin - no rashes Neuro - normal strength Psych - normal mood   LABS:  BMET Recent Labs  Lab 07/01/17 1432 07/02/17 0657  NA 136 137  K 3.9 3.7  CL 107 109  CO2 20* 18*  BUN 13 12  CREATININE 0.68 0.62  GLUCOSE 104* 113*    Electrolytes Recent Labs  Lab 07/01/17 1432 07/02/17 0657  CALCIUM 9.4 9.2  CBC Recent Labs  Lab 07/01/17 1432 07/02/17 0657  WBC 12.3* 9.8  HGB 12.7 12.7  HCT 39.2 38.5  PLT 313 299    Coag's No results for input(s): APTT, INR in the last 168 hours.  Sepsis Markers Recent Labs  Lab 07/01/17 1951  PROCALCITON 0.50    ABG No results for input(s): PHART, PCO2ART, PO2ART in the last 168 hours.  Liver Enzymes No results for input(s): AST, ALT, ALKPHOS, BILITOT, ALBUMIN in the last 168 hours.  Cardiac Enzymes Recent Labs  Lab 07/02/17 0209 07/02/17 0657  TROPONINI 0.15* 0.12*    Glucose No results for input(s): GLUCAP in the last 168 hours.  Imaging Dg Chest 2 View  Result Date: 07/01/2017 CLINICAL DATA:  Left-sided chest pain EXAM: CHEST - 2 VIEW COMPARISON:  11/15/2014 FINDINGS: Cardiac shadow is within normal limits. The aorta is unremarkable. Right lung is clear. Left lung demonstrates fullness in the left suprahilar region extending into the upper lobe. No other focal infiltrate is seen. No sizable effusion is noted. IMPRESSION: Left suprahilar density with upper lobe consolidation. CT of the chest with contrast is recommended to rule out underlying centrally obstructing lesion. Electronically  Signed   By: Inez Catalina M.D.   On: 07/01/2017 15:22   Ct Angio Chest Pe W And/or Wo Contrast  Result Date: 07/01/2017 CLINICAL DATA:  77 year old female with acute chest pain and shortness of breath for 1 day. EXAM: CT ANGIOGRAPHY CHEST WITH CONTRAST TECHNIQUE: Multidetector CT imaging of the chest was performed using the standard protocol during bolus administration of intravenous contrast. Multiplanar CT image reconstructions and MIPs were obtained to evaluate the vascular anatomy. CONTRAST:  122mL ISOVUE-370 IOPAMIDOL (ISOVUE-370) INJECTION 76% COMPARISON:  07/01/2017 radiographs, 11/21/2014 abdominal CT and other studies. FINDINGS: Cardiovascular: This is a technically satisfactory study. No pulmonary emboli are identified. There is no evidence of thoracic aortic aneurysm. UPPER limits normal heart size noted with small pericardial effusion. Mediastinum/Nodes: Ill-defined soft tissue/adenopathy throughout the LEFT mediastinum, LEFT hilum and subcarinal region noted. Visualized thyroid gland is unremarkable. Lungs/Pleura: Masslike consolidation throughout the majority of the LEFT UPPER lobe noted and highly suspicious for central malignancy with likely some degree of atelectasis. The entire mass-like consolidation and atelectasis measures 7 x 8.6 x 11.2 cm. Narrowing of the LEFT UPPER/central pulmonary arteries noted. A small LEFT pleural effusion is noted. The RIGHT lung is clear. Upper Abdomen: No acute abnormality. Multiple hepatic cysts are unchanged from 2016. No definite new hepatic abnormalities noted. Musculoskeletal: No acute bony abnormalities or suspicious focal bony lesions. Review of the MIP images confirms the above findings. IMPRESSION: 1. Masslike consolidation with probable atelectasis of majority of the LEFT UPPER lobe highly suspicious for malignancy with some degree of atelectasis. Ill-defined soft tissue/adenopathy throughout the LEFT mediastinum, LEFT hilum and subcarinal regions.  Associated small LEFT pleural effusion. Electronically Signed   By: Margarette Canada M.D.   On: 07/01/2017 17:04    DISCUSSION: 77 yo female with dysphonia from Lt vocal fold paralysis admitted with chest pain and found to have Lt upper lung mass with mediastinal and hilar adenopathy.  This is most concerning for primary lung cancer.    ASSESSMENT / PLAN:  Chest pain. - cardiology assessing  Left upper lung mass with hilar and mediastinal adenopathy. - she will eventually need bronchoscopy likely by EBUS - will need to wait until cardiac assessment is completed - ideally would like to do PET scan prior to EBUS to assist with planning biopsy and determining  if there is a more amenable approach to biopsy besides bronchoscopy - explained risks of bronchoscopy to her >> detailed as bleeding, infection, pneumothorax, and non diagnosis - explained that further pulmonary work up can be done as an outpatient  PCCM will f/u on Monday 07/04/17 if she is still in hospital.  If she is d/c home over the weekend, then our office will contact her to arrange for follow up as an outpt.  Chesley Mires, MD [facility] Pulmonary/Critical Care 07/02/2017, 12:22 PM Pager:  [phone] After 3pm call: [phone]

## 2017-07-03 DIAGNOSIS — R079 Chest pain, unspecified: Secondary | ICD-10-CM | POA: Diagnosis not present

## 2017-07-03 DIAGNOSIS — K219 Gastro-esophageal reflux disease without esophagitis: Secondary | ICD-10-CM | POA: Diagnosis not present

## 2017-07-03 DIAGNOSIS — I10 Essential (primary) hypertension: Secondary | ICD-10-CM | POA: Diagnosis not present

## 2017-07-03 DIAGNOSIS — R918 Other nonspecific abnormal finding of lung field: Secondary | ICD-10-CM | POA: Diagnosis not present

## 2017-07-03 LAB — TROPONIN I
Troponin I: 0.07 ng/mL (ref <0.03)
Troponin I: 0.05 ng/mL (ref <0.03)
Troponin I: 0.06 ng/mL (ref <0.03)

## 2017-07-03 LAB — HEMOGLOBIN A1C
Hgb A1c MFr Bld: 5.4 % (ref 4.8–5.6)
Mean Plasma Glucose: 108 mg/dL

## 2017-07-03 LAB — BASIC METABOLIC PANEL
ANION GAP: 8 (ref 5–15)
BUN: 6 mg/dL (ref 6–20)
CALCIUM: 9.2 mg/dL (ref 8.9–10.3)
CHLORIDE: 103 mmol/L (ref 101–111)
CO2: 21 mmol/L — AB (ref 22–32)
Creatinine, Ser: 0.93 mg/dL (ref 0.44–1.00)
GFR calc non Af Amer: 58 mL/min — ABNORMAL LOW (ref >60)
Glucose, Bld: 105 mg/dL — ABNORMAL HIGH (ref 65–99)
Potassium: 3.9 mmol/L (ref 3.5–5.1)
Sodium: 132 mmol/L — ABNORMAL LOW (ref 135–145)

## 2017-07-03 LAB — HEPARIN LEVEL (UNFRACTIONATED)

## 2017-07-03 MED ORDER — HEPARIN (PORCINE) IN NACL 100-0.45 UNIT/ML-% IJ SOLN
900.0000 [IU]/h | INTRAMUSCULAR | Status: DC
  Administered 2017-07-03: 600 [IU]/h via INTRAVENOUS
  Filled 2017-07-03 (×2): qty 250

## 2017-07-03 MED ORDER — ASPIRIN 81 MG PO CHEW
81.0000 mg | CHEWABLE_TABLET | ORAL | Status: AC
  Administered 2017-07-04: 81 mg via ORAL
  Filled 2017-07-03: qty 1

## 2017-07-03 MED ORDER — SODIUM CHLORIDE 0.9 % WEIGHT BASED INFUSION: 1.0000 mL/kg/h | INTRAVENOUS | Status: DC

## 2017-07-03 MED ORDER — SODIUM CHLORIDE 0.9% FLUSH: 3.0000 mL | INTRAVENOUS | Status: DC | PRN

## 2017-07-03 MED ORDER — ATORVASTATIN CALCIUM 40 MG PO TABS
40.0000 mg | ORAL_TABLET | Freq: Every day | ORAL | Status: DC
  Administered 2017-07-03 – 2017-07-07 (×5): 40 mg via ORAL
  Filled 2017-07-03 (×5): qty 1

## 2017-07-03 MED ORDER — HEPARIN BOLUS VIA INFUSION
1500.0000 [IU] | Freq: Once | INTRAVENOUS | Status: AC
  Administered 2017-07-03: 1500 [IU] via INTRAVENOUS
  Filled 2017-07-03: qty 1500

## 2017-07-03 MED ORDER — SODIUM CHLORIDE 0.9% FLUSH
3.0000 mL | Freq: Two times a day (BID) | INTRAVENOUS | Status: DC
  Administered 2017-07-03 (×2): 3 mL via INTRAVENOUS

## 2017-07-03 MED ORDER — NITROGLYCERIN 0.4 MG SL SUBL
SUBLINGUAL_TABLET | SUBLINGUAL | Status: AC
  Administered 2017-07-03: 0.4 mg
  Filled 2017-07-03: qty 1

## 2017-07-03 MED ORDER — HEPARIN BOLUS VIA INFUSION
3000.0000 [IU] | Freq: Once | INTRAVENOUS | Status: AC
  Administered 2017-07-03: 3000 [IU] via INTRAVENOUS
  Filled 2017-07-03: qty 3000

## 2017-07-03 MED ORDER — SODIUM CHLORIDE 0.9 % WEIGHT BASED INFUSION
3.0000 mL/kg/h | INTRAVENOUS | Status: DC
  Administered 2017-07-04: 3 mL/kg/h via INTRAVENOUS

## 2017-07-03 MED ORDER — SODIUM CHLORIDE 0.9 % IV SOLN: 250.0000 mL | INTRAVENOUS | Status: DC | PRN

## 2017-07-03 NOTE — Progress Notes (Signed)
[facility] for Heparin Indication: chest pain/ACS  Allergies  Allergen Reactions  . Tiotropium Swelling    Facial and lip swelling,     Patient Measurements: Height: 5\' 1"  (154.9 cm) Weight: 116 lb 4.8 oz (52.8 kg) IBW/kg (Calculated) : 47.8 Heparin Dosing Weight: 52.8 kg  Vital Signs: Temp: 98.4 F (36.9 C) (03/10 2031) Temp Source: Oral (03/10 2031) BP: 128/83 (03/10 2031) Pulse Rate: 92 (03/10 2031)  Labs: Recent Labs    07/01/17 1432  07/02/17 0657 07/03/17 0745 07/03/17 1437 07/03/17 1814  HGB 12.7  --  12.7  --   --   --   HCT 39.2  --  38.5  --   --   --   PLT 313  --  299  --   --   --   HEPARINUNFRC  --   --   --   --   --  <0.10*  CREATININE 0.68  --  0.62  --  0.93  --   TROPONINI  --    < > 0.12* 0.07* 0.05* 0.06*   < > = values in this interval not displayed.    Estimated Creatinine Clearance: 38.8 mL/min (by C-G formula based on SCr of 0.93 mg/dL).   Medical History: Past Medical History:  Diagnosis Date  . Allergy   . Anxiety   . Arthritis   . Asthma   . Atypical chest pain    Normal Myoview 2008  . Colon polyp   . GERD (gastroesophageal reflux disease)   . Hx of colonoscopy 03/18/09  . Hyperlipidemia   . Hypertension   . Left nasal polyps   . MVP (mitral valve prolapse)   . Seasonal allergies   . Tubulovillous adenoma     Assessment: 77 yo female with long history of atypical chest pains admitted on 3/8 for worsening chest pain. Trop were mildly elevated but trending down. Plans for cath tomorrow 3/11.    Initial heparin level < 0.10  Goal of Therapy:  Heparin level 0.3-0.7 units/ml Monitor platelets by anticoagulation protocol: Yes   Plan:  Heparin 1500 units iv x 1 Heparin drip to 750 units / hr Heparin level and CBC daily Monitor for s/sx of bleeding Follow-up post-cath tomorrow  Thank you Anette Guarneri, PharmD [phone]

## 2017-07-03 NOTE — Care Management Obs Status (Signed)
[facility] NOTIFICATION   Patient Details  Name: Amanda Jackson MRN: [id number] Date of Birth: 02-11-1941   Medicare Observation Status Notification Given:  Yes  Pt declined to sign letter   Norina Buzzard, RN 07/03/2017, 3:27 PM

## 2017-07-03 NOTE — Progress Notes (Signed)
Both the hospitalist & cardiologist on call notified of pt c/o of 6/10 mid sternal non-radiating chest discomfort. Pt given 1 SL nitro. EKG & vs in chart. Pt currently a 2/10 & stated the pain is going away. Will continue to monitor the pt. Hoover Brunette, RN

## 2017-07-03 NOTE — Progress Notes (Signed)
ANTICOAGULATION CONSULT NOTE - Initial Consult  Pharmacy Consult for Heparin Indication: chest pain/ACS  Allergies  Allergen Reactions  . Tiotropium Swelling    Facial and lip swelling,     Patient Measurements: Height: 5\' 1"  (154.9 cm) Weight: 116 lb 4.8 oz (52.8 kg) IBW/kg (Calculated) : 47.8 Heparin Dosing Weight: 52.8 kg  Vital Signs: Temp: 97.9 F (36.6 C) (03/10 0456) Temp Source: Oral (03/10 0456) BP: 111/70 (03/10 0537) Pulse Rate: 81 (03/10 0456)  Labs: Recent Labs    07/01/17 1432 07/02/17 0209 07/02/17 0657 07/03/17 0745  HGB 12.7  --  12.7  --   HCT 39.2  --  38.5  --   PLT 313  --  299  --   CREATININE 0.68  --  0.62  --   TROPONINI  --  0.15* 0.12* 0.07*    Estimated Creatinine Clearance: 45.1 mL/min (by C-G formula based on SCr of 0.62 mg/dL).   Medical History: Past Medical History:  Diagnosis Date  . Allergy   . Anxiety   . Arthritis   . Asthma   . Atypical chest pain    Normal Myoview 2008  . Colon polyp   . GERD (gastroesophageal reflux disease)   . Hx of colonoscopy 03/18/09  . Hyperlipidemia   . Hypertension   . Left nasal polyps   . MVP (mitral valve prolapse)   . Seasonal allergies   . Tubulovillous adenoma     Assessment: 77 yo female with long history of atypical chest pains admitted on 3/8 for worsening chest pain. Trop were mildly elevated but trending down. Plans for cath tomorrow 3/11.    CBC from yesterday wnl, no bleeding reported.   Goal of Therapy:  Heparin level 0.3-0.7 units/ml Monitor platelets by anticoagulation protocol: Yes   Plan:  Heparin 3000 unit bolus x 1 Heparin 600 units/hour 8-hour heparin level Heparin level and CBC daily Monitor for s/sx of bleeding Follow-up post-cath tomorrow  Leroy Libman, PharmD Pharmacy Resident Pager: [phone]

## 2017-07-03 NOTE — Progress Notes (Addendum)
Progress Note  Patient Name: Amanda Jackson Date of Encounter: 07/03/2017  Primary Cardiologist: Meda Coffee  Subjective   Episode of chest pain this AM, resolved with NG  Inpatient Medications    Scheduled Meds: . amLODipine  5 mg Oral Daily  . aspirin EC  81 mg Oral Daily  . budesonide  0.5 mg Nebulization BID  . carvedilol  12.5 mg Oral BID WC  . enoxaparin (LOVENOX) injection  40 mg Subcutaneous Q24H  . folic acid  1 mg Oral Daily  . LORazepam  1 mg Oral BID  . losartan  50 mg Oral Daily  . montelukast  10 mg Oral QHS  . pantoprazole  40 mg Oral Daily  . potassium chloride SA  20 mEq Oral Daily  . spironolactone  25 mg Oral Daily  . vitamin B-12  2,500 mcg Oral Daily   Continuous Infusions:  PRN Meds: acetaminophen, albuterol, ondansetron (ZOFRAN) IV, traMADol, zolpidem   Vital Signs    Vitals:   07/02/17 2014 07/03/17 0456 07/03/17 0531 07/03/17 0537  BP:  (!) 151/82 134/86 111/70  Pulse: 85 81    Resp: 18 18    Temp:  97.9 F (36.6 C)    TempSrc:  Oral    SpO2: 98% 93%    Weight:  116 lb 4.8 oz (52.8 kg)    Height:        Intake/Output Summary (Last 24 hours) at 07/03/2017 0928 Last data filed at 07/02/2017 1814 Gross per 24 hour  Intake 840 ml  Output -  Net 840 ml   Filed Weights   07/01/17 1604 07/01/17 1949 07/03/17 0456  Weight: 116 lb (52.6 kg) 117 lb 11.2 oz (53.4 kg) 116 lb 4.8 oz (52.8 kg)    Telemetry    SR, short run SVT - Personally Reviewed  ECG     Physical Exam   GEN: No acute distress.   Neck: No JVD Cardiac: RRR, no murmurs, rubs, or gallops.  Respiratory: mild bilateral wheezing GI: Soft, nontender, non-distended  MS: No edema; No deformity. Neuro:  Nonfocal  Psych: Normal affect   Labs    Chemistry Recent Labs  Lab 07/01/17 1432 07/02/17 0657  NA 136 137  K 3.9 3.7  CL 107 109  CO2 20* 18*  GLUCOSE 104* 113*  BUN 13 12  CREATININE 0.68 0.62  CALCIUM 9.4 9.2  GFRNONAA >60 >60  GFRAA >60 >60  ANIONGAP 9  10     Hematology Recent Labs  Lab 07/01/17 1432 07/02/17 0657  WBC 12.3* 9.8  RBC 4.21 4.09  HGB 12.7 12.7  HCT 39.2 38.5  MCV 93.1 94.1  MCH 30.2 31.1  MCHC 32.4 33.0  RDW 13.5 14.0  PLT 313 299    Cardiac Enzymes Recent Labs  Lab 07/02/17 0209 07/02/17 0657 07/03/17 0745  TROPONINI 0.15* 0.12* 0.07*    Recent Labs  Lab 07/01/17 1455  TROPIPOC 0.16*     BNPNo results for input(s): BNP, PROBNP in the last 168 hours.   DDimer No results for input(s): DDIMER in the last 168 hours.   Radiology    Dg Chest 2 View  Result Date: 07/01/2017 CLINICAL DATA:  Left-sided chest pain EXAM: CHEST - 2 VIEW COMPARISON:  11/15/2014 FINDINGS: Cardiac shadow is within normal limits. The aorta is unremarkable. Right lung is clear. Left lung demonstrates fullness in the left suprahilar region extending into the upper lobe. No other focal infiltrate is seen. No sizable effusion is noted. IMPRESSION:  Left suprahilar density with upper lobe consolidation. CT of the chest with contrast is recommended to rule out underlying centrally obstructing lesion. Electronically Signed   By: Inez Catalina M.D.   On: 07/01/2017 15:22   Ct Angio Chest Pe W And/or Wo Contrast  Result Date: 07/01/2017 CLINICAL DATA:  77 year old female with acute chest pain and shortness of breath for 1 day. EXAM: CT ANGIOGRAPHY CHEST WITH CONTRAST TECHNIQUE: Multidetector CT imaging of the chest was performed using the standard protocol during bolus administration of intravenous contrast. Multiplanar CT image reconstructions and MIPs were obtained to evaluate the vascular anatomy. CONTRAST:  145mL ISOVUE-370 IOPAMIDOL (ISOVUE-370) INJECTION 76% COMPARISON:  07/01/2017 radiographs, 11/21/2014 abdominal CT and other studies. FINDINGS: Cardiovascular: This is a technically satisfactory study. No pulmonary emboli are identified. There is no evidence of thoracic aortic aneurysm. UPPER limits normal heart size noted with small  pericardial effusion. Mediastinum/Nodes: Ill-defined soft tissue/adenopathy throughout the LEFT mediastinum, LEFT hilum and subcarinal region noted. Visualized thyroid gland is unremarkable. Lungs/Pleura: Masslike consolidation throughout the majority of the LEFT UPPER lobe noted and highly suspicious for central malignancy with likely some degree of atelectasis. The entire mass-like consolidation and atelectasis measures 7 x 8.6 x 11.2 cm. Narrowing of the LEFT UPPER/central pulmonary arteries noted. A small LEFT pleural effusion is noted. The RIGHT lung is clear. Upper Abdomen: No acute abnormality. Multiple hepatic cysts are unchanged from 2016. No definite new hepatic abnormalities noted. Musculoskeletal: No acute bony abnormalities or suspicious focal bony lesions. Review of the MIP images confirms the above findings. IMPRESSION: 1. Masslike consolidation with probable atelectasis of majority of the LEFT UPPER lobe highly suspicious for malignancy with some degree of atelectasis. Ill-defined soft tissue/adenopathy throughout the LEFT mediastinum, LEFT hilum and subcarinal regions. Associated small LEFT pleural effusion. Electronically Signed   By: Margarette Canada M.D.   On: 07/01/2017 17:04    Cardiac Studies     Patient Profile     77 yo female with PMH of HTN, HL, GERD, asthma, and MVP who presented with syncopal episode after taking NG and chest pain.     Assessment & Plan    1. Chest pain - long history of atypical chest pain symptoms. Prior negative stress test several years ago - admitted with chest pain, has been progressing in frequency and severity over the last several weeks - mild trop 0.16 peak, trending down. Echo LVE 55-60%, no WMAs. EKG without specific ischemic changes - repeat episode of chest pain early this AM, resolved with SL NG  - somewhat mixed symptoms. Concerning symptoms have been progressing in severity and frequency, repeat episode this AM better with NG - we will  plan for cath tomorrow to defintively evaluate her coronaries. Difficult situation,patient does have newly detected lung mass being worked up by pulmonary, if stenting indicated consider BMS or Syndegy DES. If low risk anatomy consider medical therapy. - start hep gtt. Start atorva 40mg  daily.   2. Lung mass - possible PET and bronch with bx per pulmonary in the near future   For questions or updates, please contact [facility] Please consult [email or web address] for contact info under Cardiology/STEMI.      Merrily Pew, MD  07/03/2017, 9:28 AM

## 2017-07-03 NOTE — Progress Notes (Signed)
PROGRESS NOTE    Amanda Jackson  [id number] DOB: [birth year] DOA: 07/01/2017 PCP: Midge Minium, MD   Brief Narrative: Amanda Jackson is a 77 y.o. female with medical history significant of hypertension, vocal fold paralysis/hoarseness, allergic rhinitis, anxiety, GERD. She presented with chest pain and syncope. ACS workup significant for elevated troponin. Cardiology consulted. Incidentally, she was found to have a lung mass concerning for malignancy. Pulmonology consulted and will follow-up as an outpatient.   Assessment & Plan:   Principal Problem:   Chest pain Active Problems:   Essential hypertension   Anxiety   GERD (gastroesophageal reflux disease)   Syncope   Hoarseness of voice   Lung mass   Atypical chest pain Heart score of 5. Chest pain recurrent. Possibly GI related as patient has history of GERD. EKG unremarkable. Troponin elevated and trended down. Echocardiogram without significant structural/functional abnormalities.  -Cardiology recommendations: Stress test 07/04/17  Mass-like consolidation Concerning for cancer. Patient with mildly elevated WBC with chills. Could possibly be an abscess but clinical suspicion is very low. Cough is chronic and non-productive. WBC resolved without antibiotics. -Pulmonology consulted: outpatient follow-up and will see 07/04/17 if still inpatient  Syncope Likely vasovagal vs secondary to nitroglycerin. Loss of consciousness without head injury.   Essential hypertension Well controlled -Continue amlodipine, losartan, spironolactone  Anxiety Stable -Continue Ativan BID  GERD Patient takes omeprazole as an outpatient -Protonix BID  Persistent cough Allergies Follows with allergist. -Continue Singulair, Pulmicort, Flonase  Hoarseness Patient with diagnosis of vocal fold paralysis. Sees ENT as an outpatient.    DVT prophylaxis: Lovenox Code Status:   Code Status: Full Code Family Communication: None at  bedside Disposition Plan: Discharge pending cardiology   Consultants:   Cardiology  Pulmonology  Procedures:   Transthoracic Echocardiogram (07/02/2017) Study Conclusions  - Left ventricle: The cavity size was normal. Wall thickness was   normal. Systolic function was normal. The estimated ejection   fraction was in the range of 55% to 60%. Wall motion was normal;   there were no regional wall motion abnormalities. Doppler   parameters are consistent with abnormal left ventricular   relaxation (grade 1 diastolic dysfunction). - Pulmonary arteries: Systolic pressure was mildly increased. PA   peak pressure: 32 mm Hg (S).  Antimicrobials:  None    Subjective: Episode of chest pain this morning relieved with nitroglycerin. Has otherwise felt well.  Objective: Vitals:   07/03/17 0456 07/03/17 0531 07/03/17 0537 07/03/17 0958  BP: (!) 151/82 134/86 111/70   Pulse: 81     Resp: 18     Temp: 97.9 F (36.6 C)     TempSrc: Oral     SpO2: 93%   96%  Weight: 52.8 kg (116 lb 4.8 oz)     Height:        Intake/Output Summary (Last 24 hours) at 07/03/2017 1311 Last data filed at 07/02/2017 1814 Gross per 24 hour  Intake 480 ml  Output -  Net 480 ml   Filed Weights   07/01/17 1604 07/01/17 1949 07/03/17 0456  Weight: 52.6 kg (116 lb) 53.4 kg (117 lb 11.2 oz) 52.8 kg (116 lb 4.8 oz)    Examination:  General exam: Appears calm and comfortable Respiratory system: Clear to auscultation. Respiratory effort normal. Cardiovascular system: Regular rate and rhythm. Normal S1 and S2. No heart murmurs present. No extra heart sounds Gastrointestinal system: Abdomen is nondistended, soft and nontender. No organomegaly or masses felt. Normal bowel sounds heard. Central nervous system: Alert  and oriented. No focal neurological deficits. Extremities: No edema. No calf tenderness Skin: No cyanosis. No rashes Psychiatry: Judgement and insight appear normal. Mood & affect appropriate.      Data Reviewed: I have personally reviewed following labs and imaging studies  CBC: Recent Labs  Lab 07/01/17 1432 07/02/17 0657  WBC 12.3* 9.8  HGB 12.7 12.7  HCT 39.2 38.5  MCV 93.1 94.1  PLT 313 983   Basic Metabolic Panel: Recent Labs  Lab 07/01/17 1432 07/02/17 0657  NA 136 137  K 3.9 3.7  CL 107 109  CO2 20* 18*  GLUCOSE 104* 113*  BUN 13 12  CREATININE 0.68 0.62  CALCIUM 9.4 9.2   GFR: Estimated Creatinine Clearance: 45.1 mL/min (by C-G formula based on SCr of 0.62 mg/dL). Liver Function Tests: No results for input(s): AST, ALT, ALKPHOS, BILITOT, PROT, ALBUMIN in the last 168 hours. No results for input(s): LIPASE, AMYLASE in the last 168 hours. No results for input(s): AMMONIA in the last 168 hours. Coagulation Profile: No results for input(s): INR, PROTIME in the last 168 hours. Cardiac Enzymes: Recent Labs  Lab 07/02/17 0209 07/02/17 0657 07/03/17 0745  TROPONINI 0.15* 0.12* 0.07*   BNP (last 3 results) No results for input(s): PROBNP in the last 8760 hours. HbA1C: Recent Labs    07/01/17 1951  HGBA1C 5.4   CBG: No results for input(s): GLUCAP in the last 168 hours. Lipid Profile: No results for input(s): CHOL, HDL, LDLCALC, TRIG, CHOLHDL, LDLDIRECT in the last 72 hours. Thyroid Function Tests: Recent Labs    07/02/17 0705  TSH 1.863   Anemia Panel: No results for input(s): VITAMINB12, FOLATE, FERRITIN, TIBC, IRON, RETICCTPCT in the last 72 hours. Sepsis Labs: Recent Labs  Lab 07/01/17 1951  PROCALCITON 0.50    No results found for this or any previous visit (from the past 240 hour(s)).       Radiology Studies: Dg Chest 2 View  Result Date: 07/01/2017 CLINICAL DATA:  Left-sided chest pain EXAM: CHEST - 2 VIEW COMPARISON:  11/15/2014 FINDINGS: Cardiac shadow is within normal limits. The aorta is unremarkable. Right lung is clear. Left lung demonstrates fullness in the left suprahilar region extending into the upper lobe. No  other focal infiltrate is seen. No sizable effusion is noted. IMPRESSION: Left suprahilar density with upper lobe consolidation. CT of the chest with contrast is recommended to rule out underlying centrally obstructing lesion. Electronically Signed   By: Inez Catalina M.D.   On: 07/01/2017 15:22   Ct Angio Chest Pe W And/or Wo Contrast  Result Date: 07/01/2017 CLINICAL DATA:  77 year old female with acute chest pain and shortness of breath for 1 day. EXAM: CT ANGIOGRAPHY CHEST WITH CONTRAST TECHNIQUE: Multidetector CT imaging of the chest was performed using the standard protocol during bolus administration of intravenous contrast. Multiplanar CT image reconstructions and MIPs were obtained to evaluate the vascular anatomy. CONTRAST:  160mL ISOVUE-370 IOPAMIDOL (ISOVUE-370) INJECTION 76% COMPARISON:  07/01/2017 radiographs, 11/21/2014 abdominal CT and other studies. FINDINGS: Cardiovascular: This is a technically satisfactory study. No pulmonary emboli are identified. There is no evidence of thoracic aortic aneurysm. UPPER limits normal heart size noted with small pericardial effusion. Mediastinum/Nodes: Ill-defined soft tissue/adenopathy throughout the LEFT mediastinum, LEFT hilum and subcarinal region noted. Visualized thyroid gland is unremarkable. Lungs/Pleura: Masslike consolidation throughout the majority of the LEFT UPPER lobe noted and highly suspicious for central malignancy with likely some degree of atelectasis. The entire mass-like consolidation and atelectasis measures 7 x 8.6 x  11.2 cm. Narrowing of the LEFT UPPER/central pulmonary arteries noted. A small LEFT pleural effusion is noted. The RIGHT lung is clear. Upper Abdomen: No acute abnormality. Multiple hepatic cysts are unchanged from 2016. No definite new hepatic abnormalities noted. Musculoskeletal: No acute bony abnormalities or suspicious focal bony lesions. Review of the MIP images confirms the above findings. IMPRESSION: 1. Masslike  consolidation with probable atelectasis of majority of the LEFT UPPER lobe highly suspicious for malignancy with some degree of atelectasis. Ill-defined soft tissue/adenopathy throughout the LEFT mediastinum, LEFT hilum and subcarinal regions. Associated small LEFT pleural effusion. Electronically Signed   By: Margarette Canada M.D.   On: 07/01/2017 17:04        Scheduled Meds: . amLODipine  5 mg Oral Daily  . [START ON 07/04/2017] aspirin  81 mg Oral Pre-Cath  . aspirin EC  81 mg Oral Daily  . atorvastatin  40 mg Oral q1800  . budesonide  0.5 mg Nebulization BID  . carvedilol  12.5 mg Oral BID WC  . folic acid  1 mg Oral Daily  . LORazepam  1 mg Oral BID  . losartan  50 mg Oral Daily  . montelukast  10 mg Oral QHS  . pantoprazole  40 mg Oral Daily  . potassium chloride SA  20 mEq Oral Daily  . sodium chloride flush  3 mL Intravenous Q12H  . spironolactone  25 mg Oral Daily  . vitamin B-12  2,500 mcg Oral Daily   Continuous Infusions: . sodium chloride    . [START ON 07/04/2017] sodium chloride     Followed by  . [START ON 07/04/2017] sodium chloride    . heparin 600 Units/hr (07/03/17 1127)     LOS: 0 days     Cordelia Poche, MD [facility] 07/03/2017, 1:11 PM Pager: [phone]  If 7PM-7AM, please contact night-coverage [email or web address] Password [email or web address] 07/03/2017, 1:11 PM

## 2017-07-04 ENCOUNTER — Ambulatory Visit: Payer: Medicare Other | Admitting: Allergy and Immunology

## 2017-07-04 ENCOUNTER — Encounter (HOSPITAL_COMMUNITY)
Admission: EM | Disposition: A | Discharge status: Home / Self Care | Payer: Self-pay | Source: Home / Self Care | Attending: Family Medicine

## 2017-07-04 DIAGNOSIS — R918 Other nonspecific abnormal finding of lung field: Secondary | ICD-10-CM | POA: Diagnosis not present

## 2017-07-04 DIAGNOSIS — F419 Anxiety disorder, unspecified: Secondary | ICD-10-CM | POA: Diagnosis not present

## 2017-07-04 DIAGNOSIS — I214 Non-ST elevation (NSTEMI) myocardial infarction: Secondary | ICD-10-CM

## 2017-07-04 DIAGNOSIS — I25110 Atherosclerotic heart disease of native coronary artery with unstable angina pectoris: Secondary | ICD-10-CM

## 2017-07-04 DIAGNOSIS — R079 Chest pain, unspecified: Secondary | ICD-10-CM | POA: Diagnosis not present

## 2017-07-04 DIAGNOSIS — I10 Essential (primary) hypertension: Secondary | ICD-10-CM | POA: Diagnosis not present

## 2017-07-04 HISTORY — PX: LEFT HEART CATH AND CORONARY ANGIOGRAPHY: CATH118249

## 2017-07-04 LAB — CBC
HCT: 39.4 % (ref 36.0–46.0)
Hemoglobin: 12.9 g/dL (ref 12.0–15.0)
MCH: 30.7 pg (ref 26.0–34.0)
MCHC: 32.7 g/dL (ref 30.0–36.0)
MCV: 93.8 fL (ref 78.0–100.0)
PLATELETS: 302 10*3/uL (ref 150–400)
RBC: 4.2 MIL/uL (ref 3.87–5.11)
RDW: 13.8 % (ref 11.5–15.5)
WBC: 15.9 10*3/uL — ABNORMAL HIGH (ref 4.0–10.5)

## 2017-07-04 LAB — BASIC METABOLIC PANEL
Anion gap: 9 (ref 5–15)
BUN: 7 mg/dL (ref 6–20)
CALCIUM: 9.5 mg/dL (ref 8.9–10.3)
CO2: 23 mmol/L (ref 22–32)
CREATININE: 0.65 mg/dL (ref 0.44–1.00)
Chloride: 104 mmol/L (ref 101–111)
GFR calc non Af Amer: >60 mL/min (ref >60)
Glucose, Bld: 109 mg/dL — ABNORMAL HIGH (ref 65–99)
Potassium: 3.6 mmol/L (ref 3.5–5.1)
Sodium: 136 mmol/L (ref 135–145)

## 2017-07-04 LAB — PROTIME-INR
INR: 1.07
PROTHROMBIN TIME: 13.9 s (ref 11.4–15.2)

## 2017-07-04 LAB — HEPARIN LEVEL (UNFRACTIONATED)
HEPARIN UNFRACTIONATED: 0.28 [IU]/mL — AB (ref 0.30–0.70)
Heparin Unfractionated: 0.16 IU/mL — ABNORMAL LOW (ref 0.30–0.70)

## 2017-07-04 SURGERY — LEFT HEART CATH AND CORONARY ANGIOGRAPHY
Anesthesia: LOCAL

## 2017-07-04 MED ORDER — SODIUM CHLORIDE 0.9% FLUSH
3.0000 mL | Freq: Two times a day (BID) | INTRAVENOUS | Status: DC
  Administered 2017-07-05 – 2017-07-07 (×6): 3 mL via INTRAVENOUS

## 2017-07-04 MED ORDER — VERAPAMIL HCL 2.5 MG/ML IV SOLN
INTRAVENOUS | Status: AC
  Filled 2017-07-04: qty 2

## 2017-07-04 MED ORDER — SODIUM CHLORIDE 0.9% FLUSH: 3.0000 mL | INTRAVENOUS | Status: DC | PRN

## 2017-07-04 MED ORDER — IOPAMIDOL (ISOVUE-370) INJECTION 76%
INTRAVENOUS | Status: AC
  Filled 2017-07-04: qty 100

## 2017-07-04 MED ORDER — MIDAZOLAM HCL 2 MG/2ML IJ SOLN
INTRAMUSCULAR | Status: DC | PRN
  Administered 2017-07-04: 0.5 mg via INTRAVENOUS

## 2017-07-04 MED ORDER — SODIUM CHLORIDE 0.9 % IV SOLN: 250.0000 mL | INTRAVENOUS | Status: DC | PRN

## 2017-07-04 MED ORDER — LORAZEPAM 2 MG/ML IJ SOLN: 0.5000 mg | Freq: Once | INTRAMUSCULAR | Status: DC

## 2017-07-04 MED ORDER — HEPARIN (PORCINE) IN NACL 2-0.9 UNIT/ML-% IJ SOLN
INTRAMUSCULAR | Status: AC
  Filled 2017-07-04: qty 1000

## 2017-07-04 MED ORDER — IOPAMIDOL (ISOVUE-370) INJECTION 76%
INTRAVENOUS | Status: DC | PRN
Start: 2017-07-04 — End: 2017-07-04
  Administered 2017-07-04: 55 mL via INTRA_ARTERIAL

## 2017-07-04 MED ORDER — SODIUM CHLORIDE 0.9 % IV SOLN: INTRAVENOUS | Status: AC

## 2017-07-04 MED ORDER — VERAPAMIL HCL 2.5 MG/ML IV SOLN
INTRAVENOUS | Status: DC | PRN
  Administered 2017-07-04: 10 mL via INTRA_ARTERIAL

## 2017-07-04 MED ORDER — HEPARIN BOLUS VIA INFUSION
2000.0000 [IU] | Freq: Once | INTRAVENOUS | Status: AC
  Administered 2017-07-04: 2000 [IU] via INTRAVENOUS
  Filled 2017-07-04: qty 2000

## 2017-07-04 MED ORDER — LIDOCAINE HCL (PF) 1 % IJ SOLN
INTRAMUSCULAR | Status: AC
  Filled 2017-07-04: qty 30

## 2017-07-04 MED ORDER — HEPARIN SODIUM (PORCINE) 1000 UNIT/ML IJ SOLN
INTRAMUSCULAR | Status: DC | PRN
  Administered 2017-07-04: 3000 [IU] via INTRAVENOUS

## 2017-07-04 MED ORDER — HEPARIN (PORCINE) IN NACL 2-0.9 UNIT/ML-% IJ SOLN
INTRAMUSCULAR | Status: AC | PRN
  Administered 2017-07-04: 500 mL

## 2017-07-04 MED ORDER — LIDOCAINE HCL (PF) 1 % IJ SOLN
INTRAMUSCULAR | Status: DC | PRN
Start: 2017-07-04 — End: 2017-07-04
  Administered 2017-07-04: 3 mL

## 2017-07-04 MED ORDER — MIDAZOLAM HCL 2 MG/2ML IJ SOLN
INTRAMUSCULAR | Status: AC
  Filled 2017-07-04: qty 2

## 2017-07-04 MED ORDER — HEPARIN SODIUM (PORCINE) 1000 UNIT/ML IJ SOLN
INTRAMUSCULAR | Status: AC
  Filled 2017-07-04: qty 1

## 2017-07-04 SURGICAL SUPPLY — 10 items
BAND CMPR LRG ZPHR (HEMOSTASIS) ×1
BAND ZEPHYR COMPRESS 30 LONG (HEMOSTASIS) ×1 IMPLANT
CATH OPTITORQUE TIG 4.0 5F (CATHETERS) ×1 IMPLANT
GLIDESHEATH SLEND A-KIT 6F 22G (SHEATH) ×1 IMPLANT
GUIDEWIRE INQWIRE 1.5J.035X260 (WIRE) IMPLANT
INQWIRE 1.5J .035X260CM (WIRE) ×2
KIT HEART LEFT (KITS) ×2 IMPLANT
PACK CARDIAC CATHETERIZATION (CUSTOM PROCEDURE TRAY) ×2 IMPLANT
TRANSDUCER W/STOPCOCK (MISCELLANEOUS) ×2 IMPLANT
TUBING CIL FLEX 10 FLL-RA (TUBING) ×2 IMPLANT

## 2017-07-04 NOTE — Interval H&P Note (Signed)
History and Physical Interval Note:  07/04/2017 3:31 PM  Amanda Jackson  has presented today for surgery, with the diagnosis of NSTEMI  The various methods of treatment have been discussed with the patient and family. After consideration of risks, benefits and other options for treatment, the patient has consented to  Procedure(s): LEFT HEART CATH AND CORONARY ANGIOGRAPHY (N/A) as a surgical intervention .  The patient's history has been reviewed, patient examined, no change in status, stable for surgery.  I have reviewed the patient's chart and labs.  Questions were answered to the patient's satisfaction.    Cath Lab Visit (complete for each Cath Lab visit)  Clinical Evaluation Leading to the Procedure:   ACS: Yes.    Non-ACS:    Anginal Classification: CCS III  Anti-ischemic medical therapy: Maximal Therapy (2 or more classes of medications)  Non-Invasive Test Results: No non-invasive testing performed  Prior CABG: No previous CABG   Glenetta Hew

## 2017-07-04 NOTE — Progress Notes (Addendum)
Progress Note  Patient Name: Amanda Jackson Date of Encounter: 07/04/2017  Primary Cardiologist: [facility]   Subjective   Pt doing well today. No recurrence of chest pain since yesterday 07/03/17. Awaiting cardiac cath today. She has concerns regarding upcoming procedures (lung mass and vocal cord dilation) if PCI and antiplatelet therapy indicated.   Inpatient Medications    Scheduled Meds: . amLODipine  5 mg Oral Daily  . aspirin EC  81 mg Oral Daily  . atorvastatin  40 mg Oral q1800  . budesonide  0.5 mg Nebulization BID  . carvedilol  12.5 mg Oral BID WC  . folic acid  1 mg Oral Daily  . LORazepam  1 mg Oral BID  . losartan  50 mg Oral Daily  . montelukast  10 mg Oral QHS  . pantoprazole  40 mg Oral Daily  . potassium chloride SA  20 mEq Oral Daily  . sodium chloride flush  3 mL Intravenous Q12H  . spironolactone  25 mg Oral Daily  . vitamin B-12  2,500 mcg Oral Daily   Continuous Infusions: . sodium chloride    . sodium chloride 1 mL/kg/hr (07/04/17 0631)  . heparin 900 Units/hr (07/04/17 0629)   PRN Meds: sodium chloride, acetaminophen, albuterol, ondansetron (ZOFRAN) IV, sodium chloride flush, traMADol, zolpidem   Vital Signs    Vitals:   07/03/17 2031 07/04/17 0532 07/04/17 0632 07/04/17 0735  BP: 128/83 (!) 159/95 (!) 135/98 (!) 142/86  Pulse: 92 93    Resp: 18 18    Temp: 98.4 F (36.9 C) 97.9 F (36.6 C)    TempSrc: Oral Oral    SpO2: 97% 97%    Weight:  115 lb 8 oz (52.4 kg)    Height:        Intake/Output Summary (Last 24 hours) at 07/04/2017 0839 Last data filed at 07/04/2017 0811 Gross per 24 hour  Intake 401.43 ml  Output -  Net 401.43 ml   Filed Weights   07/01/17 1949 07/03/17 0456 07/04/17 0532  Weight: 117 lb 11.2 oz (53.4 kg) 116 lb 4.8 oz (52.8 kg) 115 lb 8 oz (52.4 kg)    Physical Exam   General: Frail, elderly, NAD Skin: Warm, dry, intact  Head: Normocephalic, atraumatic,  clear, moist mucus membranes. Neck: Negative  for carotid bruits. No JVD Lungs: Wheezes present bilaterally, diminished lung sounds left, No rales or rhonchi. Breathing is unlabored. Cardiovascular: RRR with S1 S2. No murmurs, rubs, or gallops Abdomen: Soft, non-tender, non-distended with normoactive bowel sounds.  No obvious abdominal masses. MSK: Strength and tone appear normal for age. 5/5 in all extremities Extremities: No edema. No clubbing or cyanosis. DP/PT pulses 2+ bilaterally Neuro: Alert and oriented. No focal deficits. No facial asymmetry. MAE spontaneously. Psych: Responds to questions appropriately with normal affect.    Labs    Chemistry Recent Labs  Lab 07/02/17 0657 07/03/17 1437 07/04/17 0405  NA 137 132* 136  K 3.7 3.9 3.6  CL 109 103 104  CO2 18* 21* 23  GLUCOSE 113* 105* 109*  BUN 12 6 7   CREATININE 0.62 0.93 0.65  CALCIUM 9.2 9.2 9.5  GFRNONAA >60 58* >60  GFRAA >60 >60 >60  ANIONGAP 10 8 9      Hematology Recent Labs  Lab 07/01/17 1432 07/02/17 0657 07/04/17 0405  WBC 12.3* 9.8 15.9*  RBC 4.21 4.09 4.20  HGB 12.7 12.7 12.9  HCT 39.2 38.5 39.4  MCV 93.1 94.1 93.8  MCH 30.2 31.1  30.7  MCHC 32.4 33.0 32.7  RDW 13.5 14.0 13.8  PLT 313 299 302    Cardiac Enzymes Recent Labs  Lab 07/02/17 0657 07/03/17 0745 07/03/17 1437 07/03/17 1814  TROPONINI 0.12* 0.07* 0.05* 0.06*    Recent Labs  Lab 07/01/17 1455  TROPIPOC 0.16*     BNPNo results for input(s): BNP, PROBNP in the last 168 hours.   DDimer No results for input(s): DDIMER in the last 168 hours.   Radiology    No results found.  Telemetry    07/04/17 NSR HR 92 - Personally Reviewed  ECG     07/03/17 NSR HR 90. No evidence of acute ST-T wave changes - Personally Reviewed  Cardiac Studies   Echo 07/02/17: Study Conclusions  - Left ventricle: The cavity size was normal. Wall thickness was   normal. Systolic function was normal. The estimated ejection   fraction was in the range of 55% to 60%. Wall motion was  normal;   there were no regional wall motion abnormalities. Doppler   parameters are consistent with abnormal left ventricular   relaxation (grade 1 diastolic dysfunction). - Pulmonary arteries: Systolic pressure was mildly increased. PA   peak pressure: 32 mm Hg (S Left ventricle:  The cavity size was normal. Wall thickness was normal. Systolic function was normal. The estimated ejection fraction was in the range of 55% to 60%. Wall motion was normal; there were no regional wall motion abnormalities. Doppler parameters are consistent with abnormal left ventricular relaxation (grade 1 diastolic dysfunction).  Patient Profile     77 y.o. female with hx of HTN, HL, GERD, asthma, and MVP who presented with syncopal episode and chest pain.   Assessment & Plan    1. Chest pain with mildly elevated trop: -Trop, 0.07>0.05>0.06 -EKG with no acute ST-T wave changes 07/03/17 -Denies chest pain this AM or overnight -Plan for cath today given chest pain symptoms. Last CP episode yesterday AM, relieved with SL NTG -Newly detected lung mass, if PCI indicated>>consider BMS or if low risk, consider medical therpay -ASA 81mg , carvedilol 12.5mg , statin, Hep gtt  2. Hx of tobacco use with newly detected lung mass concerning for malignancy (suprahilar density): -Per pulmonary  -Possible PET with bronchoscopy this admission  -Will need to be conscious of this if PCI indicated   3. HTN: -Stable, 142/86>135/98>159/95 -Amlodipine 10mg  , carvedilol 12.5mg    4. Known hx of Anxiety: -Stable -Scheduled Ativan 1 mg PO BID     Signed, Kathyrn Drown NP-C [facility] Pager: [phone] 07/04/2017, 8:39 AM      I have seen and examined the patient along with Kathyrn Drown NP-C.  I have reviewed the chart, notes and new data.  I agree with NP's note.  Key new complaints: Denies any angina since yesterday.  Does become short of breath even when repositioning herself in bed Key examination  changes: Bitonal voice, reduced breath sounds left upper lobe, normal cardiovascular exam, slender Key new findings / data: Chest CT strongly suggestive of left upper lobe mass with involvement of the hilum.  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  PLAN: Possible unstable angina with mildly abnormal cardiac troponin I in a patient with acute coronary risk factors (remote smoking quit 40 years ago, treated hypertension, but does not have diabetes mellitus and has an excellent lipid profile) and a left upper lobe mass that is highly suspicious for primary lung malignancy with involvement of the hilum and left recurrent laryngeal nerve.  For diagnostic  coronary angiography today.  This procedure has been fully reviewed with the patient and written informed consent has been obtained.  Discussed the relative pros and cons of bare metal stents versus drug-eluting stents.  Due to the need for further diagnostic workup, if she has a short coronary lesion and a large caliber vessel she would best suited for a bare-metal stent.  However, if the vessel is small in caliber or the lesion is longer requiring long or multiple stents, drug-eluting stent placement may still be the best option.  Will discuss with our interventional cardiology partners.  Sanda Klein, MD, [place] [phone] 07/04/2017, 11:09 AM For questions or updates, please contact   Please consult [email or web address] for contact info under Cardiology/STEMI.

## 2017-07-04 NOTE — H&P (View-Only) (Signed)
PULMONARY / CRITICAL CARE MEDICINE   Name: Amanda Jackson MRN: [id number] DOB: 1940-08-18    ADMISSION DATE:  07/01/2017 CONSULTATION DATE:  07/02/2017  REFERRING MD:  Dr. Lonny Prude, [facility]  CHIEF COMPLAINT:  Chest pain  HISTORY OF PRESENT ILLNESS:   77 yo female former smoker presented with chest pain.  As part of her evaluation she had chest xray that showed a left suprahilar density.  She then had CT chest that showed a mass like consolidation in Lt upper lobe and mediastinal and Lt hilar adenopathy.  She is noted to have dysphonia since November 2018.  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  She was advised to have a CT neck to further assess, but wasn't able to get this scheduled.  She hasn't had a recent chest xray before this admission.  She is followed by allergist for asthma.  She has dry cough.  She denies hemoptysis.  Her weight has been steady.  She gets sweats at night.  She is from [place].  She quit smoking years ago.  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  Denies history of PNA or TB.  No animal/bird exposures.    PAST MEDICAL HISTORY :  She  has a past medical history of Allergy, Anxiety, Arthritis, Asthma, Atypical chest pain, Colon polyp, GERD (gastroesophageal reflux disease), colonoscopy (03/18/09), Hyperlipidemia, Hypertension, Left nasal polyps, MVP (mitral valve prolapse), Seasonal allergies, and Tubulovillous adenoma.  PAST SURGICAL HISTORY: She  has a past surgical history that includes Laparoscopic Right Colectomy (01/25/03); Knee arthroscopy w/ lateral release (11/17/99); Knee arthroscopy w/ partial medial meniscectomy (11/17/99); Functional endoscopic sinus surgery (08/28/08); Abdominal hysterectomy; Breast surgery (2010); Tonsillectomy (1952); Polypectomy; Colonoscopy; Bunionectomy; and nasal polyp removal.  Allergies  Allergen Reactions  . Tiotropium Swelling    Facial and lip swelling,     No current facility-administered medications on  file prior to encounter.    Current Outpatient Medications on File Prior to Encounter  Medication Sig  . acetaminophen (TYLENOL ARTHRITIS PAIN) 650 MG CR tablet Take 650 mg by mouth every 8 (eight) hours as needed for pain.  Marland Kitchen albuterol (PROVENTIL HFA;VENTOLIN HFA) 108 (90 Base) MCG/ACT inhaler INHALE TWO PUFFS EVERY 4 HOURS AS NEEDED FOR COUGH OR WHEEZE.  Marland Kitchen amLODipine (NORVASC) 5 MG tablet Take 1 tablet (5 mg total) by mouth daily.  Marland Kitchen aspirin 81 MG EC tablet Take 81 mg by mouth daily.    . budesonide (PULMICORT) 0.5 MG/2ML nebulizer solution Mix budesonide/saline nasal irrigation twice a day. (Patient taking differently: 0.5 mg See admin instructions. Mix budesonide (0.5 mg) with powdered OTC saline sinus rinse and approx 8 oz sterile water, squirt into both nostrils twice a day.)  . Calcium Carb-Cholecalciferol (CALCIUM 600+D3 PO) Take 600 mg by mouth 2 (two) times daily.   . carvedilol (COREG) 12.5 MG tablet TAKE ONE TABLET BY MOUTH TWICE DAILY WITH A MEAL (Patient taking differently: TAKE ONE TABLET (12.5 MG) BY MOUTH TWICE DAILY WITH A MEAL)  . Cyanocobalamin (VITAMIN B-12) 2500 MCG SUBL Place 2,500 mcg under the tongue daily.   . fluticasone (FLOVENT HFA) 220 MCG/ACT inhaler Inhale 2 puffs 2 (two) times daily into the lungs. (Patient taking differently: Inhale 3 puffs into the lungs 2 (two) times daily. )  . FOLBIC 2.5-25-2 MG TABS tablet TAKE 1 TABLET BY MOUTH EVERY DAY  . ibuprofen (ADVIL,MOTRIN) 800 MG tablet TAKE 1 TABLET (800 MG TOTAL) BY MOUTH 3 (THREE) TIMES DAILY  AS NEEDED FOR PAIN  . KLOR-CON M20 20 MEQ tablet TAKE 1 TABLET BY MOUTH EVERY DAY (Patient taking differently: TAKE 1 TABLET (20 MEQ)  BY MOUTH EVERY DAY)  . LORazepam (ATIVAN) 1 MG tablet TAKE 1 TABLET BY MOUTH TWICE A DAY (Patient taking differently: TAKE 1 TABLET (1 MG) BY MOUTH TWICE A DAY)  . losartan (COZAAR) 50 MG tablet TAKE 1 TABLET BY MOUTH EVERY DAY (Patient taking differently: TAKE 1 TABLET (50 MG)  BY MOUTH EVERY  DAY)  . montelukast (SINGULAIR) 10 MG tablet TAKE 1 TABLET BY MOUTH EVERY DAY (Patient taking differently: TAKE 1 TABLET (10 MG) BY MOUTH EVERY DAY AT BEDTIME)  . Multiple Vitamin (MULTIVITAMIN WITH MINERALS) TABS tablet Take 1 tablet by mouth daily. Centrum  . nitroGLYCERIN (NITROSTAT) 0.4 MG SL tablet Place 1 tablet (0.4 mg total) under the tongue every 5 (five) minutes as needed. For chest pain (patient unsure of MAX doses allowed) (Patient taking differently: Place 0.4 mg under the tongue every 5 (five) minutes as needed for chest pain. )  . omeprazole (PRILOSEC) 20 MG capsule TAKE 1 CAPSULE (20 MG TOTAL) BY MOUTH 2 (TWO) TIMES DAILY. (Patient taking differently: Take 20 mg by mouth 2 (two) times daily as needed (chest pain/acid reflux). )  . spironolactone (ALDACTONE) 25 MG tablet TAKE 1 TABLET (25 MG TOTAL) BY MOUTH DAILY. PLEASE KEEP 01/11/17 APPT FOR FURHTER REFILLS (Patient taking differently: Take 25 mg by mouth daily. )  . traMADol (ULTRAM) 50 MG tablet Take 50 mg by mouth daily as needed (knee pain). Reported on 07/23/2015  . triamcinolone cream (KENALOG) 0.1 % 1 APPLICATION TO AFFECTED AREA TWICE A DAY AS NEEDED FOR RED SPOTS ON BACK  . Beclomethasone Dipropionate (QNASL) 80 MCG/ACT AERS ONE SPRAY EACH NOSTRIL TWICE DAILY FOR STUFFY NOSE OR DRAINAGE. (Patient not taking: Reported on 07/01/2017)    FAMILY HISTORY:  Her indicated that her mother is deceased. She indicated that her father is deceased. She indicated that her sister is alive. She indicated that only one of her two sons is alive. She indicated that the status of her neg hx is unknown.   SOCIAL HISTORY: She  reports that she quit smoking about 39 years ago. she has never used smokeless tobacco. She reports that she drinks alcohol. She reports that she does not use drugs.  REVIEW OF SYSTEMS:   12 point ROS negative except above.  SUBJECTIVE:  Underwent cardiac cath today > no CAD, no intervention required.   VITAL SIGNS: BP  136/78   Pulse 95   Temp (!) 97.3 F (36.3 C) (Oral)   Resp 15   Ht 5\' 1"  (1.549 m)   Wt 52.4 kg (115 lb 8 oz)   SpO2 97%   BMI 21.82 kg/m   INTAKE / OUTPUT: I/O last 3 completed shifts: In: 298.4 [I.V.:298.4] Out: -   PHYSICAL EXAMINATION:  General - pleasant Eyes - pupils reactive ENT - no sinus tenderness, no oral exudate, no LAN, hoarse voice Cardiac - regular, no murmur Chest - decreased BS Lt upper lung field Abd - soft, non tender Ext - no edema Skin - no rashes Neuro - normal strength Psych - normal mood   LABS:  BMET Recent Labs  Lab 07/02/17 0657 07/03/17 1437 07/04/17 0405  NA 137 132* 136  K 3.7 3.9 3.6  CL 109 103 104  CO2 18* 21* 23  BUN 12 6 7   CREATININE 0.62 0.93 0.65  GLUCOSE 113* 105* 109*  Electrolytes Recent Labs  Lab 07/02/17 0657 07/03/17 1437 07/04/17 0405  CALCIUM 9.2 9.2 9.5    CBC Recent Labs  Lab 07/01/17 1432 07/02/17 0657 07/04/17 0405  WBC 12.3* 9.8 15.9*  HGB 12.7 12.7 12.9  HCT 39.2 38.5 39.4  PLT 313 299 302    Coag's Recent Labs  Lab 07/04/17 0405  INR 1.07    Sepsis Markers Recent Labs  Lab 07/01/17 1951  PROCALCITON 0.50    ABG No results for input(s): PHART, PCO2ART, PO2ART in the last 168 hours.  Liver Enzymes No results for input(s): AST, ALT, ALKPHOS, BILITOT, ALBUMIN in the last 168 hours.  Cardiac Enzymes Recent Labs  Lab 07/03/17 0745 07/03/17 1437 07/03/17 1814  TROPONINI 0.07* 0.05* 0.06*    Glucose No results for input(s): GLUCAP in the last 168 hours.  Imaging No results found.  DISCUSSION: 77 yo female with dysphonia from Lt vocal fold paralysis admitted with chest pain and found to have Lt upper lung mass with mediastinal and hilar adenopathy.  This is most concerning for primary lung cancer.    ASSESSMENT / PLAN:  Chest pain. Suspect due to her mass, atelectasis  Left upper lung mass with hilar and mediastinal adenopathy. She has a LUL cutoff sign.  Should be able to get a tissue dx via FOB with endobronchial bx's. Scheduled for 3/12. Explained to the patient. She agrees to proceed. I'll stop her heparin gtt now. Hold her ASA 81mg  in prep for bx's 3/12.   Baltazar Apo, MD, PhD 07/04/2017, 4:46 PM [facility] [phone] or if no answer [phone]

## 2017-07-04 NOTE — H&P (View-Only) (Signed)
Progress Note  Patient Name: Amanda Jackson Date of Encounter: 07/04/2017  Primary Cardiologist: [facility]   Subjective   Pt doing well today. No recurrence of chest pain since yesterday 07/03/17. Awaiting cardiac cath today. She has concerns regarding upcoming procedures (lung mass and vocal cord dilation) if PCI and antiplatelet therapy indicated.   Inpatient Medications    Scheduled Meds: . amLODipine  5 mg Oral Daily  . aspirin EC  81 mg Oral Daily  . atorvastatin  40 mg Oral q1800  . budesonide  0.5 mg Nebulization BID  . carvedilol  12.5 mg Oral BID WC  . folic acid  1 mg Oral Daily  . LORazepam  1 mg Oral BID  . losartan  50 mg Oral Daily  . montelukast  10 mg Oral QHS  . pantoprazole  40 mg Oral Daily  . potassium chloride SA  20 mEq Oral Daily  . sodium chloride flush  3 mL Intravenous Q12H  . spironolactone  25 mg Oral Daily  . vitamin B-12  2,500 mcg Oral Daily   Continuous Infusions: . sodium chloride    . sodium chloride 1 mL/kg/hr (07/04/17 0631)  . heparin 900 Units/hr (07/04/17 0629)   PRN Meds: sodium chloride, acetaminophen, albuterol, ondansetron (ZOFRAN) IV, sodium chloride flush, traMADol, zolpidem   Vital Signs    Vitals:   07/03/17 2031 07/04/17 0532 07/04/17 0632 07/04/17 0735  BP: 128/83 (!) 159/95 (!) 135/98 (!) 142/86  Pulse: 92 93    Resp: 18 18    Temp: 98.4 F (36.9 C) 97.9 F (36.6 C)    TempSrc: Oral Oral    SpO2: 97% 97%    Weight:  115 lb 8 oz (52.4 kg)    Height:        Intake/Output Summary (Last 24 hours) at 07/04/2017 0839 Last data filed at 07/04/2017 0811 Gross per 24 hour  Intake 401.43 ml  Output -  Net 401.43 ml   Filed Weights   07/01/17 1949 07/03/17 0456 07/04/17 0532  Weight: 117 lb 11.2 oz (53.4 kg) 116 lb 4.8 oz (52.8 kg) 115 lb 8 oz (52.4 kg)    Physical Exam   General: Frail, elderly, NAD Skin: Warm, dry, intact  Head: Normocephalic, atraumatic,  clear, moist mucus membranes. Neck: Negative  for carotid bruits. No JVD Lungs: Wheezes present bilaterally, diminished lung sounds left, No rales or rhonchi. Breathing is unlabored. Cardiovascular: RRR with S1 S2. No murmurs, rubs, or gallops Abdomen: Soft, non-tender, non-distended with normoactive bowel sounds.  No obvious abdominal masses. MSK: Strength and tone appear normal for age. 5/5 in all extremities Extremities: No edema. No clubbing or cyanosis. DP/PT pulses 2+ bilaterally Neuro: Alert and oriented. No focal deficits. No facial asymmetry. MAE spontaneously. Psych: Responds to questions appropriately with normal affect.    Labs    Chemistry Recent Labs  Lab 07/02/17 0657 07/03/17 1437 07/04/17 0405  NA 137 132* 136  K 3.7 3.9 3.6  CL 109 103 104  CO2 18* 21* 23  GLUCOSE 113* 105* 109*  BUN 12 6 7   CREATININE 0.62 0.93 0.65  CALCIUM 9.2 9.2 9.5  GFRNONAA >60 58* >60  GFRAA >60 >60 >60  ANIONGAP 10 8 9      Hematology Recent Labs  Lab 07/01/17 1432 07/02/17 0657 07/04/17 0405  WBC 12.3* 9.8 15.9*  RBC 4.21 4.09 4.20  HGB 12.7 12.7 12.9  HCT 39.2 38.5 39.4  MCV 93.1 94.1 93.8  MCH 30.2 31.1  30.7  MCHC 32.4 33.0 32.7  RDW 13.5 14.0 13.8  PLT 313 299 302    Cardiac Enzymes Recent Labs  Lab 07/02/17 0657 07/03/17 0745 07/03/17 1437 07/03/17 1814  TROPONINI 0.12* 0.07* 0.05* 0.06*    Recent Labs  Lab 07/01/17 1455  TROPIPOC 0.16*     BNPNo results for input(s): BNP, PROBNP in the last 168 hours.   DDimer No results for input(s): DDIMER in the last 168 hours.   Radiology    No results found.  Telemetry    07/04/17 NSR HR 92 - Personally Reviewed  ECG     07/03/17 NSR HR 90. No evidence of acute ST-T wave changes - Personally Reviewed  Cardiac Studies   Echo 07/02/17: Study Conclusions  - Left ventricle: The cavity size was normal. Wall thickness was   normal. Systolic function was normal. The estimated ejection   fraction was in the range of 55% to 60%. Wall motion was  normal;   there were no regional wall motion abnormalities. Doppler   parameters are consistent with abnormal left ventricular   relaxation (grade 1 diastolic dysfunction). - Pulmonary arteries: Systolic pressure was mildly increased. PA   peak pressure: 32 mm Hg (S Left ventricle:  The cavity size was normal. Wall thickness was normal. Systolic function was normal. The estimated ejection fraction was in the range of 55% to 60%. Wall motion was normal; there were no regional wall motion abnormalities. Doppler parameters are consistent with abnormal left ventricular relaxation (grade 1 diastolic dysfunction).  Patient Profile     77 y.o. female with hx of HTN, HL, GERD, asthma, and MVP who presented with syncopal episode and chest pain.   Assessment & Plan    1. Chest pain with mildly elevated trop: -Trop, 0.07>0.05>0.06 -EKG with no acute ST-T wave changes 07/03/17 -Denies chest pain this AM or overnight -Plan for cath today given chest pain symptoms. Last CP episode yesterday AM, relieved with SL NTG -Newly detected lung mass, if PCI indicated>>consider BMS or if low risk, consider medical therpay -ASA 81mg , carvedilol 12.5mg , statin, Hep gtt  2. Hx of tobacco use with newly detected lung mass concerning for malignancy (suprahilar density): -Per pulmonary  -Possible PET with bronchoscopy this admission  -Will need to be conscious of this if PCI indicated   3. HTN: -Stable, 142/86>135/98>159/95 -Amlodipine 10mg  , carvedilol 12.5mg    4. Known hx of Anxiety: -Stable -Scheduled Ativan 1 mg PO BID     Signed, Kathyrn Drown NP-C [facility] Pager: [phone] 07/04/2017, 8:39 AM      I have seen and examined the patient along with Kathyrn Drown NP-C.  I have reviewed the chart, notes and new data.  I agree with NP's note.  Key new complaints: Denies any angina since yesterday.  Does become short of breath even when repositioning herself in bed Key examination  changes: Bitonal voice, reduced breath sounds left upper lobe, normal cardiovascular exam, slender Key new findings / data: Chest CT strongly suggestive of left upper lobe mass with involvement of the hilum.  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  PLAN: Possible unstable angina with mildly abnormal cardiac troponin I in a patient with acute coronary risk factors (remote smoking quit 40 years ago, treated hypertension, but does not have diabetes mellitus and has an excellent lipid profile) and a left upper lobe mass that is highly suspicious for primary lung malignancy with involvement of the hilum and left recurrent laryngeal nerve.  For diagnostic  coronary angiography today.  This procedure has been fully reviewed with the patient and written informed consent has been obtained.  Discussed the relative pros and cons of bare metal stents versus drug-eluting stents.  Due to the need for further diagnostic workup, if she has a short coronary lesion and a large caliber vessel she would best suited for a bare-metal stent.  However, if the vessel is small in caliber or the lesion is longer requiring long or multiple stents, drug-eluting stent placement may still be the best option.  Will discuss with our interventional cardiology partners.  Sanda Klein, MD, [place] [phone] 07/04/2017, 11:09 AM For questions or updates, please contact   Please consult [email or web address] for contact info under Cardiology/STEMI.

## 2017-07-04 NOTE — Progress Notes (Addendum)
PROGRESS NOTE    Amanda Jackson  [id number] DOB: 07-27-1940 DOA: 07/01/2017 PCP: Midge Minium, MD   Brief Narrative: Amanda Jackson is a 77 y.o. female with medical history significant of hypertension, vocal fold paralysis/hoarseness, allergic rhinitis, anxiety, GERD. She presented with chest pain and syncope. ACS workup significant for elevated troponin. Cardiology consulted. Incidentally, she was found to have a lung mass concerning for malignancy. Pulmonology consulted and will follow-up as an outpatient.   Assessment & Plan:   Principal Problem:   Chest pain Active Problems:   Essential hypertension   Anxiety   GERD (gastroesophageal reflux disease)   Syncope   Hoarseness of voice   Lung mass   Atypical chest pain Heart score of 5. Chest pain recurrent. Possibly GI related as patient has history of GERD. EKG unremarkable. Troponin elevated and trended down. Echocardiogram without significant structural/functional abnormalities.  -Cardiology recommendations: Cath 3/11  Mass-like consolidation Concerning for cancer. Patient with mildly elevated WBC with chills. Could possibly be an abscess but clinical suspicion is very low. Cough is chronic and non-productive. WBC resolved without antibiotics. -Pulmonology consulted: outpatient follow-up and will see 07/04/17 if still inpatient  Syncope Likely vasovagal vs secondary to nitroglycerin. Loss of consciousness without head injury.  Essential hypertension Well controlled -Continue amlodipine, losartan, spironolactone  Anxiety Stable -Continue Ativan BID  GERD Patient takes omeprazole as an outpatient -Protonix BID  Persistent cough Allergies Follows with allergist. -Continue Singulair, Pulmicort, Flonase  Hoarseness Patient with diagnosis of vocal fold paralysis. Sees ENT as an outpatient.  Leukocytosis Elevated again today. No source of infection other than lung mass. Patient is stable otherwise,  so would doubt abscess. Afebrile.    DVT prophylaxis: Heparin gtt Code Status:   Code Status: Full Code Family Communication: None at bedside Disposition Plan: Discharge pending cardiology   Consultants:   Cardiology  Pulmonology  Procedures:   Transthoracic Echocardiogram (07/02/2017) Study Conclusions  - Left ventricle: The cavity size was normal. Wall thickness was   normal. Systolic function was normal. The estimated ejection   fraction was in the range of 55% to 60%. Wall motion was normal;   there were no regional wall motion abnormalities. Doppler   parameters are consistent with abnormal left ventricular   relaxation (grade 1 diastolic dysfunction). - Pulmonary arteries: Systolic pressure was mildly increased. PA   peak pressure: 32 mm Hg (S).  Antimicrobials:  None    Subjective: Chest pain earlier today with palpitations. She thinks her episodes are secondary to anxiety surrounding her cath procedure. No sneezing, rhinorrhea, nausea, vomiting, abdominal pain, diarrhea or constipation.  Objective: Vitals:   07/04/17 0532 07/04/17 0632 07/04/17 0735 07/04/17 0849  BP: (!) 159/95 (!) 135/98 (!) 142/86   Pulse: 93     Resp: 18     Temp: 97.9 F (36.6 C)     TempSrc: Oral     SpO2: 97%   99%  Weight: 52.4 kg (115 lb 8 oz)     Height:        Intake/Output Summary (Last 24 hours) at 07/04/2017 0857 Last data filed at 07/04/2017 0811 Gross per 24 hour  Intake 401.43 ml  Output -  Net 401.43 ml   Filed Weights   07/01/17 1949 07/03/17 0456 07/04/17 0532  Weight: 53.4 kg (117 lb 11.2 oz) 52.8 kg (116 lb 4.8 oz) 52.4 kg (115 lb 8 oz)    Examination:  General exam: Appears calm and comfortable Respiratory: Clear to auscultation bilaterally. Unlabored work  of breathing. No wheezing or rales. Cardiovascular system: Regular rate and rhythm. Normal S1 and S2. No heart murmurs present. No extra heart sounds Gastrointestinal system: Abdomen is nondistended,  soft and nontender. No organomegaly or masses felt. Normal bowel sounds heard. Central nervous system: Alert and oriented. No focal neurological deficits. Extremities: No edema. No calf tenderness Skin: No cyanosis. No rashes Psychiatry: Judgement and insight appear normal. Mood & affect appropriate.     Data Reviewed: I have personally reviewed following labs and imaging studies  CBC: Recent Labs  Lab 07/01/17 1432 07/02/17 0657 07/04/17 0405  WBC 12.3* 9.8 15.9*  HGB 12.7 12.7 12.9  HCT 39.2 38.5 39.4  MCV 93.1 94.1 93.8  PLT 313 299 263   Basic Metabolic Panel: Recent Labs  Lab 07/01/17 1432 07/02/17 0657 07/03/17 1437 07/04/17 0405  NA 136 137 132* 136  K 3.9 3.7 3.9 3.6  CL 107 109 103 104  CO2 20* 18* 21* 23  GLUCOSE 104* 113* 105* 109*  BUN 13 12 6 7   CREATININE 0.68 0.62 0.93 0.65  CALCIUM 9.4 9.2 9.2 9.5   GFR: Estimated Creatinine Clearance: 45.1 mL/min (by C-G formula based on SCr of 0.65 mg/dL). Liver Function Tests: No results for input(s): AST, ALT, ALKPHOS, BILITOT, PROT, ALBUMIN in the last 168 hours. No results for input(s): LIPASE, AMYLASE in the last 168 hours. No results for input(s): AMMONIA in the last 168 hours. Coagulation Profile: Recent Labs  Lab 07/04/17 0405  INR 1.07   Cardiac Enzymes: Recent Labs  Lab 07/02/17 0209 07/02/17 0657 07/03/17 0745 07/03/17 1437 07/03/17 1814  TROPONINI 0.15* 0.12* 0.07* 0.05* 0.06*   BNP (last 3 results) No results for input(s): PROBNP in the last 8760 hours. HbA1C: Recent Labs    07/01/17 1951  HGBA1C 5.4   CBG: No results for input(s): GLUCAP in the last 168 hours. Lipid Profile: No results for input(s): CHOL, HDL, LDLCALC, TRIG, CHOLHDL, LDLDIRECT in the last 72 hours. Thyroid Function Tests: Recent Labs    07/02/17 0705  TSH 1.863   Anemia Panel: No results for input(s): VITAMINB12, FOLATE, FERRITIN, TIBC, IRON, RETICCTPCT in the last 72 hours. Sepsis Labs: Recent Labs  Lab  07/01/17 1951  PROCALCITON 0.50    No results found for this or any previous visit (from the past 240 hour(s)).       Radiology Studies: No results found.      Scheduled Meds: . amLODipine  5 mg Oral Daily  . aspirin EC  81 mg Oral Daily  . atorvastatin  40 mg Oral q1800  . budesonide  0.5 mg Nebulization BID  . carvedilol  12.5 mg Oral BID WC  . folic acid  1 mg Oral Daily  . LORazepam  1 mg Oral BID  . losartan  50 mg Oral Daily  . montelukast  10 mg Oral QHS  . pantoprazole  40 mg Oral Daily  . potassium chloride SA  20 mEq Oral Daily  . sodium chloride flush  3 mL Intravenous Q12H  . spironolactone  25 mg Oral Daily  . vitamin B-12  2,500 mcg Oral Daily   Continuous Infusions: . sodium chloride    . sodium chloride 1 mL/kg/hr (07/04/17 0631)  . heparin 900 Units/hr (07/04/17 0629)     LOS: 0 days     Cordelia Poche, MD [facility] 07/04/2017, 8:57 AM Pager: [phone]  If 7PM-7AM, please contact night-coverage [email or web address] Password [facility] 07/04/2017, 8:57 AM

## 2017-07-04 NOTE — Progress Notes (Addendum)
PULMONARY / CRITICAL CARE MEDICINE   Name: Amanda Jackson MRN: [id number] DOB: March 04, 1941    ADMISSION DATE:  07/01/2017 CONSULTATION DATE:  07/02/2017  REFERRING MD:  Dr. Lonny Prude, [facility]  CHIEF COMPLAINT:  Chest pain  HISTORY OF PRESENT ILLNESS:   77 yo female former smoker presented with chest pain.  As part of her evaluation she had chest xray that showed a left suprahilar density.  She then had CT chest that showed a mass like consolidation in Lt upper lobe and mediastinal and Lt hilar adenopathy.  She is noted to have dysphonia since November 2018.  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  She was advised to have a CT neck to further assess, but wasn't able to get this scheduled.  She hasn't had a recent chest xray before this admission.  She is followed by allergist for asthma.  She has dry cough.  She denies hemoptysis.  Her weight has been steady.  She gets sweats at night.  She is from [place].  She quit smoking years ago.  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  Denies history of PNA or TB.  No animal/bird exposures.    PAST MEDICAL HISTORY :  She  has a past medical history of Allergy, Anxiety, Arthritis, Asthma, Atypical chest pain, Colon polyp, GERD (gastroesophageal reflux disease), colonoscopy (03/18/09), Hyperlipidemia, Hypertension, Left nasal polyps, MVP (mitral valve prolapse), Seasonal allergies, and Tubulovillous adenoma.  PAST SURGICAL HISTORY: She  has a past surgical history that includes Laparoscopic Right Colectomy (01/25/03); Knee arthroscopy w/ lateral release (11/17/99); Knee arthroscopy w/ partial medial meniscectomy (11/17/99); Functional endoscopic sinus surgery (08/28/08); Abdominal hysterectomy; Breast surgery (2010); Tonsillectomy (1952); Polypectomy; Colonoscopy; Bunionectomy; and nasal polyp removal.  Allergies  Allergen Reactions  . Tiotropium Swelling    Facial and lip swelling,     No current facility-administered medications on  file prior to encounter.    Current Outpatient Medications on File Prior to Encounter  Medication Sig  . acetaminophen (TYLENOL ARTHRITIS PAIN) 650 MG CR tablet Take 650 mg by mouth every 8 (eight) hours as needed for pain.  Marland Kitchen albuterol (PROVENTIL HFA;VENTOLIN HFA) 108 (90 Base) MCG/ACT inhaler INHALE TWO PUFFS EVERY 4 HOURS AS NEEDED FOR COUGH OR WHEEZE.  Marland Kitchen amLODipine (NORVASC) 5 MG tablet Take 1 tablet (5 mg total) by mouth daily.  Marland Kitchen aspirin 81 MG EC tablet Take 81 mg by mouth daily.    . budesonide (PULMICORT) 0.5 MG/2ML nebulizer solution Mix budesonide/saline nasal irrigation twice a day. (Patient taking differently: 0.5 mg See admin instructions. Mix budesonide (0.5 mg) with powdered OTC saline sinus rinse and approx 8 oz sterile water, squirt into both nostrils twice a day.)  . Calcium Carb-Cholecalciferol (CALCIUM 600+D3 PO) Take 600 mg by mouth 2 (two) times daily.   . carvedilol (COREG) 12.5 MG tablet TAKE ONE TABLET BY MOUTH TWICE DAILY WITH A MEAL (Patient taking differently: TAKE ONE TABLET (12.5 MG) BY MOUTH TWICE DAILY WITH A MEAL)  . Cyanocobalamin (VITAMIN B-12) 2500 MCG SUBL Place 2,500 mcg under the tongue daily.   . fluticasone (FLOVENT HFA) 220 MCG/ACT inhaler Inhale 2 puffs 2 (two) times daily into the lungs. (Patient taking differently: Inhale 3 puffs into the lungs 2 (two) times daily. )  . FOLBIC 2.5-25-2 MG TABS tablet TAKE 1 TABLET BY MOUTH EVERY DAY  . ibuprofen (ADVIL,MOTRIN) 800 MG tablet TAKE 1 TABLET (800 MG TOTAL) BY MOUTH 3 (THREE) TIMES DAILY  AS NEEDED FOR PAIN  . KLOR-CON M20 20 MEQ tablet TAKE 1 TABLET BY MOUTH EVERY DAY (Patient taking differently: TAKE 1 TABLET (20 MEQ)  BY MOUTH EVERY DAY)  . LORazepam (ATIVAN) 1 MG tablet TAKE 1 TABLET BY MOUTH TWICE A DAY (Patient taking differently: TAKE 1 TABLET (1 MG) BY MOUTH TWICE A DAY)  . losartan (COZAAR) 50 MG tablet TAKE 1 TABLET BY MOUTH EVERY DAY (Patient taking differently: TAKE 1 TABLET (50 MG)  BY MOUTH EVERY  DAY)  . montelukast (SINGULAIR) 10 MG tablet TAKE 1 TABLET BY MOUTH EVERY DAY (Patient taking differently: TAKE 1 TABLET (10 MG) BY MOUTH EVERY DAY AT BEDTIME)  . Multiple Vitamin (MULTIVITAMIN WITH MINERALS) TABS tablet Take 1 tablet by mouth daily. Centrum  . nitroGLYCERIN (NITROSTAT) 0.4 MG SL tablet Place 1 tablet (0.4 mg total) under the tongue every 5 (five) minutes as needed. For chest pain (patient unsure of MAX doses allowed) (Patient taking differently: Place 0.4 mg under the tongue every 5 (five) minutes as needed for chest pain. )  . omeprazole (PRILOSEC) 20 MG capsule TAKE 1 CAPSULE (20 MG TOTAL) BY MOUTH 2 (TWO) TIMES DAILY. (Patient taking differently: Take 20 mg by mouth 2 (two) times daily as needed (chest pain/acid reflux). )  . spironolactone (ALDACTONE) 25 MG tablet TAKE 1 TABLET (25 MG TOTAL) BY MOUTH DAILY. PLEASE KEEP 01/11/17 APPT FOR FURHTER REFILLS (Patient taking differently: Take 25 mg by mouth daily. )  . traMADol (ULTRAM) 50 MG tablet Take 50 mg by mouth daily as needed (knee pain). Reported on 07/23/2015  . triamcinolone cream (KENALOG) 0.1 % 1 APPLICATION TO AFFECTED AREA TWICE A DAY AS NEEDED FOR RED SPOTS ON BACK  . Beclomethasone Dipropionate (QNASL) 80 MCG/ACT AERS ONE SPRAY EACH NOSTRIL TWICE DAILY FOR STUFFY NOSE OR DRAINAGE. (Patient not taking: Reported on 07/01/2017)    FAMILY HISTORY:  Her indicated that her mother is deceased. She indicated that her father is deceased. She indicated that her sister is alive. She indicated that only one of her two sons is alive. She indicated that the status of her neg hx is unknown.   SOCIAL HISTORY: She  reports that she quit smoking about 39 years ago. she has never used smokeless tobacco. She reports that she drinks alcohol. She reports that she does not use drugs.  REVIEW OF SYSTEMS:   12 point ROS negative except above.  SUBJECTIVE:  Underwent cardiac cath today > no CAD, no intervention required.   VITAL SIGNS: BP  136/78   Pulse 95   Temp (!) 97.3 F (36.3 C) (Oral)   Resp 15   Ht 5\' 1"  (1.549 m)   Wt 52.4 kg (115 lb 8 oz)   SpO2 97%   BMI 21.82 kg/m   INTAKE / OUTPUT: I/O last 3 completed shifts: In: 298.4 [I.V.:298.4] Out: -   PHYSICAL EXAMINATION:  General - pleasant Eyes - pupils reactive ENT - no sinus tenderness, no oral exudate, no LAN, hoarse voice Cardiac - regular, no murmur Chest - decreased BS Lt upper lung field Abd - soft, non tender Ext - no edema Skin - no rashes Neuro - normal strength Psych - normal mood   LABS:  BMET Recent Labs  Lab 07/02/17 0657 07/03/17 1437 07/04/17 0405  NA 137 132* 136  K 3.7 3.9 3.6  CL 109 103 104  CO2 18* 21* 23  BUN 12 6 7   CREATININE 0.62 0.93 0.65  GLUCOSE 113* 105* 109*  Electrolytes Recent Labs  Lab 07/02/17 0657 07/03/17 1437 07/04/17 0405  CALCIUM 9.2 9.2 9.5    CBC Recent Labs  Lab 07/01/17 1432 07/02/17 0657 07/04/17 0405  WBC 12.3* 9.8 15.9*  HGB 12.7 12.7 12.9  HCT 39.2 38.5 39.4  PLT 313 299 302    Coag's Recent Labs  Lab 07/04/17 0405  INR 1.07    Sepsis Markers Recent Labs  Lab 07/01/17 1951  PROCALCITON 0.50    ABG No results for input(s): PHART, PCO2ART, PO2ART in the last 168 hours.  Liver Enzymes No results for input(s): AST, ALT, ALKPHOS, BILITOT, ALBUMIN in the last 168 hours.  Cardiac Enzymes Recent Labs  Lab 07/03/17 0745 07/03/17 1437 07/03/17 1814  TROPONINI 0.07* 0.05* 0.06*    Glucose No results for input(s): GLUCAP in the last 168 hours.  Imaging No results found.  DISCUSSION: 77 yo female with dysphonia from Lt vocal fold paralysis admitted with chest pain and found to have Lt upper lung mass with mediastinal and hilar adenopathy.  This is most concerning for primary lung cancer.    ASSESSMENT / PLAN:  Chest pain. Suspect due to her mass, atelectasis  Left upper lung mass with hilar and mediastinal adenopathy. She has a LUL cutoff sign.  Should be able to get a tissue dx via FOB with endobronchial bx's. Scheduled for 3/12. Explained to the patient. She agrees to proceed. I'll stop her heparin gtt now. Hold her ASA 81mg  in prep for bx's 3/12.   Baltazar Apo, MD, PhD 07/04/2017, 4:46 PM [facility] [phone] or if no answer [phone]

## 2017-07-04 NOTE — Progress Notes (Signed)
ANTICOAGULATION CONSULT NOTE - Follow Up Consult  Pharmacy Consult for heparin Indication: chest pain/ACS  Labs: Recent Labs    07/01/17 1432  07/02/17 0657 07/03/17 0745 07/03/17 1437 07/03/17 1814 07/04/17 0405  HGB 12.7  --  12.7  --   --   --  12.9  HCT 39.2  --  38.5  --   --   --  39.4  PLT 313  --  299  --   --   --  302  LABPROT  --   --   --   --   --   --  13.9  INR  --   --   --   --   --   --  1.07  HEPARINUNFRC  --   --   --   --   --  <0.10* 0.16*  CREATININE 0.68  --  0.62  --  0.93  --  0.65  TROPONINI  --    < > 0.12* 0.07* 0.05* 0.06*  --    < > = values in this interval not displayed.    Assessment: 77yo female remains subtherapeutic on heparin after rate increase.  Goal of Therapy:  Heparin level 0.3-0.7 units/ml   Plan:  Will rebolus with heparin 2000 units and increase heparin gtt by 3 units/kg/hr to 900 units/hr and check level in 8 hours.    Wynona Neat, PharmD, BCPS  07/04/2017,6:26 AM

## 2017-07-05 ENCOUNTER — Encounter (HOSPITAL_COMMUNITY): Payer: Self-pay | Admitting: Cardiology

## 2017-07-05 ENCOUNTER — Inpatient Hospital Stay (HOSPITAL_COMMUNITY): Payer: Medicare Other

## 2017-07-05 ENCOUNTER — Observation Stay (HOSPITAL_COMMUNITY): Payer: Medicare Other

## 2017-07-05 ENCOUNTER — Encounter (HOSPITAL_COMMUNITY)
Admission: EM | Disposition: A | Discharge status: Home / Self Care | Payer: Self-pay | Source: Home / Self Care | Attending: Family Medicine

## 2017-07-05 DIAGNOSIS — R918 Other nonspecific abnormal finding of lung field: Secondary | ICD-10-CM | POA: Diagnosis not present

## 2017-07-05 DIAGNOSIS — I771 Stricture of artery: Secondary | ICD-10-CM | POA: Diagnosis present

## 2017-07-05 DIAGNOSIS — Z86010 Personal history of colonic polyps: Secondary | ICD-10-CM | POA: Diagnosis not present

## 2017-07-05 DIAGNOSIS — R072 Precordial pain: Secondary | ICD-10-CM

## 2017-07-05 DIAGNOSIS — R55 Syncope and collapse: Secondary | ICD-10-CM

## 2017-07-05 DIAGNOSIS — R079 Chest pain, unspecified: Secondary | ICD-10-CM | POA: Diagnosis not present

## 2017-07-05 DIAGNOSIS — Z800 Family history of malignant neoplasm of digestive organs: Secondary | ICD-10-CM | POA: Diagnosis not present

## 2017-07-05 DIAGNOSIS — Z87891 Personal history of nicotine dependence: Secondary | ICD-10-CM | POA: Diagnosis not present

## 2017-07-05 DIAGNOSIS — Z801 Family history of malignant neoplasm of trachea, bronchus and lung: Secondary | ICD-10-CM | POA: Diagnosis not present

## 2017-07-05 DIAGNOSIS — Z66 Do not resuscitate: Secondary | ICD-10-CM | POA: Diagnosis present

## 2017-07-05 DIAGNOSIS — I214 Non-ST elevation (NSTEMI) myocardial infarction: Secondary | ICD-10-CM | POA: Diagnosis present

## 2017-07-05 DIAGNOSIS — E785 Hyperlipidemia, unspecified: Secondary | ICD-10-CM | POA: Diagnosis present

## 2017-07-05 DIAGNOSIS — Z90710 Acquired absence of both cervix and uterus: Secondary | ICD-10-CM | POA: Diagnosis not present

## 2017-07-05 DIAGNOSIS — I341 Nonrheumatic mitral (valve) prolapse: Secondary | ICD-10-CM | POA: Diagnosis present

## 2017-07-05 DIAGNOSIS — K219 Gastro-esophageal reflux disease without esophagitis: Secondary | ICD-10-CM | POA: Diagnosis present

## 2017-07-05 DIAGNOSIS — Z8249 Family history of ischemic heart disease and other diseases of the circulatory system: Secondary | ICD-10-CM | POA: Diagnosis not present

## 2017-07-05 DIAGNOSIS — F419 Anxiety disorder, unspecified: Secondary | ICD-10-CM | POA: Diagnosis present

## 2017-07-05 DIAGNOSIS — C3412 Malignant neoplasm of upper lobe, left bronchus or lung: Secondary | ICD-10-CM | POA: Diagnosis present

## 2017-07-05 DIAGNOSIS — J9811 Atelectasis: Secondary | ICD-10-CM | POA: Diagnosis present

## 2017-07-05 DIAGNOSIS — I10 Essential (primary) hypertension: Secondary | ICD-10-CM

## 2017-07-05 DIAGNOSIS — I2510 Atherosclerotic heart disease of native coronary artery without angina pectoris: Secondary | ICD-10-CM | POA: Diagnosis present

## 2017-07-05 DIAGNOSIS — J3801 Paralysis of vocal cords and larynx, unilateral: Secondary | ICD-10-CM | POA: Diagnosis present

## 2017-07-05 DIAGNOSIS — J45909 Unspecified asthma, uncomplicated: Secondary | ICD-10-CM | POA: Diagnosis present

## 2017-07-05 HISTORY — PX: VIDEO BRONCHOSCOPY: SHX5072

## 2017-07-05 LAB — BASIC METABOLIC PANEL
Anion gap: 11 (ref 5–15)
BUN: 8 mg/dL (ref 6–20)
CO2: 19 mmol/L — AB (ref 22–32)
CREATININE: 0.72 mg/dL (ref 0.44–1.00)
Calcium: 9.8 mg/dL (ref 8.9–10.3)
Chloride: 105 mmol/L (ref 101–111)
GFR calc non Af Amer: >60 mL/min (ref >60)
Glucose, Bld: 113 mg/dL — ABNORMAL HIGH (ref 65–99)
Potassium: 3.8 mmol/L (ref 3.5–5.1)
Sodium: 135 mmol/L (ref 135–145)

## 2017-07-05 LAB — CBC
HCT: 39.9 % (ref 36.0–46.0)
Hemoglobin: 13.4 g/dL (ref 12.0–15.0)
MCH: 31.5 pg (ref 26.0–34.0)
MCHC: 33.6 g/dL (ref 30.0–36.0)
MCV: 93.9 fL (ref 78.0–100.0)
PLATELETS: 315 10*3/uL (ref 150–400)
RBC: 4.25 MIL/uL (ref 3.87–5.11)
RDW: 13.5 % (ref 11.5–15.5)
WBC: 15.8 10*3/uL — ABNORMAL HIGH (ref 4.0–10.5)

## 2017-07-05 SURGERY — VIDEO BRONCHOSCOPY WITHOUT FLUORO
Anesthesia: Moderate Sedation | Laterality: Bilateral

## 2017-07-05 MED ORDER — LIDOCAINE HCL (PF) 1 % IJ SOLN
INTRAMUSCULAR | Status: DC | PRN
  Administered 2017-07-05: 6 mL

## 2017-07-05 MED ORDER — PHENYLEPHRINE HCL 0.25 % NA SOLN: 1.0000 | Freq: Four times a day (QID) | NASAL | Status: DC | PRN

## 2017-07-05 MED ORDER — MIDAZOLAM HCL 5 MG/ML IJ SOLN
INTRAMUSCULAR | Status: AC
  Filled 2017-07-05: qty 2

## 2017-07-05 MED ORDER — LIDOCAINE HCL 2 % EX GEL
CUTANEOUS | Status: DC | PRN
  Administered 2017-07-05: 1

## 2017-07-05 MED ORDER — FENTANYL CITRATE (PF) 100 MCG/2ML IJ SOLN
INTRAMUSCULAR | Status: DC | PRN
  Administered 2017-07-05: 50 ug via INTRAVENOUS
  Administered 2017-07-05: 25 ug via INTRAVENOUS
  Administered 2017-07-05 (×2): 50 ug via INTRAVENOUS

## 2017-07-05 MED ORDER — LIDOCAINE HCL 2 % EX GEL: 1.0000 "application " | Freq: Once | CUTANEOUS | Status: DC

## 2017-07-05 MED ORDER — PHENYLEPHRINE HCL 0.25 % NA SOLN
NASAL | Status: DC | PRN
  Administered 2017-07-05: 2 via NASAL

## 2017-07-05 MED ORDER — SODIUM CHLORIDE 0.9 % IV SOLN
Freq: Once | INTRAVENOUS | Status: AC
  Administered 2017-07-05: 13:00:00 via INTRAVENOUS

## 2017-07-05 MED ORDER — FENTANYL CITRATE (PF) 100 MCG/2ML IJ SOLN
INTRAMUSCULAR | Status: AC
  Filled 2017-07-05: qty 4

## 2017-07-05 MED ORDER — MIDAZOLAM HCL 10 MG/2ML IJ SOLN
INTRAMUSCULAR | Status: DC | PRN
  Administered 2017-07-05: 2 mg via INTRAVENOUS
  Administered 2017-07-05: 1 mg via INTRAVENOUS
  Administered 2017-07-05: 2 mg via INTRAVENOUS
  Administered 2017-07-05: 1 mg via INTRAVENOUS

## 2017-07-05 NOTE — Progress Notes (Signed)
Video Bronchscopy done Intervention Bronchial needle aspiration Intervention Bronchial biopsy Intervention Bronchial brushing Procedure tolerated well

## 2017-07-05 NOTE — Progress Notes (Addendum)
Progress Note  Patient Name: Amanda Jackson Date of Encounter: 07/05/2017  Primary Cardiologist: New- Dr. Harl Bowie  Subjective   Pt doing well this AM. Denies chest pain overnight. She is more concerned with her respiratory status and upcoming procedure today, 07/05/17.   Inpatient Medications    Scheduled Meds: . amLODipine  5 mg Oral Daily  . atorvastatin  40 mg Oral q1800  . budesonide  0.5 mg Nebulization BID  . carvedilol  12.5 mg Oral BID WC  . folic acid  1 mg Oral Daily  . LORazepam  0.5 mg Intravenous Once  . LORazepam  1 mg Oral BID  . losartan  50 mg Oral Daily  . montelukast  10 mg Oral QHS  . pantoprazole  40 mg Oral Daily  . potassium chloride SA  20 mEq Oral Daily  . sodium chloride flush  3 mL Intravenous Q12H  . spironolactone  25 mg Oral Daily  . vitamin B-12  2,500 mcg Oral Daily   Continuous Infusions: . sodium chloride     PRN Meds: sodium chloride, acetaminophen, albuterol, ondansetron (ZOFRAN) IV, sodium chloride flush, traMADol, zolpidem   Vital Signs    Vitals:   07/04/17 1755 07/04/17 2011 07/04/17 2022 07/05/17 0333  BP: (!) 147/85  (!) 144/77   Pulse:   95   Resp:   17 18  Temp:   97.7 F (36.5 C) 98.1 F (36.7 C)  TempSrc:   Oral Oral  SpO2: 94% 95% 97% 98%  Weight:    115 lb 6.4 oz (52.3 kg)  Height:        Intake/Output Summary (Last 24 hours) at 07/05/2017 6269 Last data filed at 07/05/2017 0300 Gross per 24 hour  Intake 567.5 ml  Output -  Net 567.5 ml   Filed Weights   07/03/17 0456 07/04/17 0532 07/05/17 0333  Weight: 116 lb 4.8 oz (52.8 kg) 115 lb 8 oz (52.4 kg) 115 lb 6.4 oz (52.3 kg)    Physical Exam   General: Well developed, well nourished, NAD Skin: Warm, dry, intact  Head: Normocephalic, atraumatic, clear, moist mucus membranes. Neck: Negative for carotid bruits. No JVD Lungs:Positive wheezing and rhonchi bilaterally, diminished lung sounds. Breathing is unlabored. Cardiovascular: RRR with S1 S2. No  murmurs, rubs, or gallops Abdomen: Soft, non-tender, non-distended with normoactive bowel sounds. No obvious abdominal masses. MSK: Strength and tone appear normal for age. 5/5 in all extremities Extremities: No edema. No clubbing or cyanosis. DP/PT pulses 2+ bilaterally Neuro: Alert and oriented. No focal deficits. No facial asymmetry. MAE spontaneously. Psych: Responds to questions appropriately with normal affect.    Labs    Chemistry Recent Labs  Lab 07/03/17 1437 07/04/17 0405 07/05/17 0346  NA 132* 136 135  K 3.9 3.6 3.8  CL 103 104 105  CO2 21* 23 19*  GLUCOSE 105* 109* 113*  BUN 6 7 8   CREATININE 0.93 0.65 0.72  CALCIUM 9.2 9.5 9.8  GFRNONAA 58* >60 >60  GFRAA >60 >60 >60  ANIONGAP 8 9 11      Hematology Recent Labs  Lab 07/02/17 0657 07/04/17 0405 07/05/17 0346  WBC 9.8 15.9* 15.8*  RBC 4.09 4.20 4.25  HGB 12.7 12.9 13.4  HCT 38.5 39.4 39.9  MCV 94.1 93.8 93.9  MCH 31.1 30.7 31.5  MCHC 33.0 32.7 33.6  RDW 14.0 13.8 13.5  PLT 299 302 315    Cardiac Enzymes Recent Labs  Lab 07/02/17 0657 07/03/17 0745 07/03/17 1437 07/03/17  1814  TROPONINI 0.12* 0.07* 0.05* 0.06*    Recent Labs  Lab 07/01/17 1455  TROPIPOC 0.16*     BNPNo results for input(s): BNP, PROBNP in the last 168 hours.   DDimer No results for input(s): DDIMER in the last 168 hours.   Radiology    No results found.  Telemetry    07/05/2017 NSR/ ST  HR 98- Personally Reviewed  ECG    No new tracings as of 07/05/2017- Personally Reviewed  Cardiac Studies   Cath 07/04/17:   Prox LAD to Mid LAD lesion is 45% stenosed. With otherwise normal coronary arteries.  LV end diastolic pressure is moderately elevated.  Normal LV function by Echocardiogram   Angiographically moderate disease in the mid LAD but otherwise no significant disease.   Very tortuous coronary arteries.  Mild to moderately elevated LVEDP. -->  Suggestive of Systemic Hypertension/Hypertensive Heart Disease.     Echo 07/02/17: Study Conclusions  - Left ventricle: The cavity size was normal. Wall thickness was normal. Systolic function was normal. The estimated ejection fraction was in the range of 55% to 60%. Wall motion was normal; there were no regional wall motion abnormalities. Doppler parameters are consistent with abnormal left ventricular relaxation (grade 1 diastolic dysfunction). - Pulmonary arteries: Systolic pressure was mildly increased. PA peak pressure: 32 mm Hg (S Left ventricle: The cavity size was normal. Wall thickness was normal. Systolic function was normal. The estimated ejection fraction was in the range of 55% to 60%. Wall motion was normal; there were no regional wall motion abnormalities. Doppler parameters are consistent with abnormal left ventricular relaxation (grade 1 diastolic dysfunction).  Patient Profile     77 y.o. female with hx of HTN, HL, GERD, asthma, and MVP who presented with syncopal episode and chest pain.  Assessment & Plan    1.  Chest pain with mildly elevated troponin: -Cardiac cath completed 07/04/2017 with moderate mid LAD disease, moderate elevated LVEDP suggestive of systemic hypertensive heart disease, otherwise no significant CAD -Denies chest pain this morning or overnight -Cath site unremarkable, right wrist with no erythema or oozing.  -ASA 81 mg, carvedilol 12.5 mg, statin   2.  History of tobacco use with newly detected lung mass concerning for malignancy: -Per pulmonary, suspicion that her chest pain is related to new lung mass -Plan for FOB with endobronchial biopsy today, 07/05/17.  Pulmonary anticipates ability to obtain tissue sample.   -Hep gtt off post cath 07/04/2017   3.  HTN: -Stable with mild elevations, 144/77, 147/85, 145/87 -Amlodipine 10 mg, carvedilol 12.5 mg -Consider increasing carvedilol to 25 mg for greater blood pressure control  4.  Known history of anxiety: -Stable> continue scheduled  Ativan 1 mg PO BID  Signed, Kathyrn Drown NP-C [facility] Pager: [phone] 07/05/2017, 8:22 AM     I have seen and examined the patient along with Kathyrn Drown NP-C.  I have reviewed the chart, notes and new data.  I agree with NP's note.  Key new complaints: relieved she does not have significant CAD, no cardiac complaints Key examination changes: healthy right radial cath access site Key new findings / data: cath reviewed  PLAN: No plan for additional cardiac workup. Statin for target LDL<70 for non-critical CAD. Bronchoscopy later today. Suspect some of her BP elevation and tachycardia are related to anxiety/apprehension. Reevaluate BP tomorrow. No changes today. OK to increase carvedilol if BP elevation persists.  Sanda Klein, MD, [place] [phone] 07/05/2017, 10:41 AM   For questions or  updates, please contact   Please consult [email or web address] for contact info under Cardiology/STEMI.

## 2017-07-05 NOTE — Progress Notes (Signed)
PROGRESS NOTE    Amanda Jackson  [id number] DOB: 1941/03/14 DOA: 07/01/2017 PCP: Midge Minium, MD   Brief Narrative: Amanda Jackson is a 77 y.o. female with medical history significant of hypertension, vocal fold paralysis/hoarseness, allergic rhinitis, anxiety, GERD. She presented with chest pain and syncope. ACS workup significant for elevated troponin. Cardiology consulted. Incidentally, she was found to have a lung mass concerning for malignancy. Pulmonology consulted and will follow-up as an outpatient.   Assessment & Plan:   Principal Problem:   Chest pain Active Problems:   Essential hypertension   Anxiety   GERD (gastroesophageal reflux disease)   Syncope   Hoarseness of voice   Mass of left lung   NSTEMI (non-ST elevated myocardial infarction) (HCC)   Atypical chest pain Heart score of 5. Chest pain recurrent. Possibly GI related as patient has history of GERD. EKG unremarkable. Troponin elevated and trended down. Echocardiogram without significant structural/functional abnormalities. Cath performed on 3/11 without significant vessel disease. -Cardiology recommendations  Mass-like consolidation Concerning for cancer. Patient with mildly elevated WBC with chills. Could possibly be an abscess but clinical suspicion is very low. Cough is chronic and non-productive. WBC resolved without antibiotics. -Pulmonology recommendations: endobronchial biopsy today  Syncope Likely vasovagal vs secondary to nitroglycerin. Loss of consciousness without head injury.  Essential hypertension Initially better controlled. Patient also with more tachycardia. Very anxious. -Continue amlodipine, losartan, spironolactone, Coreg  Anxiety Slightly worsened. Required ativan IV for cath. Some symptoms today -Continue Ativan BID  GERD Patient takes omeprazole as an outpatient -Protonix BID  Persistent cough Allergies Follows with allergist. -Continue Singulair,  Pulmicort, Flonase -Albuterol prn  Hoarseness Patient with diagnosis of vocal fold paralysis. Sees ENT as an outpatient.  Leukocytosis Elevated again today. No source of infection other than lung mass. Patient is stable otherwise, so would doubt abscess. Afebrile.    DVT prophylaxis: Heparin gtt Code Status:   Code Status: Full Code Family Communication: None at bedside Disposition Plan: Discharge pending cardiology   Consultants:   Cardiology  Pulmonology  Procedures:   Transthoracic Echocardiogram (07/02/2017) Study Conclusions  - Left ventricle: The cavity size was normal. Wall thickness was   normal. Systolic function was normal. The estimated ejection   fraction was in the range of 55% to 60%. Wall motion was normal;   there were no regional wall motion abnormalities. Doppler   parameters are consistent with abnormal left ventricular   relaxation (grade 1 diastolic dysfunction). - Pulmonary arteries: Systolic pressure was mildly increased. PA   peak pressure: 32 mm Hg (S).  Antimicrobials:  None    Subjective: Dyspnea this morning with anxiety symptoms as well. No chest pain.  Objective: Vitals:   07/04/17 2022 07/05/17 0333 07/05/17 0853 07/05/17 0900  BP: (!) 144/77     Pulse: 95     Resp: 17 18    Temp: 97.7 F (36.5 C) 98.1 F (36.7 C)    TempSrc: Oral Oral    SpO2: 97% 98% 98% 98%  Weight:  52.3 kg (115 lb 6.4 oz)    Height:        Intake/Output Summary (Last 24 hours) at 07/05/2017 1215 Last data filed at 07/05/2017 0300 Gross per 24 hour  Intake 567.5 ml  Output -  Net 567.5 ml   Filed Weights   07/03/17 0456 07/04/17 0532 07/05/17 0333  Weight: 52.8 kg (116 lb 4.8 oz) 52.4 kg (115 lb 8 oz) 52.3 kg (115 lb 6.4 oz)    Examination:  General exam: Appears calm and comfortable Respiratory: mild end-expiratory wheezing. Speaks in complete sentences. Cardiovascular system: Regular rate and rhythm. Normal S1 and S2. No heart murmurs  present. No extra heart sounds Gastrointestinal system: Abdomen is nondistended, soft and nontender. No organomegaly or masses felt. Normal bowel sounds heard. Central nervous system: Alert and oriented. No focal neurological deficits. Extremities: No edema. No calf tenderness Skin: No cyanosis. No rashes Psychiatry: Judgement and insight appear normal. Anxious    Data Reviewed: I have personally reviewed following labs and imaging studies  CBC: Recent Labs  Lab 07/01/17 1432 07/02/17 0657 07/04/17 0405 07/05/17 0346  WBC 12.3* 9.8 15.9* 15.8*  HGB 12.7 12.7 12.9 13.4  HCT 39.2 38.5 39.4 39.9  MCV 93.1 94.1 93.8 93.9  PLT 313 299 302 102   Basic Metabolic Panel: Recent Labs  Lab 07/01/17 1432 07/02/17 0657 07/03/17 1437 07/04/17 0405 07/05/17 0346  NA 136 137 132* 136 135  K 3.9 3.7 3.9 3.6 3.8  CL 107 109 103 104 105  CO2 20* 18* 21* 23 19*  GLUCOSE 104* 113* 105* 109* 113*  BUN 13 12 6 7 8   CREATININE 0.68 0.62 0.93 0.65 0.72  CALCIUM 9.4 9.2 9.2 9.5 9.8   GFR: Estimated Creatinine Clearance: 45.1 mL/min (by C-G formula based on SCr of 0.72 mg/dL). Liver Function Tests: No results for input(s): AST, ALT, ALKPHOS, BILITOT, PROT, ALBUMIN in the last 168 hours. No results for input(s): LIPASE, AMYLASE in the last 168 hours. No results for input(s): AMMONIA in the last 168 hours. Coagulation Profile: Recent Labs  Lab 07/04/17 0405  INR 1.07   Cardiac Enzymes: Recent Labs  Lab 07/02/17 0209 07/02/17 0657 07/03/17 0745 07/03/17 1437 07/03/17 1814  TROPONINI 0.15* 0.12* 0.07* 0.05* 0.06*   BNP (last 3 results) No results for input(s): PROBNP in the last 8760 hours. HbA1C: No results for input(s): HGBA1C in the last 72 hours. CBG: No results for input(s): GLUCAP in the last 168 hours. Lipid Profile: No results for input(s): CHOL, HDL, LDLCALC, TRIG, CHOLHDL, LDLDIRECT in the last 72 hours. Thyroid Function Tests: No results for input(s): TSH, T4TOTAL,  FREET4, T3FREE, THYROIDAB in the last 72 hours. Anemia Panel: No results for input(s): VITAMINB12, FOLATE, FERRITIN, TIBC, IRON, RETICCTPCT in the last 72 hours. Sepsis Labs: Recent Labs  Lab 07/01/17 1951  PROCALCITON 0.50    No results found for this or any previous visit (from the past 240 hour(s)).       Radiology Studies: Dg Chest Port 1 View  Result Date: 07/05/2017 CLINICAL DATA:  Dyspnea EXAM: PORTABLE CHEST 1 VIEW COMPARISON:  CT chest 07/01/2017 FINDINGS: Persistent masslike consolidation of left upper lobe with volume loss. Small left pleural effusion. Right lung is clear. No pneumothorax. Stable heart size. No acute osseous abnormality. IMPRESSION: Persistent masslike consolidation of the left upper lobe with volume loss and a small left pleural effusion. Electronically Signed   By: Kathreen Devoid   On: 07/05/2017 09:42        Scheduled Meds: . amLODipine  5 mg Oral Daily  . atorvastatin  40 mg Oral q1800  . budesonide  0.5 mg Nebulization BID  . carvedilol  12.5 mg Oral BID WC  . folic acid  1 mg Oral Daily  . LORazepam  0.5 mg Intravenous Once  . LORazepam  1 mg Oral BID  . losartan  50 mg Oral Daily  . montelukast  10 mg Oral QHS  . pantoprazole  40 mg Oral  Daily  . potassium chloride SA  20 mEq Oral Daily  . sodium chloride flush  3 mL Intravenous Q12H  . spironolactone  25 mg Oral Daily  . vitamin B-12  2,500 mcg Oral Daily   Continuous Infusions: . sodium chloride       LOS: 0 days     Cordelia Poche, MD [facility] 07/05/2017, 12:15 PM Pager: [phone]  If 7PM-7AM, please contact night-coverage [email or web address] Password [facility] 07/05/2017, 12:15 PM

## 2017-07-05 NOTE — Op Note (Signed)
[facility] Cardiopulmonary Patient Name: Amanda Jackson Date: 07/05/2017 MRN: [id number] Attending MD: Collene Gobble , MD Date of Birth: November 30, 1940 CSN: Finalized Age: 77 Admit Type: Inpatient Gender: Female Procedure:            Bronchoscopy Indications:          Left upper lobe mass, Atelectasis of the left upper lobe Providers:            Collene Gobble, MD, Cherre Huger RRT, RCP, Andre Lefort RRT,RCP Referring MD:          Medicines:            Midazolam 6 mg IV, Fentanyl 175 mcg IV, Lidocaine 1%                        applied to cords 8 mL, Lidocaine 1% applied to the                        tracheobronchial tree 16 mL Complications:        No immediate complications Estimated Blood Loss: Estimated blood loss was minimal. Procedure:            Pre-Anesthesia Assessment:                       - A History and Physical has been performed. Patient                        meds and allergies have been reviewed. The risks and                        benefits of the procedure and the sedation options and                        risks were discussed with the patient. All questions                        were answered and informed consent was obtained.                        Patient identification and proposed procedure were                        verified prior to the procedure by the physician in the                        procedure room. Mental Status Examination: alert and                        oriented. Airway Examination: normal oropharyngeal                        airway. Respiratory Examination: clear to auscultation.                        CV Examination: RRR, no murmurs, no S3 or S4. ASA Grade  Assessment: II - A patient with mild systemic disease.                        After reviewing the risks and benefits, the patient was                        deemed in satisfactory condition to undergo the             procedure. The anesthesia plan was to use moderate                        sedation / analgesia (conscious sedation). Immediately                        prior to administration of medications, the patient was                        re-assessed for adequacy to receive sedatives. The                        heart rate, respiratory rate, oxygen saturations, blood                        pressure, adequacy of pulmonary ventilation, and                        response to care were monitored throughout the                        procedure. The physical status of the patient was                        re-assessed after the procedure.                       After obtaining informed consent, the bronchoscope was                        passed under direct vision. Throughout the procedure,                        the patient's blood pressure, pulse, and oxygen                        saturations were monitored continuously. the [id number]                        [id number] scope was introduced through the right nostril                        and advanced to the tracheobronchial tree. The                        procedure was accomplished without difficulty. The                        patient tolerated the procedure fairly well. Scope In: 2:19:53 PM Scope Out: 2:45:39 PM Findings:      The nasopharynx/oropharynx appears normal. The larynx appears normal.       The vocal cords appear normal. The subglottic  space is normal. The       trachea is of normal caliber. The carina is sharp. The tracheobronchial       tree of the right lung was examined to at least the first subsegmental       level. Bronchial mucosa and anatomy in the right lung are normal; there       are no endobronchial lesions, and no secretions.      Left Lung Abnormalities: Edema was found in the left upper lobe.       Erythema was found in the left upper lobe. Extrinsic compression was       found in the left upper lobe. The airway is  moderately narrowed. The       lesion was not traversed.      Brushings of a lesion were obtained in the left upper lobe and in the       left lower lobe with a cytology brush and sent for routine cytology.       Four samples were obtained.      Endobronchial biopsies of a lesion were performed in the left upper lobe       and in the left lower lobe using a forceps and sent for histopathology       examination. Five samples were obtained.      Transbronchial biopsies of a lesion were performed in the left upper       lobe using a 20 gauge needle and sent for routine cytology.       Transbronchial biopsy technique was selected because the sampling site       was not visible endoscopically. One biopsy pass was performed. Impression:           - Left upper lobe mass                       - Atelectasis of the left upper lobe                       - The right lung was normal.                       - Edema was present in the left upper lobe.                       - Erythema was present in the left upper lobe.                       - Extrinsic compression was found in the left upper                        lobe secondary to a presumed mass. No discrete                        endobronchial masss could be seen                       - Brushings were obtained.                       - Endobronchial biopsies were performed.                       - Transbronchial lung biopsy was performed. Moderate Sedation:  Moderate (conscious) sedation was personally administered by the       endoscopist. The following parameters were monitored: oxygen saturation,       heart rate, blood pressure, respiratory rate, EKG, adequacy of pulmonary       ventilation, and response to care. Total physician intraservice time was       36 minutes. Recommendation:       - Await biopsy, brushing and cytology results. Procedure Code(s):    --- Professional ---                       [phone], 39, Bronchoscopy, rigid or  flexible, including                        fluoroscopic guidance, when performed; with                        transbronchial lung biopsy(s), single lobe                       78242, 59, Bronchoscopy, rigid or flexible, including                        fluoroscopic guidance, when performed; with bronchial                        or endobronchial biopsy(s), single or multiple sites                       31623, Bronchoscopy, rigid or flexible, including                        fluoroscopic guidance, when performed; with brushing or                        protected brushings                       99152, Moderate sedation services provided by the same                        physician or other qualified health care professional                        performing the diagnostic or therapeutic service that                        the sedation supports, requiring the presence of an                        independent trained observer to assist in the                        monitoring of the patient's level of consciousness and                        physiological status; initial 15 minutes of                        intraservice time, patient age 87 years or older  76283, Moderate sedation services; each additional 15                        minutes intraservice time Diagnosis Code(s):    --- Professional ---                       R91.8, Other nonspecific abnormal finding of lung field                       J98.11, Atelectasis                       R09.89, Other specified symptoms and signs involving                        the circulatory and respiratory systems                       J98.4, Other disorders of lung CPT copyright 2016 [employer]. All rights reserved. The codes documented in this report are preliminary and upon coder review may  be revised to meet current compliance requirements. Collene Gobble, MD Collene Gobble, MD 07/05/2017 3:10:11 PM Number of  Addenda: 0

## 2017-07-05 NOTE — Interval H&P Note (Signed)
PCCM INterval Note  Pt presents for her her FOB with Bx's.  All questions answered Risks and benefits discussed No barriers identified.  Will proceed  Baltazar Apo, MD, PhD 07/05/2017, 2:53 PM [name] [phone] or if no answer [phone]

## 2017-07-06 ENCOUNTER — Encounter (HOSPITAL_COMMUNITY): Payer: Self-pay | Admitting: Emergency Medicine

## 2017-07-06 ENCOUNTER — Inpatient Hospital Stay (HOSPITAL_COMMUNITY): Payer: Medicare Other

## 2017-07-06 LAB — BASIC METABOLIC PANEL
Anion gap: 9 (ref 5–15)
BUN: 11 mg/dL (ref 6–20)
CHLORIDE: 105 mmol/L (ref 101–111)
CO2: 21 mmol/L — ABNORMAL LOW (ref 22–32)
Calcium: 9.6 mg/dL (ref 8.9–10.3)
Creatinine, Ser: 0.65 mg/dL (ref 0.44–1.00)
GFR calc Af Amer: >60 mL/min (ref >60)
GFR calc non Af Amer: >60 mL/min (ref >60)
GLUCOSE: 98 mg/dL (ref 65–99)
POTASSIUM: 3.5 mmol/L (ref 3.5–5.1)
Sodium: 135 mmol/L (ref 135–145)

## 2017-07-06 LAB — CBC
HCT: 37.3 % (ref 36.0–46.0)
Hemoglobin: 12.3 g/dL (ref 12.0–15.0)
MCH: 31 pg (ref 26.0–34.0)
MCHC: 33 g/dL (ref 30.0–36.0)
MCV: 94 fL (ref 78.0–100.0)
Platelets: 272 10*3/uL (ref 150–400)
RBC: 3.97 MIL/uL (ref 3.87–5.11)
RDW: 13.6 % (ref 11.5–15.5)
WBC: 18.8 10*3/uL — ABNORMAL HIGH (ref 4.0–10.5)

## 2017-07-06 MED ORDER — SIMETHICONE 80 MG PO CHEW
80.0000 mg | CHEWABLE_TABLET | Freq: Once | ORAL | Status: AC
  Administered 2017-07-06: 80 mg via ORAL
  Filled 2017-07-06: qty 1

## 2017-07-06 MED ORDER — CARVEDILOL 25 MG PO TABS
25.0000 mg | ORAL_TABLET | Freq: Two times a day (BID) | ORAL | Status: DC
  Administered 2017-07-06 – 2017-07-08 (×5): 25 mg via ORAL
  Filled 2017-07-06 (×5): qty 1

## 2017-07-06 MED ORDER — LEVOFLOXACIN IN D5W 500 MG/100ML IV SOLN
500.0000 mg | INTRAVENOUS | Status: DC
Start: 2017-07-06 — End: 2017-07-08
  Administered 2017-07-06 – 2017-07-08 (×3): 500 mg via INTRAVENOUS
  Filled 2017-07-06 (×4): qty 100

## 2017-07-06 NOTE — Progress Notes (Signed)
PROGRESS NOTE    Amanda Jackson  [id number] DOB: [birth year] DOA: 07/01/2017 PCP: Midge Minium, MD   Brief Narrative: Amanda Jackson is a 77 y.o. female with medical history significant of hypertension, vocal fold paralysis/hoarseness, allergic rhinitis, anxiety, GERD. She presented with chest pain and syncope. ACS workup significant for elevated troponin. Cardiology consulted. Incidentally, she was found to have a lung mass concerning for malignancy. Pulmonology consulted and will follow-up as an outpatient.   Assessment & Plan:   Principal Problem:   Chest pain Active Problems:   Essential hypertension   Anxiety   GERD (gastroesophageal reflux disease)   Syncope   Hoarseness of voice   Mass of left lung   NSTEMI (non-ST elevated myocardial infarction) (HCC)   Atypical chest pain Heart score of 5. Chest pain recurrent. Possibly GI related as patient has history of GERD. EKG unremarkable. Troponin elevated and trended down. Echocardiogram without significant structural/functional abnormalities. Cath performed on 3/11 without significant vessel disease. -Cardiology recommendations  Mass-like consolidation Concerning for cancer. Patient with mildly elevated WBC with chills. Could possibly be an abscess but clinical suspicion is very low. Cough is chronic and non-productive. WBC resolved without antibiotics. -Pulmonology recommendations: endobronchial biopsy today  Syncope Likely vasovagal vs secondary to nitroglycerin. Loss of consciousness without head injury.  Essential hypertension Initially better controlled. Patient also with more tachycardia. Very anxious. - Continue amlodipine, losartan, spironolactone, Coreg  Anxiety Slightly worsened. Required ativan IV for cath. Some symptoms today -Continue Ativan BID  GERD Patient takes omeprazole as an outpatient -Protonix BID  Persistent cough Allergies Follows with allergist. -Continue Singulair,  Pulmicort, Flonase -Albuterol prn  Hoarseness Patient with diagnosis of vocal fold paralysis. Sees ENT as an outpatient.  Leukocytosis Elevated again today. No source of infection other than lung mass. Will cover with levaquin for now and monitor wbc levels.  DVT prophylaxis: Heparin gtt Code Status:   Code Status: Full Code Family Communication: None at bedside Disposition Plan: Discharge pending cardiology   Consultants:   Cardiology  Pulmonology  Procedures:   Transthoracic Echocardiogram (07/02/2017) Study Conclusions  - Left ventricle: The cavity size was normal. Wall thickness was   normal. Systolic function was normal. The estimated ejection   fraction was in the range of 55% to 60%. Wall motion was normal;   there were no regional wall motion abnormalities. Doppler   parameters are consistent with abnormal left ventricular   relaxation (grade 1 diastolic dysfunction). - Pulmonary arteries: Systolic pressure was mildly increased. PA   peak pressure: 32 mm Hg (S).  Antimicrobials:  None    Subjective: No new complaints reported to me by patient.  Objective: Vitals:   07/06/17 0455 07/06/17 0838 07/06/17 1003 07/06/17 1300  BP: (!) 155/87 (!) 164/90  116/68  Pulse:  (!) 105  90  Resp:      Temp: 97.6 F (36.4 C)   97.7 F (36.5 C)  TempSrc: Oral   Oral  SpO2: 96%  97%   Weight: 52.3 kg (115 lb 3.2 oz)     Height:        Intake/Output Summary (Last 24 hours) at 07/06/2017 1704 Last data filed at 07/06/2017 1349 Gross per 24 hour  Intake 720 ml  Output -  Net 720 ml   Filed Weights   07/05/17 0333 07/05/17 1314 07/06/17 0455  Weight: 52.3 kg (115 lb 6.4 oz) 52.2 kg (115 lb) 52.3 kg (115 lb 3.2 oz)    Examination:  General exam: Appears  calm and comfortable, in nad. Respiratory: Speaks in complete sentences. Equal chest rise, decreased breath sounds over right lung field. Cardiovascular system: Regular rate and rhythm. Normal S1 and S2. No  heart murmurs present. No extra heart sounds Gastrointestinal system: Abdomen is nondistended, soft and nontender. No organomegaly or masses felt. Normal bowel sounds heard. Central nervous system: Alert and oriented. No focal neurological deficits. Extremities: No edema. No calf tenderness Skin: No cyanosis. No rashes Psychiatry: Judgement and insight appear normal. Anxious    Data Reviewed: I have personally reviewed following labs and imaging studies  CBC: Recent Labs  Lab 07/01/17 1432 07/02/17 0657 07/04/17 0405 07/05/17 0346 07/06/17 0409  WBC 12.3* 9.8 15.9* 15.8* 18.8*  HGB 12.7 12.7 12.9 13.4 12.3  HCT 39.2 38.5 39.4 39.9 37.3  MCV 93.1 94.1 93.8 93.9 94.0  PLT 313 299 302 315 500   Basic Metabolic Panel: Recent Labs  Lab 07/02/17 0657 07/03/17 1437 07/04/17 0405 07/05/17 0346 07/06/17 0409  NA 137 132* 136 135 135  K 3.7 3.9 3.6 3.8 3.5  CL 109 103 104 105 105  CO2 18* 21* 23 19* 21*  GLUCOSE 113* 105* 109* 113* 98  BUN 12 6 7 8 11   CREATININE 0.62 0.93 0.65 0.72 0.65  CALCIUM 9.2 9.2 9.5 9.8 9.6   GFR: Estimated Creatinine Clearance: 45.1 mL/min (by C-G formula based on SCr of 0.65 mg/dL). Liver Function Tests: No results for input(s): AST, ALT, ALKPHOS, BILITOT, PROT, ALBUMIN in the last 168 hours. No results for input(s): LIPASE, AMYLASE in the last 168 hours. No results for input(s): AMMONIA in the last 168 hours. Coagulation Profile: Recent Labs  Lab 07/04/17 0405  INR 1.07   Cardiac Enzymes: Recent Labs  Lab 07/02/17 0209 07/02/17 0657 07/03/17 0745 07/03/17 1437 07/03/17 1814  TROPONINI 0.15* 0.12* 0.07* 0.05* 0.06*   BNP (last 3 results) No results for input(s): PROBNP in the last 8760 hours. HbA1C: No results for input(s): HGBA1C in the last 72 hours. CBG: No results for input(s): GLUCAP in the last 168 hours. Lipid Profile: No results for input(s): CHOL, HDL, LDLCALC, TRIG, CHOLHDL, LDLDIRECT in the last 72 hours. Thyroid  Function Tests: No results for input(s): TSH, T4TOTAL, FREET4, T3FREE, THYROIDAB in the last 72 hours. Anemia Panel: No results for input(s): VITAMINB12, FOLATE, FERRITIN, TIBC, IRON, RETICCTPCT in the last 72 hours. Sepsis Labs: Recent Labs  Lab 07/01/17 1951  PROCALCITON 0.50    No results found for this or any previous visit (from the past 240 hour(s)).       Radiology Studies: Dg Chest Port 1 View  Result Date: 07/06/2017 CLINICAL DATA:  Hypoxia EXAM: PORTABLE CHEST 1 VIEW COMPARISON:  Chest CT July 01, 2017 and chest radiograph July 05, 2017 FINDINGS: There is consolidation with volume loss in the left upper lobe. Suspect underlying mass in this area. Right lung is mildly hyperexpanded but clear. Heart size within normal limits. Pulmonary vascularity within normal limits. Areas of adenopathy seen on CT are less well seen on this radiographic examination. IMPRESSION: Consolidation with apparent mass left upper lobe. There is volume loss on the left. Right lung clear. Stable cardiac silhouette. Electronically Signed   By: Lowella Grip III M.D.   On: 07/06/2017 08:19   Dg Chest Port 1 View  Result Date: 07/05/2017 CLINICAL DATA:  Dyspnea EXAM: PORTABLE CHEST 1 VIEW COMPARISON:  CT chest 07/01/2017 FINDINGS: Persistent masslike consolidation of left upper lobe with volume loss. Small left pleural effusion. Right lung  is clear. No pneumothorax. Stable heart size. No acute osseous abnormality. IMPRESSION: Persistent masslike consolidation of the left upper lobe with volume loss and a small left pleural effusion. Electronically Signed   By: Kathreen Devoid   On: 07/05/2017 09:42        Scheduled Meds: . amLODipine  5 mg Oral Daily  . atorvastatin  40 mg Oral q1800  . budesonide  0.5 mg Nebulization BID  . carvedilol  25 mg Oral BID WC  . folic acid  1 mg Oral Daily  . LORazepam  0.5 mg Intravenous Once  . LORazepam  1 mg Oral BID  . losartan  50 mg Oral Daily  . montelukast   10 mg Oral QHS  . pantoprazole  40 mg Oral Daily  . potassium chloride SA  20 mEq Oral Daily  . sodium chloride flush  3 mL Intravenous Q12H  . spironolactone  25 mg Oral Daily  . vitamin B-12  2,500 mcg Oral Daily   Continuous Infusions: . sodium chloride    . levofloxacin (LEVAQUIN) IV Stopped (07/06/17 1313)     LOS: 1 day     Velvet Bathe, MD [facility] 07/06/2017, 5:04 PM Pager: [phone]  If 7PM-7AM, please contact night-coverage [email or web address] Password [facility] 07/06/2017, 5:04 PM

## 2017-07-06 NOTE — Progress Notes (Signed)
Pharmacy Antibiotic Note  Amanda Jackson is a 76 y.o. female admitted on 07/01/2017 with chest pain. S/p cardiac cath on 07/04/17.  Pharmacy has been consulted for Levaquin dosing for CAP.  Plan: Levaquin 500 mg IV q24h Monitor clinical status daily , cx's , renal fxn.    Height: 5\' 1"  (154.9 cm) Weight: 115 lb 3.2 oz (52.3 kg) IBW/kg (Calculated) : 47.8  Temp (24hrs), Avg:97.7 F (36.5 C), Min:97.6 F (36.4 C), Max:97.9 F (36.6 C)  Recent Labs  Lab 07/01/17 1432 07/02/17 0657 07/03/17 1437 07/04/17 0405 07/05/17 0346 07/06/17 0409  WBC 12.3* 9.8  --  15.9* 15.8* 18.8*  CREATININE 0.68 0.62 0.93 0.65 0.72 0.65    Estimated Creatinine Clearance: 45.1 mL/min (by C-G formula based on SCr of 0.65 mg/dL).    Allergies  Allergen Reactions  . Tiotropium Swelling    Facial and lip swelling,     Antimicrobials this admission: Levaquin 3/13>>  Dose adjustments this admission: n/a  Microbiology results: none   Thank you for allowing pharmacy to be a part of this patient's care. Nicole Cella, [facility] Clinical Pharmacist Pager: [phone] or [phone]) Main Rx [phone] 07/06/2017 10:07 AM

## 2017-07-06 NOTE — Care Management Note (Addendum)
Case Management Note  Patient Details  Name: Amanda Jackson MRN: [id number] Date of Birth: May 03, 1940  Subjective/Objective: Pt presented for Chest Pain with cath on 07-04-17 revealed no significant CAD. Pt now on IV Levaquin for CAP.                     Action/Plan: CM will continue to monitor for disposition needs.   Expected Discharge Date:                  Expected Discharge Plan:  Home/Self Care  In-House Referral:  NA  Discharge planning Services  CM Consult  Post Acute Care Choice:   Durable Medical Equipment Choice offered to:   Patient  DME Arranged:   Oxygen DME Agency:   [facility]- pt did not have Qualifying sat's to leave with 02.   HH Arranged:   N/A HH Agency:    N/A  Status of Service:  Completed  If discussed at [place] of Stay Meetings, dates discussed:  07-07-17  Additional Comments: 07-08-17 [name], RN,BSN [phone] Staff RN ambulated patient and pt does not have qualifying 02 sats for insurance to pay for 02. No DME 02 needs at this time.    07-08-17 [name], RN,BSN  CM received consult for DME 02. CM did ask patient which company she wants to use and she stated [employer]. She wants to see if she can be eligible for the [employer] tank. If pt has sats below 88% she can qualify for home 02. Pt will need Qualifying Dx. Listed Asthma and Lung Mass- dx will have to be submitted to insurance to see if will pay for E tank leaving the hospital and [employer]ogen tank once pt gets home and Respiratory from [place] visits the patient. Pt's PCP will continue to write orders for the [employer]. CM awaiting Sats to see if she qualifies. CM did call Ronnell [place] with [employer] [facility].  [name], RN 07/06/2017, 3:35 PM

## 2017-07-06 NOTE — Progress Notes (Addendum)
Progress Note  Patient Name: Amanda Jackson Date of Encounter: 07/06/2017  Primary Cardiologist: Dr. Harl Bowie  Subjective   Patient doing well this morning. Continues to have trouble breathing. Saturations are stable. Denies chest pain overnight.  Bronchoscopy completed yesterday afternoon without complication.  Inpatient Medications    Scheduled Meds: . amLODipine  5 mg Oral Daily  . atorvastatin  40 mg Oral q1800  . budesonide  0.5 mg Nebulization BID  . carvedilol  25 mg Oral BID WC  . folic acid  1 mg Oral Daily  . LORazepam  0.5 mg Intravenous Once  . LORazepam  1 mg Oral BID  . losartan  50 mg Oral Daily  . montelukast  10 mg Oral QHS  . pantoprazole  40 mg Oral Daily  . potassium chloride SA  20 mEq Oral Daily  . sodium chloride flush  3 mL Intravenous Q12H  . spironolactone  25 mg Oral Daily  . vitamin B-12  2,500 mcg Oral Daily   Continuous Infusions: . sodium chloride     PRN Meds: sodium chloride, acetaminophen, albuterol, ondansetron (ZOFRAN) IV, sodium chloride flush, traMADol, zolpidem   Vital Signs    Vitals:   07/05/17 1950 07/05/17 2041 07/05/17 2045 07/06/17 0455  BP:  94/74 128/80 (!) 155/87  Pulse:  98    Resp:  17    Temp:  97.9 F (36.6 C)  97.6 F (36.4 C)  TempSrc:  Oral  Oral  SpO2: 94% 96% 97% 96%  Weight:    115 lb 3.2 oz (52.3 kg)  Height:        Intake/Output Summary (Last 24 hours) at 07/06/2017 0754 Last data filed at 07/05/2017 2041 Gross per 24 hour  Intake 360 ml  Output -  Net 360 ml   Filed Weights   07/05/17 0333 07/05/17 1314 07/06/17 0455  Weight: 115 lb 6.4 oz (52.3 kg) 115 lb (52.2 kg) 115 lb 3.2 oz (52.3 kg)    Physical Exam   General: Well developed, well nourished, NAD Skin: Warm, dry, intact  Head: Normocephalic, atraumatic, clear, moist mucus membranes. Neck: Negative for carotid bruits. No JVD Lungs: Diffuse rhonchi bilaterally R>L. Positive wheezing bilaterally. Breathing is  unlabored. Cardiovascular: RRR with S1 S2. No murmurs, rubs, or gallops  Abdomen: Soft, non-tender, non-distended with normoactive bowel sounds. No obvious abdominal masses. MSK: Strength and tone appear normal for age. 5/5 in all extremities Extremities: No edema. No clubbing or cyanosis. DP/PT pulses 2+ bilaterally. Right wrist cath site, unremarkable Neuro: Alert and oriented. No focal deficits. No facial asymmetry. MAE spontaneously. Psych: Responds to questions appropriately with normal affect.    Labs    Chemistry Recent Labs  Lab 07/04/17 0405 07/05/17 0346 07/06/17 0409  NA 136 135 135  K 3.6 3.8 3.5  CL 104 105 105  CO2 23 19* 21*  GLUCOSE 109* 113* 98  BUN 7 8 11   CREATININE 0.65 0.72 0.65  CALCIUM 9.5 9.8 9.6  GFRNONAA >60 >60 >60  GFRAA >60 >60 >60  ANIONGAP 9 11 9      Hematology Recent Labs  Lab 07/04/17 0405 07/05/17 0346 07/06/17 0409  WBC 15.9* 15.8* 18.8*  RBC 4.20 4.25 3.97  HGB 12.9 13.4 12.3  HCT 39.4 39.9 37.3  MCV 93.8 93.9 94.0  MCH 30.7 31.5 31.0  MCHC 32.7 33.6 33.0  RDW 13.8 13.5 13.6  PLT 302 315 272    Cardiac Enzymes Recent Labs  Lab 07/02/17 0657 07/03/17 0745  07/03/17 1437 07/03/17 1814  TROPONINI 0.12* 0.07* 0.05* 0.06*    Recent Labs  Lab 07/01/17 1455  TROPIPOC 0.16*     BNPNo results for input(s): BNP, PROBNP in the last 168 hours.   DDimer No results for input(s): DDIMER in the last 168 hours.   Radiology    Dg Chest Port 1 View  Result Date: 07/05/2017 CLINICAL DATA:  Dyspnea EXAM: PORTABLE CHEST 1 VIEW COMPARISON:  CT chest 07/01/2017 FINDINGS: Persistent masslike consolidation of left upper lobe with volume loss. Small left pleural effusion. Right lung is clear. No pneumothorax. Stable heart size. No acute osseous abnormality. IMPRESSION: Persistent masslike consolidation of the left upper lobe with volume loss and a small left pleural effusion. Electronically Signed   By: Kathreen Devoid   On: 07/05/2017 09:42     Telemetry     07/06/17 SR/ST HR 92- Personally Reviewed  ECG    No new tracings as of 07/06/2017- Personally Reviewed  Cardiac Studies   Cath 07/04/17:   Prox LAD to Mid LAD lesion is 45% stenosed. With otherwise normal coronary arteries.  LV end diastolic pressure is moderately elevated.  Normal LV function by Echocardiogram  Angiographically moderate disease in the mid LAD but otherwise no significant disease.  Very tortuous coronary arteries. Mild to moderately elevated LVEDP. -->Suggestive of Systemic Hypertension/Hypertensive Heart Disease.   Echo 07/02/17: Study Conclusions  - Left ventricle: The cavity size was normal. Wall thickness was normal. Systolic function was normal. The estimated ejection fraction was in the range of 55% to 60%. Wall motion was normal; there were no regional wall motion abnormalities. Doppler parameters are consistent with abnormal left ventricular relaxation (grade 1 diastolic dysfunction). - Pulmonary arteries: Systolic pressure was mildly increased. PA peak pressure: 32 mm Hg (S Left ventricle: The cavity size was normal. Wall thickness was normal. Systolic function was normal. The estimated ejection fraction was in the range of 55% to 60%. Wall motion was normal; there were no regional wall motion abnormalities. Doppler parameters are consistent with abnormal left ventricular relaxation (grade 1 diastolic dysfunction).  Patient Profile     77 y.o. female with hxof HTN, HL, GERD, asthma, and MVP who presented with syncopal episode and chest pain. Cath Lab completed on 07/04/2017 with moderate mid LAD disease, otherwise no significant CAD.  Lung mass concerning for malignancy with bronchoscopy and mass biopsy completed 07/05/2017 with pending results.  Assessment & Plan    1.  Chest pain with mildly elevated troponin: -Cardiac cath on 07/04/2017 with moderate mid LAD disease, moderate elevated LV EDP suggestive  of systemic hypertensive heart disease with otherwise no significant CAD -Chest pain likely related to new lung mass -Denies chest pain this AM overnight -Right wrist cath site unremarkable -ASA 81 mg, carvedilol 25 mg, statin  2.  History of tobacco use with newly detected lung mass concerning for malignancy: -Per pulmonary, suspicion that her chest pain is related to new lung mass -Bronchoscopy with biopsy completed yesterday 07/05/2017 with pending results  3.  HTN: -Elevated, 155/87, 128/80, 138/82 -We will increase carvedilol to 25 mg PO BID given elevated heart rate and BP -Amlodipine 5mg   4.  Known history of anxiety: -Stable, continue scheduled Ativan 1 mg PO BID   Signed, Kathyrn Drown NP-C [facility] Pager: [phone] 07/06/2017, 7:54 AM    I have seen and examined the patient along with Kathyrn Drown NP-C.  I have reviewed the chart, notes and new data.  I agree with PA/NP's note.  Key new complaints: dyspnea unchanged Key examination changes: BP mildly elevated Key new findings / data: extrinsic compression of left main stem bronchus on bronchoscopy, pathology results pending  PLAN: Carvedilol dose adjusted. Will follow up on BP, but no additional cardiac workup is planned. She is at low risk for major CV complications if thoracic surgery is contemplated.  Sanda Klein, MD, [place] [phone] 07/06/2017, 11:05 AM  For questions or updates, please contact   Please consult [email or web address] for contact info under Cardiology/STEMI.

## 2017-07-07 LAB — CBC
HCT: 38.5 % (ref 36.0–46.0)
Hemoglobin: 12.6 g/dL (ref 12.0–15.0)
MCH: 30.6 pg (ref 26.0–34.0)
MCHC: 32.7 g/dL (ref 30.0–36.0)
MCV: 93.4 fL (ref 78.0–100.0)
Platelets: 289 10*3/uL (ref 150–400)
RBC: 4.12 MIL/uL (ref 3.87–5.11)
RDW: 13.4 % (ref 11.5–15.5)
WBC: 21.8 10*3/uL — AB (ref 4.0–10.5)

## 2017-07-07 MED ORDER — GUAIFENESIN-DM 100-10 MG/5ML PO SYRP
5.0000 mL | ORAL_SOLUTION | ORAL | Status: DC | PRN
  Administered 2017-07-07 – 2017-07-08 (×4): 5 mL via ORAL
  Filled 2017-07-07 (×4): qty 5

## 2017-07-07 MED ORDER — METHYLPREDNISOLONE SODIUM SUCC 40 MG IJ SOLR
40.0000 mg | Freq: Two times a day (BID) | INTRAMUSCULAR | Status: DC
  Administered 2017-07-07 – 2017-07-08 (×3): 40 mg via INTRAVENOUS
  Filled 2017-07-07 (×3): qty 1

## 2017-07-07 NOTE — Plan of Care (Signed)
  Progressing Clinical Measurements: Cardiovascular complication will be avoided 07/07/2017 0019 - Progressing by Colonel Bald, RN Note No s/s of cardiovascular complication. Coping: Level of anxiety will decrease 07/07/2017 0019 - Progressing by Colonel Bald, RN Note PO ativan effective. Elimination: Will not experience complications related to urinary retention 07/07/2017 0019 - Progressing by Colonel Bald, RN Note No s/s of urinary retention noted. Pain Managment: General experience of comfort will improve 07/07/2017 0019 - Progressing by Colonel Bald, RN Note Denies c/o pain or discomfort.

## 2017-07-07 NOTE — Progress Notes (Signed)
PULMONARY / CRITICAL CARE MEDICINE   Name: Amanda Jackson MRN: [id number] DOB: 23-Nov-1940    ADMISSION DATE:  07/01/2017 CONSULTATION DATE:  07/02/2017  REFERRING MD:  Dr. Lonny Prude, [facility]  CHIEF COMPLAINT:  Chest pain  HISTORY OF PRESENT ILLNESS:   77 yo female former smoker presented with chest pain.  As part of her evaluation she had chest xray that showed a left suprahilar density.  She then had CT chest that showed a mass like consolidation in Lt upper lobe and mediastinal and Lt hilar adenopathy.  She is noted to have dysphonia since November 2018.  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  She was advised to have a CT neck to further assess, but wasn't able to get this scheduled.  She hasn't had a recent chest xray before this admission.  She is followed by allergist for asthma.  She has dry cough.  She denies hemoptysis.  Her weight has been steady.  She gets sweats at night.  She is from [place].  She quit smoking years ago.  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  Denies history of PNA or TB.  No animal/bird exposures.    PAST MEDICAL HISTORY :  She  has a past medical history of Allergy, Anxiety, Arthritis, Asthma, Atypical chest pain, Colon polyp, GERD (gastroesophageal reflux disease), colonoscopy (03/18/09), Hyperlipidemia, Hypertension, Left nasal polyps, MVP (mitral valve prolapse), Seasonal allergies, and Tubulovillous adenoma.  PAST SURGICAL HISTORY: She  has a past surgical history that includes Laparoscopic Right Colectomy (01/25/03); Knee arthroscopy w/ lateral release (11/17/99); Knee arthroscopy w/ partial medial meniscectomy (11/17/99); Functional endoscopic sinus surgery (08/28/08); Abdominal hysterectomy; Breast surgery (2010); Tonsillectomy (1952); Polypectomy; Colonoscopy; Bunionectomy; nasal polyp removal; LEFT HEART CATH AND CORONARY ANGIOGRAPHY (N/A, 07/04/2017); and Video bronchoscopy (Bilateral, 07/05/2017).  Allergies  Allergen Reactions  .  Tiotropium Swelling    Facial and lip swelling,     No current facility-administered medications on file prior to encounter.    Current Outpatient Medications on File Prior to Encounter  Medication Sig  . acetaminophen (TYLENOL ARTHRITIS PAIN) 650 MG CR tablet Take 650 mg by mouth every 8 (eight) hours as needed for pain.  Marland Kitchen albuterol (PROVENTIL HFA;VENTOLIN HFA) 108 (90 Base) MCG/ACT inhaler INHALE TWO PUFFS EVERY 4 HOURS AS NEEDED FOR COUGH OR WHEEZE.  Marland Kitchen amLODipine (NORVASC) 5 MG tablet Take 1 tablet (5 mg total) by mouth daily.  Marland Kitchen aspirin 81 MG EC tablet Take 81 mg by mouth daily.    . budesonide (PULMICORT) 0.5 MG/2ML nebulizer solution Mix budesonide/saline nasal irrigation twice a day. (Patient taking differently: 0.5 mg See admin instructions. Mix budesonide (0.5 mg) with powdered OTC saline sinus rinse and approx 8 oz sterile water, squirt into both nostrils twice a day.)  . Calcium Carb-Cholecalciferol (CALCIUM 600+D3 PO) Take 600 mg by mouth 2 (two) times daily.   . carvedilol (COREG) 12.5 MG tablet TAKE ONE TABLET BY MOUTH TWICE DAILY WITH A MEAL (Patient taking differently: TAKE ONE TABLET (12.5 MG) BY MOUTH TWICE DAILY WITH A MEAL)  . Cyanocobalamin (VITAMIN B-12) 2500 MCG SUBL Place 2,500 mcg under the tongue daily.   . fluticasone (FLOVENT HFA) 220 MCG/ACT inhaler Inhale 2 puffs 2 (two) times daily into the lungs. (Patient taking differently: Inhale 3 puffs into the lungs 2 (two) times daily. )  . FOLBIC 2.5-25-2 MG TABS tablet TAKE 1 TABLET BY MOUTH EVERY DAY  . ibuprofen (ADVIL,MOTRIN) 800 MG tablet  TAKE 1 TABLET (800 MG TOTAL) BY MOUTH 3 (THREE) TIMES DAILY AS NEEDED FOR PAIN  . KLOR-CON M20 20 MEQ tablet TAKE 1 TABLET BY MOUTH EVERY DAY (Patient taking differently: TAKE 1 TABLET (20 MEQ)  BY MOUTH EVERY DAY)  . LORazepam (ATIVAN) 1 MG tablet TAKE 1 TABLET BY MOUTH TWICE A DAY (Patient taking differently: TAKE 1 TABLET (1 MG) BY MOUTH TWICE A DAY)  . losartan (COZAAR) 50 MG  tablet TAKE 1 TABLET BY MOUTH EVERY DAY (Patient taking differently: TAKE 1 TABLET (50 MG)  BY MOUTH EVERY DAY)  . montelukast (SINGULAIR) 10 MG tablet TAKE 1 TABLET BY MOUTH EVERY DAY (Patient taking differently: TAKE 1 TABLET (10 MG) BY MOUTH EVERY DAY AT BEDTIME)  . Multiple Vitamin (MULTIVITAMIN WITH MINERALS) TABS tablet Take 1 tablet by mouth daily. Centrum  . nitroGLYCERIN (NITROSTAT) 0.4 MG SL tablet Place 1 tablet (0.4 mg total) under the tongue every 5 (five) minutes as needed. For chest pain (patient unsure of MAX doses allowed) (Patient taking differently: Place 0.4 mg under the tongue every 5 (five) minutes as needed for chest pain. )  . omeprazole (PRILOSEC) 20 MG capsule TAKE 1 CAPSULE (20 MG TOTAL) BY MOUTH 2 (TWO) TIMES DAILY. (Patient taking differently: Take 20 mg by mouth 2 (two) times daily as needed (chest pain/acid reflux). )  . spironolactone (ALDACTONE) 25 MG tablet TAKE 1 TABLET (25 MG TOTAL) BY MOUTH DAILY. PLEASE KEEP 01/11/17 APPT FOR FURHTER REFILLS (Patient taking differently: Take 25 mg by mouth daily. )  . traMADol (ULTRAM) 50 MG tablet Take 50 mg by mouth daily as needed (knee pain). Reported on 07/23/2015  . triamcinolone cream (KENALOG) 0.1 % 1 APPLICATION TO AFFECTED AREA TWICE A DAY AS NEEDED FOR RED SPOTS ON BACK  . Beclomethasone Dipropionate (QNASL) 80 MCG/ACT AERS ONE SPRAY EACH NOSTRIL TWICE DAILY FOR STUFFY NOSE OR DRAINAGE. (Patient not taking: Reported on 07/01/2017)    FAMILY HISTORY:  Her indicated that her mother is deceased. She indicated that her father is deceased. She indicated that her sister is alive. She indicated that only one of her two sons is alive. She indicated that the status of her neg hx is unknown.   SOCIAL HISTORY: She  reports that she quit smoking about 39 years ago. she has never used smokeless tobacco. She reports that she drinks alcohol. She reports that she does not use drugs.  REVIEW OF SYSTEMS:   12 point ROS negative except  above.  SUBJECTIVE:  Underwent cardiac cath today > no CAD, no intervention required.   VITAL SIGNS: BP 140/83 (BP Location: Left Arm)   Pulse 93   Temp 97.8 F (36.6 C) (Oral)   Resp (!) 24   Ht 5\' 1"  (1.549 m)   Wt 52 kg (114 lb 11.2 oz)   SpO2 98%   BMI 21.67 kg/m   INTAKE / OUTPUT: I/O last 3 completed shifts: In: 1180 [P.O.:1080; IV Piggyback:100] Out: -   PHYSICAL EXAMINATION:  General - pleasant Eyes - pupils reactive ENT - no sinus tenderness, no oral exudate, no LAN, hoarse voice Cardiac - regular, no murmur Chest - decreased BS Lt upper lung field Abd - soft, non tender Ext - no edema Skin - no rashes Neuro - normal strength Psych - normal mood   LABS:  BMET Recent Labs  Lab 07/04/17 0405 07/05/17 0346 07/06/17 0409  NA 136 135 135  K 3.6 3.8 3.5  CL 104 105 105  CO2  23 19* 21*  BUN 7 8 11   CREATININE 0.65 0.72 0.65  GLUCOSE 109* 113* 98    Electrolytes Recent Labs  Lab 07/04/17 0405 07/05/17 0346 07/06/17 0409  CALCIUM 9.5 9.8 9.6    CBC Recent Labs  Lab 07/05/17 0346 07/06/17 0409 07/07/17 0939  WBC 15.8* 18.8* 21.8*  HGB 13.4 12.3 12.6  HCT 39.9 37.3 38.5  PLT 315 272 289    Coag's Recent Labs  Lab 07/04/17 0405  INR 1.07    Sepsis Markers Recent Labs  Lab 07/01/17 1951  PROCALCITON 0.50    ABG No results for input(s): PHART, PCO2ART, PO2ART in the last 168 hours.  Liver Enzymes No results for input(s): AST, ALT, ALKPHOS, BILITOT, ALBUMIN in the last 168 hours.  Cardiac Enzymes Recent Labs  Lab 07/03/17 0745 07/03/17 1437 07/03/17 1814  TROPONINI 0.07* 0.05* 0.06*    Glucose No results for input(s): GLUCAP in the last 168 hours.  Imaging No results found.  DISCUSSION: 77 yo female with dysphonia from Lt vocal fold paralysis admitted with chest pain and found to have Lt upper lung mass with mediastinal and hilar adenopathy.  This is most concerning for primary lung cancer.    ASSESSMENT /  PLAN:  Chest pain. Suspect due to her mass, atelectasis  Left upper lung mass with hilar and mediastinal adenopathy. Tissue dx shows adenoCA. Discussed next steps including staging w/u, referral for rx. She DOES NOT want to go to the [facility]. They are deciding where they would like to be referred, will let us know in near future. We will need to arrange for an outpt PET scan.   Baltazar Apo, MD, PhD 07/07/2017, 4:14 PM [facility] [phone] or if no answer [phone]

## 2017-07-07 NOTE — Progress Notes (Signed)
PROGRESS NOTE    Amanda Jackson  [id number] DOB: 11-12-1940 DOA: 07/01/2017 PCP: Midge Minium, MD   Brief Narrative: Amanda Jackson is a 77 y.o. female with medical history significant of hypertension, vocal fold paralysis/hoarseness, allergic rhinitis, anxiety, GERD. She presented with chest pain and syncope. ACS workup significant for elevated troponin. Cardiology consulted. Incidentally, she was found to have a lung mass concerning for malignancy. Pulmonology consulted and will follow-up as an outpatient.  Assessment & Plan:   Principal Problem:   Chest pain Active Problems:   Essential hypertension   Anxiety   GERD (gastroesophageal reflux disease)   Syncope   Hoarseness of voice   Mass of left lung   NSTEMI (non-ST elevated myocardial infarction) (HCC)   Atypical chest pain Heart score of 5. Chest pain recurrent. Possibly GI related as patient has history of GERD. EKG unremarkable. Troponin elevated and trended down. Echocardiogram without significant structural/functional abnormalities. Cath performed on 3/11 without significant vessel disease. -Cardiology on board and reports that chest pain most likely related to new lung mass. At this point no further cardiac evaluation recommended.  Mass-like consolidation Concerning for cancer. Patient with mildly elevated WBC with chills. Could possibly be an abscess but clinical suspicion is very low. Cough is chronic and non-productive. WBC resolved without antibiotics. -Pulmonology recommendations: endobronchial biopsy   SOB - pt reports increased sob above baseline. Will place on steroids.  Syncope Likely vasovagal vs secondary to nitroglycerin. Loss of consciousness without head injury.  Essential hypertension Initially better controlled. Patient also with more tachycardia. Very anxious. - Continue amlodipine, losartan, spironolactone, Coreg  Anxiety Slightly worsened. Required ativan IV for cath. Some  symptoms today -Continue Ativan BID  GERD Patient takes omeprazole as an outpatient -Protonix BID  Persistent cough Allergies Follows with allergist. -Continue Singulair, Pulmicort, Flonase -Albuterol prn  Hoarseness Patient with diagnosis of vocal fold paralysis. Sees ENT as an outpatient.  Leukocytosis - covering with antibiotics at this point. Also will be higher after steroid regimen.  DVT prophylaxis: Heparin gtt Code Status:   Code Status: Full Code Family Communication: None at bedside Disposition Plan: Discharge pending cardiology   Consultants:   Cardiology  Pulmonology  Procedures:   Transthoracic Echocardiogram (07/02/2017) Study Conclusions  - Left ventricle: The cavity size was normal. Wall thickness was   normal. Systolic function was normal. The estimated ejection   fraction was in the range of 55% to 60%. Wall motion was normal;   there were no regional wall motion abnormalities. Doppler   parameters are consistent with abnormal left ventricular   relaxation (grade 1 diastolic dysfunction). - Pulmonary arteries: Systolic pressure was mildly increased. PA   peak pressure: 32 mm Hg (S).  Antimicrobials:  None    Subjective: Pt has no new complaints currently but sob is above baseline.  Objective: Vitals:   07/07/17 0430 07/07/17 0800 07/07/17 0842 07/07/17 0907  BP: 136/76 125/82 125/82   Pulse: 98 98 (!) 105   Resp: 16 (!) 24    Temp:  (!) 97.5 F (36.4 C)    TempSrc:  Oral    SpO2: 100% 96%  96%  Weight: 52 kg (114 lb 11.2 oz)     Height:        Intake/Output Summary (Last 24 hours) at 07/07/2017 1453 Last data filed at 07/07/2017 1045 Gross per 24 hour  Intake 660 ml  Output -  Net 660 ml   Filed Weights   07/05/17 1314 07/06/17 0455 07/07/17 0430  Weight: 52.2 kg (115 lb) 52.3 kg (115 lb 3.2 oz) 52 kg (114 lb 11.2 oz)    Examination:  General exam: Appears calm and comfortable, in nad. Respiratory: Speaks in complete  sentences. Equal chest rise, decreased breath sounds over right lung field. Cardiovascular system: Regular rate and rhythm. Normal S1 and S2. No heart murmurs present. No extra heart sounds Gastrointestinal system: Abdomen is nondistended, soft and nontender. No organomegaly or masses felt. Normal bowel sounds heard. Central nervous system: Alert and oriented. No focal neurological deficits. Extremities: No edema. No calf tenderness Skin: No cyanosis. No rashes Psychiatry: Judgement and insight appear normal. Anxious    Data Reviewed: I have personally reviewed following labs and imaging studies  CBC: Recent Labs  Lab 07/02/17 0657 07/04/17 0405 07/05/17 0346 07/06/17 0409 07/07/17 0939  WBC 9.8 15.9* 15.8* 18.8* 21.8*  HGB 12.7 12.9 13.4 12.3 12.6  HCT 38.5 39.4 39.9 37.3 38.5  MCV 94.1 93.8 93.9 94.0 93.4  PLT 299 302 315 272 950   Basic Metabolic Panel: Recent Labs  Lab 07/02/17 0657 07/03/17 1437 07/04/17 0405 07/05/17 0346 07/06/17 0409  NA 137 132* 136 135 135  K 3.7 3.9 3.6 3.8 3.5  CL 109 103 104 105 105  CO2 18* 21* 23 19* 21*  GLUCOSE 113* 105* 109* 113* 98  BUN 12 6 7 8 11   CREATININE 0.62 0.93 0.65 0.72 0.65  CALCIUM 9.2 9.2 9.5 9.8 9.6   GFR: Estimated Creatinine Clearance: 45.1 mL/min (by C-G formula based on SCr of 0.65 mg/dL). Liver Function Tests: No results for input(s): AST, ALT, ALKPHOS, BILITOT, PROT, ALBUMIN in the last 168 hours. No results for input(s): LIPASE, AMYLASE in the last 168 hours. No results for input(s): AMMONIA in the last 168 hours. Coagulation Profile: Recent Labs  Lab 07/04/17 0405  INR 1.07   Cardiac Enzymes: Recent Labs  Lab 07/02/17 0209 07/02/17 0657 07/03/17 0745 07/03/17 1437 07/03/17 1814  TROPONINI 0.15* 0.12* 0.07* 0.05* 0.06*   BNP (last 3 results) No results for input(s): PROBNP in the last 8760 hours. HbA1C: No results for input(s): HGBA1C in the last 72 hours. CBG: No results for input(s):  GLUCAP in the last 168 hours. Lipid Profile: No results for input(s): CHOL, HDL, LDLCALC, TRIG, CHOLHDL, LDLDIRECT in the last 72 hours. Thyroid Function Tests: No results for input(s): TSH, T4TOTAL, FREET4, T3FREE, THYROIDAB in the last 72 hours. Anemia Panel: No results for input(s): VITAMINB12, FOLATE, FERRITIN, TIBC, IRON, RETICCTPCT in the last 72 hours. Sepsis Labs: Recent Labs  Lab 07/01/17 1951  PROCALCITON 0.50    No results found for this or any previous visit (from the past 240 hour(s)).       Radiology Studies: Dg Chest Port 1 View  Result Date: 07/06/2017 CLINICAL DATA:  Hypoxia EXAM: PORTABLE CHEST 1 VIEW COMPARISON:  Chest CT July 01, 2017 and chest radiograph July 05, 2017 FINDINGS: There is consolidation with volume loss in the left upper lobe. Suspect underlying mass in this area. Right lung is mildly hyperexpanded but clear. Heart size within normal limits. Pulmonary vascularity within normal limits. Areas of adenopathy seen on CT are less well seen on this radiographic examination. IMPRESSION: Consolidation with apparent mass left upper lobe. There is volume loss on the left. Right lung clear. Stable cardiac silhouette. Electronically Signed   By: Lowella Grip III M.D.   On: 07/06/2017 08:19        Scheduled Meds: . amLODipine  5 mg Oral Daily  .  atorvastatin  40 mg Oral q1800  . budesonide  0.5 mg Nebulization BID  . carvedilol  25 mg Oral BID WC  . folic acid  1 mg Oral Daily  . LORazepam  0.5 mg Intravenous Once  . LORazepam  1 mg Oral BID  . losartan  50 mg Oral Daily  . methylPREDNISolone (SOLU-MEDROL) injection  40 mg Intravenous Q12H  . montelukast  10 mg Oral QHS  . pantoprazole  40 mg Oral Daily  . potassium chloride SA  20 mEq Oral Daily  . sodium chloride flush  3 mL Intravenous Q12H  . spironolactone  25 mg Oral Daily  . vitamin B-12  2,500 mcg Oral Daily   Continuous Infusions: . sodium chloride    . levofloxacin (LEVAQUIN) IV  Stopped (07/07/17 1045)     LOS: 2 days     Velvet Bathe, MD [facility] 07/07/2017, 2:53 PM Pager: [phone]  If 7PM-7AM, please contact night-coverage [email or web address] Password [facility] 07/07/2017, 2:53 PM

## 2017-07-08 ENCOUNTER — Telehealth: Payer: Self-pay | Admitting: Emergency Medicine

## 2017-07-08 DIAGNOSIS — R0602 Shortness of breath: Secondary | ICD-10-CM

## 2017-07-08 DIAGNOSIS — C3432 Malignant neoplasm of lower lobe, left bronchus or lung: Secondary | ICD-10-CM

## 2017-07-08 LAB — CBC
HEMATOCRIT: 35.1 % — AB (ref 36.0–46.0)
Hemoglobin: 11.7 g/dL — ABNORMAL LOW (ref 12.0–15.0)
MCH: 30.7 pg (ref 26.0–34.0)
MCHC: 33.3 g/dL (ref 30.0–36.0)
MCV: 92.1 fL (ref 78.0–100.0)
Platelets: 283 10*3/uL (ref 150–400)
RBC: 3.81 MIL/uL — AB (ref 3.87–5.11)
RDW: 13.2 % (ref 11.5–15.5)
WBC: 20.3 10*3/uL — AB (ref 4.0–10.5)

## 2017-07-08 MED ORDER — ATORVASTATIN CALCIUM 40 MG PO TABS: 40.0000 mg | ORAL_TABLET | Freq: Every day | ORAL | 0 refills | Status: DC

## 2017-07-08 MED ORDER — CEFPODOXIME PROXETIL 200 MG PO TABS: 200.0000 mg | ORAL_TABLET | Freq: Two times a day (BID) | ORAL | Status: DC

## 2017-07-08 MED ORDER — CEFPODOXIME PROXETIL 200 MG PO TABS: 200.0000 mg | ORAL_TABLET | Freq: Two times a day (BID) | ORAL | 0 refills | Status: AC

## 2017-07-08 MED ORDER — AZITHROMYCIN 250 MG PO TABS: 500.0000 mg | ORAL_TABLET | Freq: Once | ORAL | Status: DC

## 2017-07-08 MED ORDER — ENOXAPARIN SODIUM 30 MG/0.3ML ~~LOC~~ SOLN: 30.0000 mg | SUBCUTANEOUS | Status: DC

## 2017-07-08 MED ORDER — AZITHROMYCIN 250 MG PO TABS: 250.0000 mg | ORAL_TABLET | Freq: Every day | ORAL | 0 refills | Status: AC

## 2017-07-08 MED ORDER — AZITHROMYCIN 250 MG PO TABS: 250.0000 mg | ORAL_TABLET | Freq: Every day | ORAL | Status: DC

## 2017-07-08 NOTE — Telephone Encounter (Signed)
Being discharged today.   We need to order a PET scan next week for her at [facility] order while she is an inpatient. Dx is adenoCA of the left upper lobe.   Thanks.

## 2017-07-08 NOTE — Progress Notes (Signed)
PULMONARY / CRITICAL CARE MEDICINE   Name: Amanda Jackson MRN: [id number] DOB: [birth year]    ADMISSION DATE:  07/01/2017 CONSULTATION DATE:  07/02/2017  REFERRING MD:  Dr. Lonny Prude, [facility]  CHIEF COMPLAINT:  Chest pain  HISTORY OF PRESENT ILLNESS:   77 yo female former smoker presented with chest pain.  As part of her evaluation she had chest xray that showed a left suprahilar density.  She then had CT chest that showed a mass like consolidation in Lt upper lobe and mediastinal and Lt hilar adenopathy.  She is noted to have dysphonia since November 2018.  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  She was advised to have a CT neck to further assess, but wasn't able to get this scheduled.  She hasn't had a recent chest xray before this admission.  She is followed by allergist for asthma.  She has dry cough.  She denies hemoptysis.  Her weight has been steady.  She gets sweats at night.  She is from [place].  She quit smoking years ago.  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  Denies history of PNA or TB.  No animal/bird exposures.    SUBJECTIVE:  She indicates that she wants all future eval including PET scan to be done at [facility].  Did not desat w ambulation.   VITAL SIGNS: BP 128/84   Pulse (!) 106   Temp 97.7 F (36.5 C) (Oral)   Resp 16   Ht 5\' 1"  (1.549 m)   Wt 52 kg (114 lb 11.2 oz)   SpO2 97%   BMI 21.67 kg/m   INTAKE / OUTPUT: I/O last 3 completed shifts: In: 560 [P.O.:460; IV Piggyback:100] Out: -   PHYSICAL EXAMINATION:  General - pleasant Eyes - pupils reactive ENT - no sinus tenderness, no oral exudate, no LAN, hoarse voice Cardiac - regular, no murmur Chest - decreased BS Lt upper lung field Abd - soft, non tender Ext - no edema Skin - no rashes Neuro - normal strength Psych - normal mood   LABS:  BMET Recent Labs  Lab 07/04/17 0405 07/05/17 0346 07/06/17 0409  NA 136 135 135  K 3.6 3.8 3.5  CL 104 105 105  CO2 23 19* 21*   BUN 7 8 11   CREATININE 0.65 0.72 0.65  GLUCOSE 109* 113* 98    Electrolytes Recent Labs  Lab 07/04/17 0405 07/05/17 0346 07/06/17 0409  CALCIUM 9.5 9.8 9.6    CBC Recent Labs  Lab 07/06/17 0409 07/07/17 0939 07/08/17 0936  WBC 18.8* 21.8* 20.3*  HGB 12.3 12.6 11.7*  HCT 37.3 38.5 35.1*  PLT 272 289 283    Coag's Recent Labs  Lab 07/04/17 0405  INR 1.07    Sepsis Markers Recent Labs  Lab 07/01/17 1951  PROCALCITON 0.50    ABG No results for input(s): PHART, PCO2ART, PO2ART in the last 168 hours.  Liver Enzymes No results for input(s): AST, ALT, ALKPHOS, BILITOT, ALBUMIN in the last 168 hours.  Cardiac Enzymes Recent Labs  Lab 07/03/17 0745 07/03/17 1437 07/03/17 1814  TROPONINI 0.07* 0.05* 0.06*    Glucose No results for input(s): GLUCAP in the last 168 hours.  Imaging No results found.  DISCUSSION: 77 yo female with dysphonia from Lt vocal fold paralysis admitted with chest pain and found to have Lt upper lung mass with mediastinal and hilar adenopathy.  This is most concerning for primary lung cancer.  ASSESSMENT / PLAN:  Chest pain. Suspect due to her mass, atelectasis  AdenoCa of the left upper lobe She wantys to get her care at [facility].  She has an OV on 3/21 with Dr Cyril Loosen  We need to set up a PET scan at [facility] if possible, can place the order next week once she is discharged.     Baltazar Apo, MD, PhD 07/08/2017, 3:37 PM [facility] [phone] or if no answer [phone]

## 2017-07-08 NOTE — Progress Notes (Signed)
SATURATION QUALIFICATIONS: (This note is used to comply with regulatory documentation for home oxygen)  Patient Saturations on Room Air at Rest = 94 %  Patient Saturations on Room Air while Ambulating = 93 %  Patient Saturations on 2 Liters of oxygen while Ambulating = 97 %  Please briefly explain why patient needs home oxygen:

## 2017-07-08 NOTE — Telephone Encounter (Signed)
Order placed per Dr. Lamonte Sakai. Nothing further needed.

## 2017-07-08 NOTE — Progress Notes (Signed)
Pt has an apponitment with [facility]  On March 21,2019. Phone number at  [phone]

## 2017-07-08 NOTE — Care Management Important Message (Signed)
Important Message  Patient Details  Name: Amanda Jackson MRN: [id number] Date of Birth: Mar 02, 1941   Medicare Important Message Given:  Yes    Barb Merino Jolayne Branson 07/08/2017, 3:40 PM

## 2017-07-08 NOTE — Discharge Summary (Signed)
Physician Discharge Summary  Amanda Jackson [id number] DOB: Nov 09, 1940 DOA: 07/01/2017  PCP: Midge Minium, MD  Admit date: 07/01/2017 Discharge date: 07/08/2017  Time spent: > 35 minutes  Recommendations for Outpatient Follow-up:  1. Ensure patient has f/u with oncologist at [facility] 2. Also ensure patient gets PET scan   Discharge Diagnoses:  Principal Problem:   Chest pain Active Problems:   Essential hypertension   Anxiety   GERD (gastroesophageal reflux disease)   Syncope   Hoarseness of voice   Mass of left lung   NSTEMI (non-ST elevated myocardial infarction) [facility])   Discharge Condition: stable  Diet recommendation: heart healthy  Filed Weights   07/05/17 1314 07/06/17 0455 07/07/17 0430  Weight: 52.2 kg (115 lb) 52.3 kg (115 lb 3.2 oz) 52 kg (114 lb 11.2 oz)    History of present illness:  77 y.o. femalewith medical history significant ofhypertension, vocal fold paralysis/hoarseness, allergic rhinitis, anxiety, GERD. She presented with chest pain and syncope. ACS workup significant for elevated troponin. Cardiology consulted. Incidentally, she was found to have a lung mass concerning for malignancy. Pulmonology consulted and pt is s/p bronchoscopy with bx that showed adenocarcinoma.  Hospital Course:  Adenocarcinoma of lung - plan is for pt to f/u with oncologist of her choosing (she didn't want someone here in [place]) - PET scan as outpatient pulmonology to assist with this.  Atypical chest pain Heart score of 5. Chest pain recurrent. Possibly GI related as patient has history of GERD. EKG unremarkable. Troponin elevated and trended down. Echocardiogram without significant structural/functional abnormalities. Cath performed on 3/11 without significant vessel disease. -Cardiology on board and reports that chest pain most likely related to new lung mass. At this point no further cardiac evaluation recommended.  Leukocytosis  - could be secondary to  oncological problem. No fevers or chills or sources of infection identified. Pt denies any productive cough of sputum  Otherwise continue prior to admission medication problems for known medical issues.   Procedures:  Lung biopsy  Consultations:  Pulmonology  cardiology  Discharge Exam: Vitals:   07/08/17 0838 07/08/17 1300  BP: 126/81 128/84  Pulse: (!) 106 (!) 106  Resp:  16  Temp:  97.7 F (36.5 C)  SpO2:  97%    General: Pt in nad, alert and awake Cardiovascular: rrr, no rubs Respiratory: no increased wob, no wheezes  Discharge Instructions   Discharge Instructions    Call MD for:  severe uncontrolled pain   Complete by:  As directed    Call MD for:  temperature >100.4   Complete by:  As directed    Diet - low sodium heart healthy   Complete by:  As directed    Increase activity slowly   Complete by:  As directed      Allergies as of 07/08/2017      Reactions   Tiotropium Swelling   Facial and lip swelling,       Medication List    STOP taking these medications   aspirin 81 MG EC tablet   budesonide 0.5 MG/2ML nebulizer solution Commonly known as:  PULMICORT   FOLBIC 2.5-25-2 MG Tabs tablet Generic drug:  folic acid-pyridoxine-cyancobalamin   ibuprofen 800 MG tablet Commonly known as:  ADVIL,MOTRIN     TAKE these medications   albuterol 108 (90 Base) MCG/ACT inhaler Commonly known as:  PROVENTIL HFA;VENTOLIN HFA INHALE TWO PUFFS EVERY 4 HOURS AS NEEDED FOR COUGH OR WHEEZE.   amLODipine 5 MG tablet Commonly known as:  NORVASC Take 1 tablet (5 mg total) by mouth daily.   atorvastatin 40 MG tablet Commonly known as:  LIPITOR Take 1 tablet (40 mg total) by mouth daily at 6 PM.   azithromycin 250 MG tablet Commonly known as:  ZITHROMAX Take 1 tablet (250 mg total) by mouth daily for 4 days. Take one tab orally daily Start taking on:  07/09/2017   Beclomethasone Dipropionate 80 MCG/ACT Aers Commonly known as:  QNASL ONE SPRAY EACH  NOSTRIL TWICE DAILY FOR STUFFY NOSE OR DRAINAGE.   CALCIUM 600+D3 PO Take 600 mg by mouth 2 (two) times daily.   carvedilol 12.5 MG tablet Commonly known as:  COREG TAKE ONE TABLET BY MOUTH TWICE DAILY WITH A MEAL What changed:    how much to take  how to take this  when to take this   cefpodoxime 200 MG tablet Commonly known as:  VANTIN Take 1 tablet (200 mg total) by mouth every 12 (twelve) hours for 4 days. Start taking on:  07/09/2017   fluticasone 220 MCG/ACT inhaler Commonly known as:  FLOVENT HFA Inhale 2 puffs 2 (two) times daily into the lungs. What changed:  how much to take   KLOR-CON M20 20 MEQ tablet Generic drug:  potassium chloride SA TAKE 1 TABLET BY MOUTH EVERY DAY What changed:    how much to take  how to take this  when to take this   LORazepam 1 MG tablet Commonly known as:  ATIVAN TAKE 1 TABLET BY MOUTH TWICE A DAY What changed:    how much to take  how to take this  when to take this   losartan 50 MG tablet Commonly known as:  COZAAR TAKE 1 TABLET BY MOUTH EVERY DAY What changed:    how much to take  how to take this  when to take this   montelukast 10 MG tablet Commonly known as:  SINGULAIR TAKE 1 TABLET BY MOUTH EVERY DAY What changed:    how much to take  how to take this  when to take this   multivitamin with minerals Tabs tablet Take 1 tablet by mouth daily. Centrum   nitroGLYCERIN 0.4 MG SL tablet Commonly known as:  NITROSTAT Place 1 tablet (0.4 mg total) under the tongue every 5 (five) minutes as needed. For chest pain (patient unsure of MAX doses allowed) What changed:    reasons to take this  additional instructions   omeprazole 20 MG capsule Commonly known as:  PRILOSEC TAKE 1 CAPSULE (20 MG TOTAL) BY MOUTH 2 (TWO) TIMES DAILY. What changed:    how much to take  how to take this  when to take this  reasons to take this  additional instructions   spironolactone 25 MG tablet Commonly known  as:  ALDACTONE TAKE 1 TABLET (25 MG TOTAL) BY MOUTH DAILY. PLEASE KEEP 01/11/17 APPT FOR FURHTER REFILLS What changed:  additional instructions   traMADol 50 MG tablet Commonly known as:  ULTRAM Take 50 mg by mouth daily as needed (knee pain). Reported on 07/23/2015   triamcinolone cream 0.1 % Commonly known as:  KENALOG 1 APPLICATION TO AFFECTED AREA TWICE A DAY AS NEEDED FOR RED SPOTS ON BACK   TYLENOL ARTHRITIS PAIN 650 MG CR tablet Generic drug:  acetaminophen Take 650 mg by mouth every 8 (eight) hours as needed for pain.   Vitamin B-12 2500 MCG Subl Place 2,500 mcg under the tongue daily.  Durable Medical Equipment  (From admission, onward)        Start     Ordered   07/08/17 1527  DME Oxygen  Once    Question Answer Comment  Mode or (Route) Nasal cannula   Liters per Minute 2   Oxygen delivery system Gas      07/08/17 1526     Allergies  Allergen Reactions  . Tiotropium Swelling    Facial and lip swelling,    Follow-up Information    Midge Minium, MD. Schedule an appointment as soon as possible for a visit in 1 week(s).   Specialty:  Family Medicine Contact information: [address] [place] [address] [phone]        Chesley Mires, MD. Schedule an appointment as soon as possible for a visit in 1 week(s).   Specialty:  Pulmonary Disease Why:  Follow-up lung mass. Office will call you, but please call if you do not hear anything from them. Contact information: [address] [place] [address] [phone]            The results of significant diagnostics from this hospitalization (including imaging, microbiology, ancillary and laboratory) are listed below for reference.    Significant Diagnostic Studies: Dg Chest 2 View  Result Date: 07/01/2017 CLINICAL DATA:  Left-sided chest pain EXAM: CHEST - 2 VIEW COMPARISON:  11/15/2014 FINDINGS: Cardiac shadow is within normal limits. The aorta is unremarkable. Right  lung is clear. Left lung demonstrates fullness in the left suprahilar region extending into the upper lobe. No other focal infiltrate is seen. No sizable effusion is noted. IMPRESSION: Left suprahilar density with upper lobe consolidation. CT of the chest with contrast is recommended to rule out underlying centrally obstructing lesion. Electronically Signed   By: Inez Catalina M.D.   On: 07/01/2017 15:22   Ct Angio Chest Pe W And/or Wo Contrast  Result Date: 07/01/2017 CLINICAL DATA:  77 year old female with acute chest pain and shortness of breath for 1 day. EXAM: CT ANGIOGRAPHY CHEST WITH CONTRAST TECHNIQUE: Multidetector CT imaging of the chest was performed using the standard protocol during bolus administration of intravenous contrast. Multiplanar CT image reconstructions and MIPs were obtained to evaluate the vascular anatomy. CONTRAST:  143mL ISOVUE-370 IOPAMIDOL (ISOVUE-370) INJECTION 76% COMPARISON:  07/01/2017 radiographs, 11/21/2014 abdominal CT and other studies. FINDINGS: Cardiovascular: This is a technically satisfactory study. No pulmonary emboli are identified. There is no evidence of thoracic aortic aneurysm. UPPER limits normal heart size noted with small pericardial effusion. Mediastinum/Nodes: Ill-defined soft tissue/adenopathy throughout the LEFT mediastinum, LEFT hilum and subcarinal region noted. Visualized thyroid gland is unremarkable. Lungs/Pleura: Masslike consolidation throughout the majority of the LEFT UPPER lobe noted and highly suspicious for central malignancy with likely some degree of atelectasis. The entire mass-like consolidation and atelectasis measures 7 x 8.6 x 11.2 cm. Narrowing of the LEFT UPPER/central pulmonary arteries noted. A small LEFT pleural effusion is noted. The RIGHT lung is clear. Upper Abdomen: No acute abnormality. Multiple hepatic cysts are unchanged from 2016. No definite new hepatic abnormalities noted. Musculoskeletal: No acute bony abnormalities or  suspicious focal bony lesions. Review of the MIP images confirms the above findings. IMPRESSION: 1. Masslike consolidation with probable atelectasis of majority of the LEFT UPPER lobe highly suspicious for malignancy with some degree of atelectasis. Ill-defined soft tissue/adenopathy throughout the LEFT mediastinum, LEFT hilum and subcarinal regions. Associated small LEFT pleural effusion. Electronically Signed   By: Margarette Canada M.D.   On:  07/01/2017 17:04   Dg Chest Port 1 View  Result Date: 07/06/2017 CLINICAL DATA:  Hypoxia EXAM: PORTABLE CHEST 1 VIEW COMPARISON:  Chest CT July 01, 2017 and chest radiograph July 05, 2017 FINDINGS: There is consolidation with volume loss in the left upper lobe. Suspect underlying mass in this area. Right lung is mildly hyperexpanded but clear. Heart size within normal limits. Pulmonary vascularity within normal limits. Areas of adenopathy seen on CT are less well seen on this radiographic examination. IMPRESSION: Consolidation with apparent mass left upper lobe. There is volume loss on the left. Right lung clear. Stable cardiac silhouette. Electronically Signed   By: Lowella Grip III M.D.   On: 07/06/2017 08:19   Dg Chest Port 1 View  Result Date: 07/05/2017 CLINICAL DATA:  Dyspnea EXAM: PORTABLE CHEST 1 VIEW COMPARISON:  CT chest 07/01/2017 FINDINGS: Persistent masslike consolidation of left upper lobe with volume loss. Small left pleural effusion. Right lung is clear. No pneumothorax. Stable heart size. No acute osseous abnormality. IMPRESSION: Persistent masslike consolidation of the left upper lobe with volume loss and a small left pleural effusion. Electronically Signed   By: Kathreen Devoid   On: 07/05/2017 09:42    Microbiology: No results found for this or any previous visit (from the past 240 hour(s)).   Labs: Basic Metabolic Panel: Recent Labs  Lab 07/02/17 0657 07/03/17 1437 07/04/17 0405 07/05/17 0346 07/06/17 0409  NA 137 132* 136 135 135  K  3.7 3.9 3.6 3.8 3.5  CL 109 103 104 105 105  CO2 18* 21* 23 19* 21*  GLUCOSE 113* 105* 109* 113* 98  BUN 12 6 7 8 11   CREATININE 0.62 0.93 0.65 0.72 0.65  CALCIUM 9.2 9.2 9.5 9.8 9.6   Liver Function Tests: No results for input(s): AST, ALT, ALKPHOS, BILITOT, PROT, ALBUMIN in the last 168 hours. No results for input(s): LIPASE, AMYLASE in the last 168 hours. No results for input(s): AMMONIA in the last 168 hours. CBC: Recent Labs  Lab 07/04/17 0405 07/05/17 0346 07/06/17 0409 07/07/17 0939 07/08/17 0936  WBC 15.9* 15.8* 18.8* 21.8* 20.3*  HGB 12.9 13.4 12.3 12.6 11.7*  HCT 39.4 39.9 37.3 38.5 35.1*  MCV 93.8 93.9 94.0 93.4 92.1  PLT 302 315 272 289 283   Cardiac Enzymes: Recent Labs  Lab 07/02/17 0209 07/02/17 0657 07/03/17 0745 07/03/17 1437 07/03/17 1814  TROPONINI 0.15* 0.12* 0.07* 0.05* 0.06*   BNP: BNP (last 3 results) No results for input(s): BNP in the last 8760 hours.  ProBNP (last 3 results) No results for input(s): PROBNP in the last 8760 hours.  CBG: No results for input(s): GLUCAP in the last 168 hours.  Signed:  Velvet Bathe MD.  [facility] 07/08/2017, 3:28 PM

## 2017-07-08 NOTE — Plan of Care (Signed)
  Progressing Clinical Measurements: Ability to maintain clinical measurements within normal limits will improve 07/08/2017 0001 - Progressing by Colonel Bald, RN Note VSS Cardiovascular complication will be avoided 07/08/2017 0001 - Progressing by Colonel Bald, RN Note No s/s of cardiovascular complications noted. Activity: Risk for activity intolerance will decrease 07/08/2017 0001 - Progressing by Colonel Bald, RN Note Ambulates to bathroom and in room without difficulty.

## 2017-07-10 ENCOUNTER — Other Ambulatory Visit: Payer: Self-pay | Admitting: Family Medicine

## 2017-07-11 ENCOUNTER — Telehealth: Payer: Self-pay

## 2017-07-11 NOTE — Telephone Encounter (Signed)
Transition Care Management Follow-up Telephone Call  Admit date: 07/01/2017 Discharge date: 07/08/2017 Dx: chest pain/mass in lung     How have you been since you were released from the hospital? "I'm okay, just tired"   Do you understand why you were in the hospital? yes, "they found a mass in my lung"   Do you understand the discharge instructions? yes   Where were you discharged to? Home. Lives with companion.    Items Reviewed:  Medications reviewed: yes  Allergies reviewed: yes  Dietary changes reviewed: yes  Referrals reviewed: yes, F/U with cardiology and oncology this week.    Functional Questionnaire:   Activities of Daily Living (ADLs):   She states they are independent in the following: ambulation, bathing and hygiene, feeding, continence, grooming, toileting and dressing States they require assistance with the following: None.    Any transportation issues/concerns?: no   Any patient concerns? no   Confirmed importance and date/time of follow-up visits scheduled yes  Provider Appointment booked with 07/18/17 @ 11am.   Confirmed with patient if condition begins to worsen call PCP or go to the ER.  Patient was given the office number and encouraged to call back with question or concerns.  : yes

## 2017-07-11 NOTE — Telephone Encounter (Signed)
LM requesting call back to complete TCM and schedule hospital f/u.  

## 2017-07-11 NOTE — Telephone Encounter (Signed)
Last OV 02/03/17 Lorazepam last filled 06/07/17 #60 with 1

## 2017-07-14 ENCOUNTER — Ambulatory Visit: Payer: Medicare Other | Admitting: Cardiology

## 2017-07-15 ENCOUNTER — Other Ambulatory Visit: Payer: Self-pay | Admitting: Allergy and Immunology

## 2017-07-18 ENCOUNTER — Other Ambulatory Visit: Payer: Self-pay

## 2017-07-18 ENCOUNTER — Encounter: Payer: Self-pay | Admitting: Family Medicine

## 2017-07-18 ENCOUNTER — Ambulatory Visit: Payer: Medicare Other | Admitting: Family Medicine

## 2017-07-18 VITALS — BP 94/62 | HR 108 | Temp 97.9°F | Resp 17 | Ht 61.0 in | Wt 111.1 lb

## 2017-07-18 DIAGNOSIS — I10 Essential (primary) hypertension: Secondary | ICD-10-CM | POA: Diagnosis not present

## 2017-07-18 DIAGNOSIS — C3492 Malignant neoplasm of unspecified part of left bronchus or lung: Secondary | ICD-10-CM | POA: Diagnosis not present

## 2017-07-18 DIAGNOSIS — C3490 Malignant neoplasm of unspecified part of unspecified bronchus or lung: Secondary | ICD-10-CM | POA: Insufficient documentation

## 2017-07-18 NOTE — Patient Instructions (Signed)
I sent a message to Dr Quay Burow and I will get you scheduled with her as long as she's accepting new patients Your blood pressure is running low- decrease Amlodipine to 1/2 tab daily Call with any questions or concerns KEEP FIGHTING!!!

## 2017-07-18 NOTE — Assessment & Plan Note (Signed)
Chronic problem.  Pt is mildly hypotensive today.  Based on this, will decrease Amlodipine to 2.5mg  daily.  Pt expressed understanding and is in agreement w/ plan.

## 2017-07-18 NOTE — Assessment & Plan Note (Signed)
New.  Pt was dx'd during her hospitalization and it is thought that this was the cause of her SOB and CP.  She has since seen Onc at [facility] and had a PET scan.  She has procedures upcoming later this week- including colonoscopy for questionable area in proximal sigmoid.  Pt has a fighting spirit and is determined to 'beat this'.

## 2017-07-18 NOTE — Progress Notes (Signed)
   Subjective:    Patient ID: Amanda Jackson, female    DOB: 09-04-1940, 77 y.o.   MRN: [id number]  HPI Hospital F/U- pt was admitted 3/8-15 w/ CP and SOB.  She was found to have spiculated mass in LUL that was confirmed w/ bx to be adenocarcinoma.  She had a normal heart cath on 3/11 and they felt the CP was related to her new lung cancer.  She had a PET scan at [facility] on 3/21 that showed L hilar LAD, bilateral mediastinal and supraclavicular node, L axillar LAD, and ? Uptake in proximal sigmoid.  Colonoscopy recommended and pending for later this week.  Now seeing Dr Fatima Sanger (onc) at [facility] has lost 9 lbs since last visit.   Review of Systems For ROS see HPI     Objective:   Physical Exam  Constitutional: She is oriented to person, place, and time.  Thin, frail, elderly woman  HENT:  Head: Normocephalic and atraumatic.  Hoarseness worse than previous  Pulmonary/Chest:  Hacking cough  Neurological: She is alert and oriented to person, place, and time.  Skin: Skin is warm and dry.  Psychiatric: She has a normal mood and affect. Her behavior is normal. Thought content normal.  Vitals reviewed.         Assessment & Plan:

## 2017-07-21 ENCOUNTER — Telehealth: Payer: Self-pay | Admitting: Internal Medicine

## 2017-07-21 NOTE — Telephone Encounter (Signed)
Left patient vm to call back to set up transfer appointment.

## 2017-07-21 NOTE — Telephone Encounter (Signed)
°  °   °  Previous Messages    ----- Message -----  From: Rosebud Poles  Sent: 07/20/2017  8:17 AM  To: Conception Oms  Subject: FW: pt transfer                  Is this ok for me to call pt and schedule or should I route this to [place]?   Thanks,  ----- Message -----  From: Midge Minium, MD  Sent: 07/18/2017  1:01 PM  To: Rosebud Poles  Subject: FW: pt transfer                  Can you please schedule this pt for a new pt w/ Dr Quay Burow for her next available. Pt is aware and requested to transfer due to distance.   Thanks!  KT  ----- Message -----  From: Binnie Rail, MD  Sent: 07/18/2017 12:45 PM  To: Midge Minium, MD  Subject: RE: pt transfer                  Sure, no problem, I can accept her.    Hope your day is going well.   Stacy  ----- Message -----  From: Midge Minium, MD  Sent: 07/18/2017 11:45 AM  To: Binnie Rail, MD  Subject: pt transfer                    Happy Monday!   I hate to even ask b/c I know how slammed you are, but I have a DELIGHTFUL woman who needs to transfer due to office distance. She was just diagnosed this month w/ metastatic adenocarcinoma of lung and has a whole bunch of specialists who are managing her- and I typically only see her twice yearly for BP and cholesterol. She is distraught over needing to transfer but it really is the best thing for her. And she is AMAZING! If you're willing, please let me know and I appreciate you even considering her.   Hope to see you soon!  Anda Kraft

## 2017-07-23 ENCOUNTER — Other Ambulatory Visit: Payer: Self-pay | Admitting: Allergy and Immunology

## 2017-07-23 DIAGNOSIS — J339 Nasal polyp, unspecified: Secondary | ICD-10-CM

## 2017-07-26 ENCOUNTER — Telehealth: Payer: Self-pay | Admitting: Cardiology

## 2017-07-26 ENCOUNTER — Telehealth: Payer: Self-pay | Admitting: Internal Medicine

## 2017-07-26 MED ORDER — LISINOPRIL 5 MG PO TABS: 5.0000 mg | ORAL_TABLET | Freq: Every day | ORAL | 3 refills | Status: DC

## 2017-07-26 NOTE — Telephone Encounter (Signed)
Copied from [place] [id number]. Topic: General - Other >> Jul 26, 2017 12:57 PM Cecelia Byars, NT wrote: Reason for CRM: Unable to make appointment for patient due to restrictions please call her at [phone] ,thanks please leave message she says it is ok to leave vm

## 2017-07-26 NOTE — Telephone Encounter (Signed)
Will route this message to Dr Meda Coffee to review and advise on recalled losartan.  Will follow-up with the pt accordingly thereafter.

## 2017-07-26 NOTE — Telephone Encounter (Signed)
Spoke with the pt and informed her that she should stop her losartan, and Dr Meda Coffee wants her to start taking lisinopril 5 mg po daily.  Confirmed the pharmacy of choice with the pt.  Pt verbalized understanding and agrees with this plan.

## 2017-07-26 NOTE — Telephone Encounter (Signed)
Try lisinopril 5 mg po daily

## 2017-07-26 NOTE — Telephone Encounter (Signed)
New message    Pt c/o medication issue:  1. Name of Medication:   losartan (COZAAR) 50 MG tablet TAKE 1 TABLET BY MOUTH EVERY DAY Patient taking differently: TAKE 1 TABLET (50 MG) BY MOUTH EVERY DAY        2. How are you currently taking this medication (dosage and times per day)? 1 tablet daily   3. Are you having a reaction (difficulty breathing--STAT)?  no  4. What is your medication issue? Patient calling about the recall

## 2017-07-26 NOTE — Telephone Encounter (Signed)
Routed to wrong department, please route accordingly

## 2017-07-27 LAB — HM COLONOSCOPY

## 2017-08-01 ENCOUNTER — Encounter: Payer: Self-pay | Admitting: General Practice

## 2017-08-01 ENCOUNTER — Ambulatory Visit: Payer: Medicare Other | Admitting: Family Medicine

## 2017-08-08 ENCOUNTER — Other Ambulatory Visit: Payer: Self-pay | Admitting: Family Medicine

## 2017-08-08 NOTE — Telephone Encounter (Signed)
Last OV: 07/18/2017 Fill Date: 06/07/2017

## 2017-08-15 ENCOUNTER — Other Ambulatory Visit: Payer: Self-pay | Admitting: Allergy and Immunology

## 2017-08-15 ENCOUNTER — Telehealth: Payer: Self-pay | Admitting: Allergy and Immunology

## 2017-08-15 ENCOUNTER — Ambulatory Visit: Payer: Medicare Other | Admitting: Allergy and Immunology

## 2017-08-15 NOTE — Telephone Encounter (Signed)
Patient would like Dr. Verlin Fester to authorize her to get her handicap hanging tag renewed. She said it has expired. She would like to be able to pick up the form/letter sometime tomorrow, 08-16-2017.

## 2017-08-16 NOTE — Telephone Encounter (Signed)
Called patient left message to come fill form and then she can get original. Gave form to front desk for patient to fill out and make a copy for our records

## 2017-08-16 NOTE — Telephone Encounter (Signed)
The form has been signed and returned.

## 2017-08-16 NOTE — Telephone Encounter (Signed)
Is it ok to type up a letter for her and if so what would you like it to say?

## 2017-08-16 NOTE — Telephone Encounter (Signed)
Form printed given to Dr Verlin Fester

## 2017-08-18 NOTE — Telephone Encounter (Signed)
Left message to come in to sign paper .

## 2017-08-19 ENCOUNTER — Other Ambulatory Visit: Payer: Self-pay | Admitting: Family Medicine

## 2017-08-27 ENCOUNTER — Other Ambulatory Visit: Payer: Self-pay | Admitting: Family Medicine

## 2017-08-29 NOTE — Telephone Encounter (Signed)
Patient came in to sign paperwork. Form copied patient did tell [facility] front desk she was diagnosed with lung cancer. FYI

## 2017-09-08 ENCOUNTER — Other Ambulatory Visit: Payer: Self-pay | Admitting: Allergy and Immunology

## 2017-09-22 NOTE — Progress Notes (Signed)
Subjective:    Patient ID: Amanda Jackson, female    DOB: 10-14-1940, 77 y.o.   MRN: [id number]  HPI  She is here to establish with a new pcp.  The patient is here for follow up.  Metastatic adenocarcinoma of the lung:  She is going to [facility].  She has had two treatments.  She has a cough and clear sputum.  She has occasional shortness of breath with exertion.  Overall she is positive about her treatment.  She has good social support with her partner and family.  Hypertension: She is taking her medication daily. She is compliant with a low sodium diet.  Her medication was reduced a couple of months ago because of hypotension.  She has occasional chest pain.  It is difficult for her to know if it is related to her heart, heartburn or the lung cancer.  The last time she had it she did not take additional heartburn medication or her nitroglycerin and the pain went away.  She has rare palpitations.  She denies any leg swelling or regular headaches.  She is not exercising regularly.      Hyperlipidemia: She is not currently taking her cholesterol medication . She is compliant with a low fat/cholesterol diet. She is not exercising regularly.    GERD:  She is taking her medication daily as prescribed.  She denies any GERD symptoms and feels her GERD is well controlled.   Anxiety: She is taking her medication daily as prescribed. She denies any side effects from the medication. She feels her anxiety is well controlled and she is happy with her current dose of medication.   Knee pain: She uses tramadol as needed and this seems to work well.  She has severe arthritis.  Medications and allergies reviewed with patient and updated if appropriate.  Patient Active Problem List   Diagnosis Date Noted  . Adenocarcinoma of lung ([place]) 07/18/2017  . NSTEMI (non-ST elevated myocardial infarction) ([place])   . Chest pain 07/01/2017  . 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 Vocal cord nodule  06/30/2017  . Hoarseness of voice 05/24/2017  . Migraine 01/11/2017  . Persistent cough 11/24/2015  . Vasovagal syncope 10/15/2015  . Hypokalemia 08/22/2015  . Syncope 07/30/2015  . History of nasal polyps 03/25/2015  . Abdominal pain, left lower quadrant 11/04/2014  . Leg pain, posterior 11/05/2013  . Mild persistent asthma 05/08/2013  . Loss of weight 12/05/2012  . Abnormal TSH 10/11/2012  . GERD (gastroesophageal reflux disease) 04/12/2012  . Atypical chest pain   . Allergic rhinitis   . MVP (mitral valve prolapse)   . Tubulovillous adenoma   . Hyperlipidemia 07/20/2011  . Anxiety 04/12/2011  . Hx of adenomatous colonic polyps 04/12/2011  . Hepatic cyst 04/12/2011  . History of mitral valve prolapse 04/12/2011  . Essential hypertension 01/17/2009    Current Outpatient Medications on File Prior to Visit  Medication Sig Dispense Refill  . acetaminophen (TYLENOL ARTHRITIS PAIN) 650 MG CR tablet Take 650 mg by mouth every 8 (eight) hours as needed for pain.    Marland Kitchen amLODipine (NORVASC) 5 MG tablet Take 0.5 tablets (2.5 mg total) by mouth daily. 45 tablet 1  . atorvastatin (LIPITOR) 40 MG tablet Take 1 tablet (40 mg total) by mouth daily at 6 PM. 30 tablet 0  . Beclomethasone Dipropionate (QNASL) 80 MCG/ACT AERS ONE SPRAY EACH NOSTRIL TWICE DAILY FOR STUFFY NOSE OR DRAINAGE. 1 Inhaler 4  . Calcium  Carb-Cholecalciferol (CALCIUM 600+D3 PO) Take 600 mg by mouth 2 (two) times daily.     . carvedilol (COREG) 12.5 MG tablet TAKE 1 TABLET BY MOUTH TWICE A DAY WITH A MEAL 60 tablet 6  . Cyanocobalamin (VITAMIN B-12) 2500 MCG SUBL Place 2,500 mcg under the tongue daily.     Marland Kitchen FLOVENT HFA 110 MCG/ACT inhaler TAKE 2 PUFFS BY MOUTH TWICE A DAY 1 Inhaler 2  . fluticasone (FLOVENT HFA) 220 MCG/ACT inhaler Inhale 2 puffs 2 (two) times daily into the lungs. (Patient taking differently: Inhale 3 puffs into the lungs 2 (two) times daily. ) 1 Inhaler 5  . KLOR-CON M20 20 MEQ tablet TAKE 1 TABLET BY  MOUTH EVERY DAY (Patient taking differently: TAKE 1 TABLET (20 MEQ)  BY MOUTH EVERY DAY) 90 tablet 3  . lisinopril (PRINIVIL,ZESTRIL) 5 MG tablet Take 1 tablet (5 mg total) by mouth daily. 90 tablet 3  . LORazepam (ATIVAN) 1 MG tablet TAKE 1 TABLET BY MOUTH TWICE A DAY 60 tablet 1  . montelukast (SINGULAIR) 10 MG tablet TAKE 1 TABLET BY MOUTH EVERY DAY (Patient taking differently: TAKE 1 TABLET (10 MG) BY MOUTH EVERY DAY AT BEDTIME) 30 tablet 0  . Multiple Vitamin (MULTIVITAMIN WITH MINERALS) TABS tablet Take 1 tablet by mouth daily. Centrum    . nitroGLYCERIN (NITROSTAT) 0.4 MG SL tablet Place 1 tablet (0.4 mg total) under the tongue every 5 (five) minutes as needed. For chest pain (patient unsure of MAX doses allowed) (Patient taking differently: Place 0.4 mg under the tongue every 5 (five) minutes as needed for chest pain. ) 30 tablet 1  . omeprazole (PRILOSEC) 20 MG capsule TAKE 1 CAPSULE (20 MG TOTAL) BY MOUTH 2 (TWO) TIMES DAILY. (Patient taking differently: Take 20 mg by mouth 2 (two) times daily as needed (chest pain/acid reflux). ) 180 capsule 1  . PROAIR HFA 108 (90 Base) MCG/ACT inhaler INHALE TWO PUFFS EVERY 4 HOURS AS NEEDED FOR COUGH OR WHEEZE. 18 Inhaler 1  . spironolactone (ALDACTONE) 25 MG tablet TAKE 1 TABLET (25 MG TOTAL) BY MOUTH DAILY. PLEASE KEEP 01/11/17 APPT FOR FURHTER REFILLS (Patient taking differently: Take 25 mg by mouth daily. ) 30 tablet 10  . traMADol (ULTRAM) 50 MG tablet Take 50 mg by mouth daily as needed (knee pain). Reported on 07/23/2015  0  . triamcinolone cream (KENALOG) 0.1 % 1 APPLICATION TO AFFECTED AREA TWICE A DAY AS NEEDED FOR RED SPOTS ON BACK  0   No current facility-administered medications on file prior to visit.     Past Medical History:  Diagnosis Date  . Allergy   . Anxiety   . Arthritis   . Asthma   . Atypical chest pain    Normal Myoview 2008  . Colon polyp   . GERD (gastroesophageal reflux disease)   . Hx of colonoscopy 03/18/09  .  Hyperlipidemia   . Hypertension   . Left nasal polyps   . MVP (mitral valve prolapse)   . Seasonal allergies   . Tubulovillous adenoma     Past Surgical History:  Procedure Laterality Date  . ABDOMINAL HYSTERECTOMY    . BREAST SURGERY  2010   biopsy-benign  . BUNIONECTOMY    . COLONOSCOPY    . FUNCTIONAL ENDOSCOPIC SINUS SURGERY  08/28/08   with bilateral ethmoidectomies and bilateral maxillary sinus ostial enlargement with removal of mucocyst, mucous membrane, and mucoid material  . KNEE ARTHROSCOPY W/ LATERAL RELEASE  11/17/99   Left  .  KNEE ARTHROSCOPY W/ PARTIAL MEDIAL MENISCECTOMY  11/17/99   left  . Laparoscopic Right Colectomy  01/25/03  . LEFT HEART CATH AND CORONARY ANGIOGRAPHY N/A 07/04/2017   Procedure: LEFT HEART CATH AND CORONARY ANGIOGRAPHY;  Surgeon: Leonie Man, MD;  Location: [facility];  Service: Cardiovascular;  Laterality: N/A;  . nasal polyp removal    . POLYPECTOMY    . TONSILLECTOMY  1952  . VIDEO BRONCHOSCOPY Bilateral 07/05/2017   Procedure: VIDEO BRONCHOSCOPY WITHOUT FLUORO;  Surgeon: Collene Gobble, MD;  Location: [facility] ENDOSCOPY;  Service: Cardiopulmonary;  Laterality: Bilateral;    Social History   Socioeconomic History  . Marital status: Divorced    Spouse name: Not on file  . Number of children: Not on file  . Years of education: Not on file  . Highest education level: Not on file  Occupational History  . Occupation: Retired    Comment: English Professor  Social Needs  . Financial resource strain: Not on file  . Food insecurity:    Worry: Not on file    Inability: Not on file  . Transportation needs:    Medical: Not on file    Non-medical: Not on file  Tobacco Use  . Smoking status: Former Smoker    Last attempt to quit: 09/09/1977    Years since quitting: 40.0  . Smokeless tobacco: Never Used  . Tobacco comment: used to smoke socially. Quit in 1979.  Substance and Sexual Activity  . Alcohol use: Yes    Comment: usually has a  glass of wine with dinner every evening  . Drug use: No  . Sexual activity: Not Currently    Birth control/protection: Other-see comments    Comment: HYST  Lifestyle  . Physical activity:    Days per week: Not on file    Minutes per session: Not on file  . Stress: Not on file  Relationships  . Social connections:    Talks on phone: Not on file    Gets together: Not on file    Attends religious service: Not on file    Active member of club or organization: Not on file    Attends meetings of clubs or organizations: Not on file    Relationship status: Not on file  Other Topics Concern  . Not on file  Social History Narrative  . Not on file    Family History  Problem Relation Age of Onset  . Hypertension Son   . Lung cancer Father   . Cancer Father   . Hypertension Father   . Liver cancer Mother   . Cancer Mother   . Hypertension Mother   . Colon cancer Mother   . Colon cancer Sister   . Heart murmur Son   . Stomach cancer Neg Hx   . Ulcerative colitis Neg Hx   . Allergic rhinitis Neg Hx   . Angioedema Neg Hx   . Asthma Neg Hx     Review of Systems  Constitutional: Positive for appetite change (decreased) and diaphoresis. Negative for chills and fever.  Respiratory: Positive for cough, shortness of breath (occ with exertion) and wheezing.   Cardiovascular: Positive for chest pain (occ) and palpitations (rare). Negative for leg swelling.  Gastrointestinal: Positive for constipation. Negative for abdominal pain (discomfort at times) and nausea.  Neurological: Positive for light-headedness (rare). Negative for headaches.  Psychiatric/Behavioral: The patient is nervous/anxious.        Objective:   Vitals:   09/23/17 9485  BP: 96/64  Pulse: 70  Resp: 16  Temp: 97.6 F (36.4 C)  SpO2: 94%   BP Readings from Last 3 Encounters:  09/23/17 96/64  07/18/17 94/62  07/08/17 128/84   Wt Readings from Last 3 Encounters:  09/23/17 109 lb (49.4 kg)  07/18/17 111 lb 2  oz (50.4 kg)  07/07/17 114 lb 11.2 oz (52 kg)   Body mass index is 20.6 kg/m.   Physical Exam    Constitutional: Appears well-developed and well-nourished. No distress.  HENT:  Head: Normocephalic and atraumatic.  Neck: Neck supple. No tracheal deviation present. No thyromegaly present.  No cervical lymphadenopathy Cardiovascular: Normal rate, regular rhythm and normal heart sounds.   No murmur heard. No carotid bruit .  No edema Pulmonary/Chest: Effort normal and breath sounds normal. No respiratory distress. No has no wheezes. No rales. Abdomen: Soft, nontender Skin: Skin is warm and dry. Not diaphoretic.  Psychiatric: Normal mood and affect. Behavior is normal.      Assessment & Plan:    See Problem List for Assessment and Plan of chronic medical problems.

## 2017-09-23 ENCOUNTER — Encounter: Payer: Self-pay | Admitting: Internal Medicine

## 2017-09-23 ENCOUNTER — Ambulatory Visit: Payer: Medicare Other | Admitting: Internal Medicine

## 2017-09-23 VITALS — BP 96/64 | HR 70 | Temp 97.6°F | Resp 16 | Ht 61.0 in | Wt 109.0 lb

## 2017-09-23 DIAGNOSIS — F419 Anxiety disorder, unspecified: Secondary | ICD-10-CM | POA: Diagnosis not present

## 2017-09-23 DIAGNOSIS — E876 Hypokalemia: Secondary | ICD-10-CM

## 2017-09-23 DIAGNOSIS — E782 Mixed hyperlipidemia: Secondary | ICD-10-CM

## 2017-09-23 DIAGNOSIS — M1710 Unilateral primary osteoarthritis, unspecified knee: Secondary | ICD-10-CM | POA: Insufficient documentation

## 2017-09-23 DIAGNOSIS — I10 Essential (primary) hypertension: Secondary | ICD-10-CM | POA: Diagnosis not present

## 2017-09-23 DIAGNOSIS — M1712 Unilateral primary osteoarthritis, left knee: Secondary | ICD-10-CM | POA: Diagnosis not present

## 2017-09-23 DIAGNOSIS — M179 Osteoarthritis of knee, unspecified: Secondary | ICD-10-CM | POA: Insufficient documentation

## 2017-09-23 MED ORDER — AMLODIPINE BESYLATE 2.5 MG PO TABS: 2.5000 mg | ORAL_TABLET | Freq: Every day | ORAL | 3 refills | Status: DC

## 2017-09-23 MED ORDER — OMEPRAZOLE 20 MG PO CPDR: DELAYED_RELEASE_CAPSULE | ORAL | 1 refills | Status: DC

## 2017-09-23 MED ORDER — SPIRONOLACTONE 25 MG PO TABS: 25.0000 mg | ORAL_TABLET | Freq: Every day | ORAL | 3 refills | Status: DC

## 2017-09-23 MED ORDER — SPIRONOLACTONE 25 MG PO TABS: 12.5000 mg | ORAL_TABLET | Freq: Every day | ORAL | 10 refills | Status: DC

## 2017-09-23 MED ORDER — SPIRONOLACTONE 25 MG PO TABS: 25.0000 mg | ORAL_TABLET | Freq: Every day | ORAL | 10 refills | Status: DC

## 2017-09-23 MED ORDER — CARVEDILOL 12.5 MG PO TABS: ORAL_TABLET | ORAL | 3 refills | Status: DC

## 2017-09-23 NOTE — Patient Instructions (Addendum)
  Medications reviewed and updated.  Changes include stopping the amlodipine for now due to low blood pressure.  Continue all your other medications at their current dose.    Your prescription(s) have been submitted to your pharmacy. Please take as directed and contact our office if you believe you are having problem(s) with the medication(s).    Please followup in 6 months, sooner if needed

## 2017-09-23 NOTE — Assessment & Plan Note (Signed)
Blood pressures on the low side still, but she is asymptomatic She states she did have an episode not too long ago with lightheadedness and thought she was going to faint She has high risk for syncope related to hypotension We will discontinue amlodipine-she was taking 2.5 mg daily Continue other medications at current doses Will follow-up with cardiology and they can reevaluate as well

## 2017-09-23 NOTE — Assessment & Plan Note (Signed)
Having blood work done routinely through oncology Taking one potassium pill daily Currently on lisinopril and spironolactone Continue current medication-May need to be adjusted if blood pressure continues to remain on the low side

## 2017-09-23 NOTE — Assessment & Plan Note (Signed)
Taking tramadol as needed only Okay to continue

## 2017-09-23 NOTE — Assessment & Plan Note (Signed)
Controlled, stable Continue current dose of medication  

## 2017-09-23 NOTE — Assessment & Plan Note (Addendum)
No longer taking Lipitor, unsure why She can discuss further with cardiology, but given metastatic cancer will not restart

## 2017-09-25 ENCOUNTER — Other Ambulatory Visit: Payer: Self-pay | Admitting: Allergy & Immunology

## 2017-09-27 ENCOUNTER — Encounter: Payer: Self-pay | Admitting: Allergy and Immunology

## 2017-09-27 ENCOUNTER — Ambulatory Visit: Payer: Medicare Other | Admitting: Allergy and Immunology

## 2017-09-27 VITALS — BP 90/50 | HR 80 | Temp 97.5°F | Resp 16

## 2017-09-27 DIAGNOSIS — J4530 Mild persistent asthma, uncomplicated: Secondary | ICD-10-CM | POA: Diagnosis not present

## 2017-09-27 DIAGNOSIS — J3801 Paralysis of vocal cords and larynx, unilateral: Secondary | ICD-10-CM

## 2017-09-27 DIAGNOSIS — J382 Nodules of vocal cords: Secondary | ICD-10-CM | POA: Diagnosis not present

## 2017-09-27 DIAGNOSIS — J3089 Other allergic rhinitis: Secondary | ICD-10-CM

## 2017-09-27 DIAGNOSIS — K1379 Other lesions of oral mucosa: Secondary | ICD-10-CM | POA: Diagnosis not present

## 2017-09-27 DIAGNOSIS — J339 Nasal polyp, unspecified: Secondary | ICD-10-CM

## 2017-09-27 MED ORDER — TRIAMCINOLONE ACETONIDE 0.1 % MT PSTE: 1.0000 "application " | PASTE | Freq: Two times a day (BID) | OROMUCOSAL | 4 refills | Status: AC

## 2017-09-27 MED ORDER — FLUTICASONE PROPIONATE HFA 110 MCG/ACT IN AERO: 2.0000 | INHALATION_SPRAY | Freq: Two times a day (BID) | RESPIRATORY_TRACT | 2 refills | Status: DC

## 2017-09-27 NOTE — Patient Instructions (Signed)
Mild persistent asthma  For now, continue Flovent 110 g, 2 inhalations via spacer device twice a day, and albuterol HFA every 6 hours as needed.  Subjective and objective measures of pulmonary function will be followed and the treatment plan will be adjusted accordingly.  Paralysis of left vocal fold  Follow-up with Dr. Joya Gaskins as recommended.  Allergic rhinitis  Continue appropriate allergen avoidance measures and budesonide/saline nasal irrigation 1-2 times per day.    For thick post nasal drainage, add guaifenesin 600 mg (Mucinex)  twice daily as needed with adequate hydration as discussed.  History of nasal polyps  Continue budesonide/saline rinse (as above).  Lip/mouth sore  A prescription has been provided for triamcinolone 0.1% paste to be applied to affected area twice daily as needed for the next 7-14 days.   Return in about 6 months (around 03/29/2018), or if symptoms worsen or fail to improve.

## 2017-09-27 NOTE — Assessment & Plan Note (Signed)
   Continue budesonide/saline rinse (as above).

## 2017-09-27 NOTE — Assessment & Plan Note (Signed)
   For now, continue Flovent 110 g, 2 inhalations via spacer device twice a day, and albuterol HFA every 6 hours as needed.  Subjective and objective measures of pulmonary function will be followed and the treatment plan will be adjusted accordingly.

## 2017-09-27 NOTE — Assessment & Plan Note (Signed)
   Follow-up with Dr. Joya Gaskins as recommended.

## 2017-09-27 NOTE — Progress Notes (Signed)
Follow-up Note  RE: Amanda Jackson MRN: [id number] DOB: [birth year] Date of Office Visit: 09/27/2017  Primary care provider: Binnie Rail, MD Referring provider: Midge Minium, MD  History of present illness: Amanda Jackson is a 77 y.o. female with asthma, chronic rhinitis, and vocal cord dysfunction presenting today for follow-up.  She was last seen in this clinic by Dr. Ernst Bowler on June 30, 2016.  At that time, her asthma had been relatively well controlled and her main complaint was of a sore on her lip.  She reports that she was diagnosed in March with lung cancer.  She had symptoms which were initially thought to be caused by cardiac ischemia, however during her hospitalization chest x-ray and PET scan imaging revealed left non-small cell lung adenocarcinoma.  She is undergoing chemotherapy at [facility].  She reports that her asthma has been well controlled.  She currently takes Flovent 110 g, 2 inhalations via spacer device twice daily.  She has no nasal symptom complaints today.  She is being followed by Dr. Carol Ada for vocal cord paralysis and states that her hoarseness improved to some degree after a Prolaryn Plus injection Her main complaint today is that she feels that a sore is starting to develop on her lip again and she requests a refill for triamcinolone dental paste.  Assessment and plan: Mild persistent asthma  For now, continue Flovent 110 g, 2 inhalations via spacer device twice a day, and albuterol HFA every 6 hours as needed.  Subjective and objective measures of pulmonary function will be followed and the treatment plan will be adjusted accordingly.  Paralysis of left vocal fold  Follow-up with Dr. Joya Gaskins as recommended.  Allergic rhinitis  Continue appropriate allergen avoidance measures and budesonide/saline nasal irrigation 1-2 times per day.    For thick post nasal drainage, add guaifenesin 600 mg (Mucinex)  twice daily as needed with  adequate hydration as discussed.  History of nasal polyps  Continue budesonide/saline rinse (as above).  Lip/mouth sore  A prescription has been provided for triamcinolone 0.1% paste to be applied to affected area twice daily as needed for the next 7-14 days.   Meds ordered this encounter  Medications  . fluticasone (FLOVENT HFA) 110 MCG/ACT inhaler    Sig: Inhale 2 puffs into the lungs 2 (two) times daily.    Dispense:  1 Inhaler    Refill:  2  . triamcinolone (KENALOG) 0.1 % paste    Sig: Use as directed 1 application in the mouth or throat 2 (two) times daily.    Dispense:  5 g    Refill:  4    Diagnostics: Spirometry reveals an FVC of 1.61 L and an FEV1 of 0.96 L (68% predicted) with a 79% FEV1 ratio.  The FEV1 today is improved compared with her previous study.    Physical examination: Blood pressure (!) 90/50, pulse 80, temperature (!) 97.5 F (36.4 C), temperature source Oral, resp. rate 16, SpO2 97 %.  General: Alert, interactive, in no acute distress. HEENT: TMs pearly gray, turbinates minimally edematous without discharge, post-pharynx moderately erythematous. Neck: Supple without lymphadenopathy. Lungs: Mildly decreased breath sounds with some rhonchi bilaterally CV: Normal S1, S2 without murmurs. Skin: Warm and dry, without lesions or rashes.  The following portions of the patient's history were reviewed and updated as appropriate: allergies, current medications, past family history, past medical history, past social history, past surgical history and problem list.  Allergies as of 09/27/2017  Reactions   Tiotropium Swelling   Facial and lip swelling,       Medication List        Accurate as of 09/27/17  4:57 PM. Always use your most recent med list.          carvedilol 12.5 MG tablet Commonly known as:  COREG TAKE 1 TABLET BY MOUTH TWICE A DAY WITH A MEAL   dexamethasone 4 MG tablet Commonly known as:  DECADRON Take 4 mg by mouth daily as  needed.   dronabinol 2.5 MG capsule Commonly known as:  MARINOL TAKE 1 CAPSULE BY MOUTH TWICE A DAY BEFORE MEALS   fluticasone 220 MCG/ACT inhaler Commonly known as:  FLOVENT HFA Inhale 2 puffs 2 (two) times daily into the lungs.   fluticasone 110 MCG/ACT inhaler Commonly known as:  FLOVENT HFA Inhale 2 puffs into the lungs 2 (two) times daily.   KLOR-CON M20 20 MEQ tablet Generic drug:  potassium chloride SA TAKE 1 TABLET BY MOUTH EVERY DAY   lisinopril 5 MG tablet Commonly known as:  PRINIVIL,ZESTRIL Take 1 tablet (5 mg total) by mouth daily.   LORazepam 1 MG tablet Commonly known as:  ATIVAN TAKE 1 TABLET BY MOUTH TWICE A DAY   montelukast 10 MG tablet Commonly known as:  SINGULAIR TAKE 1 TABLET BY MOUTH EVERY DAY   multivitamin with minerals Tabs tablet Take 1 tablet by mouth daily. Centrum   nitroGLYCERIN 0.4 MG SL tablet Commonly known as:  NITROSTAT Place 1 tablet (0.4 mg total) under the tongue every 5 (five) minutes as needed. For chest pain (patient unsure of MAX doses allowed)   omeprazole 20 MG capsule Commonly known as:  PRILOSEC TAKE 1 CAPSULE (20 MG TOTAL) BY MOUTH 2 (TWO) TIMES DAILY.   PROAIR HFA 108 (90 Base) MCG/ACT inhaler Generic drug:  albuterol INHALE TWO PUFFS EVERY 4 HOURS AS NEEDED FOR COUGH OR WHEEZE.   spironolactone 25 MG tablet Commonly known as:  ALDACTONE Take 1 tablet (25 mg total) by mouth daily. Please keep 01/11/17 appt for furhter refills   traMADol 50 MG tablet Commonly known as:  ULTRAM Take 50 mg by mouth daily as needed (knee pain). Reported on 07/23/2015   triamcinolone 0.1 % paste Commonly known as:  KENALOG Use as directed 1 application in the mouth or throat 2 (two) times daily.   triamcinolone cream 0.1 % Commonly known as:  KENALOG 1 APPLICATION TO AFFECTED AREA TWICE A DAY AS NEEDED FOR RED SPOTS ON BACK   TYLENOL ARTHRITIS PAIN 650 MG CR tablet Generic drug:  acetaminophen Take 650 mg by mouth every 8  (eight) hours as needed for pain.       Allergies  Allergen Reactions  . Tiotropium Swelling    Facial and lip swelling,    Review of systems: Review of systems negative except as noted in HPI / PMHx or noted below: Constitutional: Negative.  HENT: Negative.   Eyes: Negative.  Respiratory: Negative.   Cardiovascular: Negative.  Gastrointestinal: Negative.  Genitourinary: Negative.  Musculoskeletal: Negative.  Neurological: Negative.  Endo/Heme/Allergies: Negative.  Cutaneous: Negative.  Past Medical History:  Diagnosis Date  . Allergy   . Anxiety   . Arthritis   . Asthma   . Atypical chest pain    Normal Myoview 2008  . Breast cancer ([place])   . Colon polyp   . GERD (gastroesophageal reflux disease)   . Hx of colonoscopy 03/18/09  . Hyperlipidemia   . Hypertension   .  Left nasal polyps   . MVP (mitral valve prolapse)   . Seasonal allergies   . Tubulovillous adenoma     Family History  Problem Relation Age of Onset  . Hypertension Son   . Lung cancer Father   . Cancer Father   . Hypertension Father   . Liver cancer Mother   . Cancer Mother   . Hypertension Mother   . Colon cancer Mother   . Colon cancer Sister   . Heart murmur Son   . Stomach cancer Neg Hx   . Ulcerative colitis Neg Hx   . Allergic rhinitis Neg Hx   . Angioedema Neg Hx   . Asthma Neg Hx     Social History   Socioeconomic History  . Marital status: Divorced    Spouse name: Not on file  . Number of children: Not on file  . Years of education: Not on file  . Highest education level: Not on file  Occupational History  . Occupation: Retired    Comment: English Professor  Social Needs  . Financial resource strain: Not on file  . Food insecurity:    Worry: Not on file    Inability: Not on file  . Transportation needs:    Medical: Not on file    Non-medical: Not on file  Tobacco Use  . Smoking status: Former Smoker    Last attempt to quit: 09/09/1977    Years since quitting:  40.0  . Smokeless tobacco: Never Used  . Tobacco comment: used to smoke socially. Quit in 1979.  Substance and Sexual Activity  . Alcohol use: Yes    Comment: usually has a glass of wine with dinner every evening  . Drug use: No  . Sexual activity: Not Currently    Birth control/protection: Other-see comments    Comment: HYST  Lifestyle  . Physical activity:    Days per week: Not on file    Minutes per session: Not on file  . Stress: Not on file  Relationships  . Social connections:    Talks on phone: Not on file    Gets together: Not on file    Attends religious service: Not on file    Active member of club or organization: Not on file    Attends meetings of clubs or organizations: Not on file    Relationship status: Not on file  . Intimate partner violence:    Fear of current or ex partner: Not on file    Emotionally abused: Not on file    Physically abused: Not on file    Forced sexual activity: Not on file  Other Topics Concern  . Not on file  Social History Narrative  . Not on file    I appreciate the opportunity to take part in Lakitha's care. Please do not hesitate to contact me with questions.  Sincerely,   R. Edgar Frisk, MD

## 2017-09-27 NOTE — Assessment & Plan Note (Signed)
   Continue appropriate allergen avoidance measures and budesonide/saline nasal irrigation 1-2 times per day.    For thick post nasal drainage, add guaifenesin 600 mg (Mucinex)  twice daily as needed with adequate hydration as discussed.

## 2017-09-27 NOTE — Assessment & Plan Note (Signed)
   A prescription has been provided for triamcinolone 0.1% paste to be applied to affected area twice daily as needed for the next 7-14 days.

## 2017-10-06 ENCOUNTER — Other Ambulatory Visit: Payer: Self-pay | Admitting: Family Medicine

## 2017-10-06 NOTE — Telephone Encounter (Signed)
Last OV 07/18/17 Lorazepam last filled 08/08/17 #60 with 1

## 2017-10-20 ENCOUNTER — Other Ambulatory Visit: Payer: Self-pay | Admitting: Allergy and Immunology

## 2017-12-09 ENCOUNTER — Other Ambulatory Visit: Payer: Self-pay | Admitting: Internal Medicine

## 2017-12-09 NOTE — Telephone Encounter (Signed)
[place] Controlled Substance Database checked. Last filled on 11/07/17

## 2017-12-16 ENCOUNTER — Ambulatory Visit: Payer: Medicare Other | Admitting: Internal Medicine

## 2017-12-16 ENCOUNTER — Encounter: Payer: Self-pay | Admitting: Internal Medicine

## 2017-12-16 VITALS — BP 110/72 | HR 71 | Temp 98.5°F | Resp 18 | Ht 61.0 in | Wt 115.0 lb

## 2017-12-16 DIAGNOSIS — J209 Acute bronchitis, unspecified: Secondary | ICD-10-CM | POA: Diagnosis not present

## 2017-12-16 MED ORDER — AZITHROMYCIN 250 MG PO TABS: ORAL_TABLET | ORAL | 0 refills | Status: DC

## 2017-12-16 NOTE — Assessment & Plan Note (Signed)
Experiencing 4 days of worsening cough that is different from her chronic cough Concern for bacterial infection and given her lung cancer and being on chemotherapy I have a very low threshold for starting antibiotics Start azithromycin Over-the-counter cough syrup and cold medications for symptom relief She will inform her oncologist that we start an antibiotic She will monitor her symptoms closely and if they worsen or are not improving advised that she needs to be reevaluated

## 2017-12-16 NOTE — Patient Instructions (Signed)
Take the zpak as directed.  Continue the over the counter medications  Increase your rest and fluids.   Call if no improvement    Let your oncologist know you have been started on an antibiotic.

## 2017-12-16 NOTE — Progress Notes (Signed)
Subjective:    Patient ID: Amanda Jackson, female    DOB: 29-Oct-1940, 77 y.o.   MRN: [id number]  HPI The patient is here for an acute visit.  She has metastatic lung cancer and is undergoing chemotherapy.  For the past 4 days she has had a worsening cough.  She always has some cough, but this has been different.  She can feel the phlegm in her throat but can not get it up.    She has headaches, fever/chills, lightheadedness, mild nasal congestion, runny nose, shortness of breath, wheezing and occasional nausea.  Some of the symptoms are not new and related to her lung cancer and treatment.  She called her oncologist and they prescribed tessalon perles and they have not helped.  She has also taken tylenol, robitussin and coricidin.     Her next chemotherapy session is in one week.    Medications and allergies reviewed with patient and updated if appropriate.  Patient Active Problem List   Diagnosis Date Noted  . Lip/mouth sore 09/27/2017  . Knee osteoarthritis 09/23/2017  . Adenocarcinoma of lung ([place]) 07/18/2017  . NSTEMI (non-ST elevated myocardial infarction) ([place])   . Chest pain 07/01/2017  . 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 Hoarseness of voice 05/24/2017  . Migraine 01/11/2017  . Persistent cough 11/24/2015  . Vasovagal syncope 10/15/2015  . Hypokalemia 08/22/2015  . Syncope 07/30/2015  . History of nasal polyps 03/25/2015  . Abdominal pain, left lower quadrant 11/04/2014  . Leg pain, posterior 11/05/2013  . Mild persistent asthma 05/08/2013  . Loss of weight 12/05/2012  . Abnormal TSH 10/11/2012  . GERD (gastroesophageal reflux disease) 04/12/2012  . Allergic rhinitis   . MVP (mitral valve prolapse)   . Tubulovillous adenoma   . Hyperlipidemia 07/20/2011  . Anxiety 04/12/2011  . Hx of adenomatous colonic polyps 04/12/2011  . Hepatic cyst 04/12/2011  . History of mitral valve prolapse 04/12/2011  . Essential  hypertension 01/17/2009    Current Outpatient Medications on File Prior to Visit  Medication Sig Dispense Refill  . acetaminophen (TYLENOL ARTHRITIS PAIN) 650 MG CR tablet Take 650 mg by mouth every 8 (eight) hours as needed for pain.    . benzonatate (TESSALON) 100 MG capsule Take 100 mg by mouth 3 (three) times daily as needed for cough.    . carvedilol (COREG) 12.5 MG tablet TAKE 1 TABLET BY MOUTH TWICE A DAY WITH A MEAL 180 tablet 3  . dexamethasone (DECADRON) 4 MG tablet Take 4 mg by mouth daily as needed.    . dronabinol (MARINOL) 2.5 MG capsule TAKE 1 CAPSULE BY MOUTH TWICE A DAY BEFORE MEALS  0  . fluticasone (FLOVENT HFA) 110 MCG/ACT inhaler Inhale 2 puffs into the lungs 2 (two) times daily. 1 Inhaler 2  . fluticasone (FLOVENT HFA) 220 MCG/ACT inhaler Inhale 2 puffs 2 (two) times daily into the lungs. (Patient taking differently: Inhale 3 puffs into the lungs 2 (two) times daily. ) 1 Inhaler 5  . KLOR-CON M20 20 MEQ tablet TAKE 1 TABLET BY MOUTH EVERY DAY (Patient taking differently: TAKE 1 TABLET (20 MEQ)  BY MOUTH EVERY DAY) 90 tablet 3  . LORazepam (ATIVAN) 1 MG tablet TAKE 1 TABLET BY MOUTH TWICE A DAY 60 tablet 1  . montelukast (SINGULAIR) 10 MG tablet TAKE 1 TABLET BY MOUTH EVERY DAY (Patient taking differently: TAKE 1 TABLET (10 MG) BY MOUTH EVERY DAY AT BEDTIME) 30 tablet  0  . montelukast (SINGULAIR) 10 MG tablet TAKE 1 TABLET BY MOUTH EVERY DAY 30 tablet 4  . Multiple Vitamin (MULTIVITAMIN WITH MINERALS) TABS tablet Take 1 tablet by mouth daily. Centrum    . nitroGLYCERIN (NITROSTAT) 0.4 MG SL tablet Place 1 tablet (0.4 mg total) under the tongue every 5 (five) minutes as needed. For chest pain (patient unsure of MAX doses allowed) (Patient taking differently: Place 0.4 mg under the tongue every 5 (five) minutes as needed for chest pain. ) 30 tablet 1  . omeprazole (PRILOSEC) 20 MG capsule TAKE 1 CAPSULE (20 MG TOTAL) BY MOUTH 2 (TWO) TIMES DAILY. 180 capsule 1  . PROAIR HFA 108  (90 Base) MCG/ACT inhaler INHALE TWO PUFFS EVERY 4 HOURS AS NEEDED FOR COUGH OR WHEEZE. 18 Inhaler 1  . spironolactone (ALDACTONE) 25 MG tablet Take 1 tablet (25 mg total) by mouth daily. Please keep 01/11/17 appt for furhter refills 90 tablet 3  . traMADol (ULTRAM) 50 MG tablet Take 50 mg by mouth daily as needed (knee pain). Reported on 07/23/2015  0  . triamcinolone (KENALOG) 0.1 % paste Use as directed 1 application in the mouth or throat 2 (two) times daily. 5 g 4  . triamcinolone cream (KENALOG) 0.1 % 1 APPLICATION TO AFFECTED AREA TWICE A DAY AS NEEDED FOR RED SPOTS ON BACK  0  . lisinopril (PRINIVIL,ZESTRIL) 5 MG tablet Take 1 tablet (5 mg total) by mouth daily. 90 tablet 3   No current facility-administered medications on file prior to visit.     Past Medical History:  Diagnosis Date  . Allergy   . Anxiety   . Arthritis   . Asthma   . Atypical chest pain    Normal Myoview 2008  . Breast cancer ([place])   . Colon polyp   . GERD (gastroesophageal reflux disease)   . Hx of colonoscopy 03/18/09  . Hyperlipidemia   . Hypertension   . Left nasal polyps   . MVP (mitral valve prolapse)   . Seasonal allergies   . Tubulovillous adenoma     Past Surgical History:  Procedure Laterality Date  . ABDOMINAL HYSTERECTOMY    . BREAST SURGERY  2010   biopsy-benign  . BUNIONECTOMY    . COLONOSCOPY    . FUNCTIONAL ENDOSCOPIC SINUS SURGERY  08/28/08   with bilateral ethmoidectomies and bilateral maxillary sinus ostial enlargement with removal of mucocyst, mucous membrane, and mucoid material  . KNEE ARTHROSCOPY W/ LATERAL RELEASE  11/17/99   Left  . KNEE ARTHROSCOPY W/ PARTIAL MEDIAL MENISCECTOMY  11/17/99   left  . Laparoscopic Right Colectomy  01/25/03  . LEFT HEART CATH AND CORONARY ANGIOGRAPHY N/A 07/04/2017   Procedure: LEFT HEART CATH AND CORONARY ANGIOGRAPHY;  Surgeon: Leonie Man, MD;  Location: [place] [facility];  Service: Cardiovascular;  Laterality: N/A;  . nasal polyp removal     . POLYPECTOMY    . TONSILLECTOMY  1952  . VIDEO BRONCHOSCOPY Bilateral 07/05/2017   Procedure: VIDEO BRONCHOSCOPY WITHOUT FLUORO;  Surgeon: Collene Gobble, MD;  Location: [facility] ENDOSCOPY;  Service: Cardiopulmonary;  Laterality: Bilateral;  . VOCAL CORD INJECTION      Social History   Socioeconomic History  . Marital status: Divorced    Spouse name: Not on file  . Number of children: Not on file  . Years of education: Not on file  . Highest education level: Not on file  Occupational History  . Occupation: Retired    Comment: English Professor  Social Needs  . Financial resource strain: Not on file  . Food insecurity:    Worry: Not on file    Inability: Not on file  . Transportation needs:    Medical: Not on file    Non-medical: Not on file  Tobacco Use  . Smoking status: Former Smoker    Last attempt to quit: 09/09/1977    Years since quitting: 40.2  . Smokeless tobacco: Never Used  . Tobacco comment: used to smoke socially. Quit in 1979.  Substance and Sexual Activity  . Alcohol use: Yes    Comment: usually has a glass of wine with dinner every evening  . Drug use: No  . Sexual activity: Not Currently    Birth control/protection: Other-see comments    Comment: HYST  Lifestyle  . Physical activity:    Days per week: Not on file    Minutes per session: Not on file  . Stress: Not on file  Relationships  . Social connections:    Talks on phone: Not on file    Gets together: Not on file    Attends religious service: Not on file    Active member of club or organization: Not on file    Attends meetings of clubs or organizations: Not on file    Relationship status: Not on file  Other Topics Concern  . Not on file  Social History Narrative  . Not on file    Family History  Problem Relation Age of Onset  . Hypertension Son   . Lung cancer Father   . Cancer Father   . Hypertension Father   . Liver cancer Mother   . Cancer Mother   . Hypertension Mother   . Colon  cancer Mother   . Colon cancer Sister   . Heart murmur Son   . Stomach cancer Neg Hx   . Ulcerative colitis Neg Hx   . Allergic rhinitis Neg Hx   . Angioedema Neg Hx   . Asthma Neg Hx     Review of Systems  Constitutional: Positive for chills and fever.  HENT: Positive for congestion (clear) and rhinorrhea. Negative for ear pain, sinus pressure and sore throat.   Respiratory: Positive for cough, shortness of breath and wheezing.   Gastrointestinal: Positive for nausea (chronic).  Neurological: Positive for light-headedness and headaches.       Objective:   Vitals:   12/16/17 1339  BP: 110/72  Pulse: 71  Resp: 18  Temp: 98.5 F (36.9 C)  SpO2: 93%   BP Readings from Last 3 Encounters:  12/16/17 110/72  09/27/17 (!) 90/50  09/23/17 96/64   Wt Readings from Last 3 Encounters:  12/16/17 115 lb (52.2 kg)  09/23/17 109 lb (49.4 kg)  07/18/17 111 lb 2 oz (50.4 kg)   Body mass index is 21.73 kg/m.   Physical Exam    GENERAL APPEARANCE: Chronically ill-appearing, NAD EYES: conjunctiva clear, no icterus HEENT: bilateral tympanic membranes and ear canals normal, oropharynx with mild erythema, no thyromegaly, trachea midline, no cervical or supraclavicular lymphadenopathy LUNGS: Diffusely decreased breath sounds unlabored breathing, no wheeze or crackles CARDIOVASCULAR: Normal S1,S2 without murmurs, no edema SKIN: Warm, dry      Assessment & Plan:    See Problem List for Assessment and Plan of chronic medical problems.

## 2018-02-05 ENCOUNTER — Other Ambulatory Visit: Payer: Self-pay | Admitting: Allergy and Immunology

## 2018-02-06 NOTE — Progress Notes (Signed)
Subjective:    Patient ID: Amanda Jackson, female    DOB: 03/01/1941, 76 y.o.   MRN: [id number]  HPI The patient is here for an acute visit.  She is urinary frequency and occasionally incontinence and has been wearing and always pad just in case.  She is also been wearing her girdle over 2 but this is not noticeable.  She is unsure if this is related but noticed a tender lump in her right groin and wondered if it was from irritation from the above.  Has gotten larger and it is slightly red.  It is painful.  She did notice a small spot of blood, but denies other drainage.  The area is getting larger.  She is currently on chemotherapy for metastatic lung cancer.  She has not had any fevers.  She has not put anything on the area because she was not sure what to do with it.  Medications and allergies reviewed with patient and updated if appropriate.  Patient Active Problem List   Diagnosis Date Noted  . Acute bronchitis 12/16/2017  . Lip/mouth sore 09/27/2017  . Knee osteoarthritis 09/23/2017  . Adenocarcinoma of lung ([place]) 07/18/2017  . NSTEMI (non-ST elevated myocardial infarction) ([place])   . Chest pain 07/01/2017  . 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 Hoarseness of voice 05/24/2017  . Migraine 01/11/2017  . Persistent cough 11/24/2015  . Vasovagal syncope 10/15/2015  . Hypokalemia 08/22/2015  . Syncope 07/30/2015  . History of nasal polyps 03/25/2015  . Abdominal pain, left lower quadrant 11/04/2014  . Leg pain, posterior 11/05/2013  . Mild persistent asthma 05/08/2013  . Loss of weight 12/05/2012  . Abnormal TSH 10/11/2012  . GERD (gastroesophageal reflux disease) 04/12/2012  . Allergic rhinitis   . MVP (mitral valve prolapse)   . Tubulovillous adenoma   . Hyperlipidemia 07/20/2011  . Anxiety 04/12/2011  . Hx of adenomatous colonic polyps 04/12/2011  . Hepatic cyst 04/12/2011  . History of mitral valve prolapse 04/12/2011  .  Essential hypertension 01/17/2009    Current Outpatient Medications on File Prior to Visit  Medication Sig Dispense Refill  . acetaminophen (TYLENOL ARTHRITIS PAIN) 650 MG CR tablet Take 650 mg by mouth every 8 (eight) hours as needed for pain.    . carvedilol (COREG) 12.5 MG tablet TAKE 1 TABLET BY MOUTH TWICE A DAY WITH A MEAL 180 tablet 3  . dexamethasone (DECADRON) 4 MG tablet Take 4 mg by mouth daily as needed.    . dronabinol (MARINOL) 2.5 MG capsule TAKE 1 CAPSULE BY MOUTH TWICE A DAY BEFORE MEALS  0  . fluticasone (FLOVENT HFA) 220 MCG/ACT inhaler Inhale 2 puffs 2 (two) times daily into the lungs. (Patient taking differently: Inhale 3 puffs into the lungs 2 (two) times daily. ) 1 Inhaler 5  . KLOR-CON M20 20 MEQ tablet TAKE 1 TABLET BY MOUTH EVERY DAY (Patient taking differently: TAKE 1 TABLET (20 MEQ)  BY MOUTH EVERY DAY) 90 tablet 3  . montelukast (SINGULAIR) 10 MG tablet TAKE 1 TABLET BY MOUTH EVERY DAY (Patient taking differently: TAKE 1 TABLET (10 MG) BY MOUTH EVERY DAY AT BEDTIME) 30 tablet 0  . Multiple Vitamin (MULTIVITAMIN WITH MINERALS) TABS tablet Take 1 tablet by mouth daily. Centrum    . nitroGLYCERIN (NITROSTAT) 0.4 MG SL tablet Place 1 tablet (0.4 mg total) under the tongue every 5 (five) minutes as needed. For chest pain (patient unsure of MAX doses  allowed) (Patient taking differently: Place 0.4 mg under the tongue every 5 (five) minutes as needed for chest pain. ) 30 tablet 1  . omeprazole (PRILOSEC) 20 MG capsule TAKE 1 CAPSULE (20 MG TOTAL) BY MOUTH 2 (TWO) TIMES DAILY. 180 capsule 1  . spironolactone (ALDACTONE) 25 MG tablet Take 1 tablet (25 mg total) by mouth daily. Please keep 01/11/17 appt for furhter refills 90 tablet 3  . traMADol (ULTRAM) 50 MG tablet Take 50 mg by mouth daily as needed (knee pain). Reported on 07/23/2015  0  . triamcinolone (KENALOG) 0.1 % paste Use as directed 1 application in the mouth or throat 2 (two) times daily. 5 g 4  . triamcinolone cream  (KENALOG) 0.1 % 1 APPLICATION TO AFFECTED AREA TWICE A DAY AS NEEDED FOR RED SPOTS ON BACK  0  . VENTOLIN HFA 108 (90 Base) MCG/ACT inhaler INHALE TWO PUFFS EVERY 4 HOURS AS NEEDED FOR COUGH OR WHEEZE. 18 Inhaler 1  . lisinopril (PRINIVIL,ZESTRIL) 5 MG tablet Take 1 tablet (5 mg total) by mouth daily. 90 tablet 3   No current facility-administered medications on file prior to visit.     Past Medical History:  Diagnosis Date  . Allergy   . Anxiety   . Arthritis   . Asthma   . Atypical chest pain    Normal Myoview 2008  . Breast cancer ([place])   . Colon polyp   . GERD (gastroesophageal reflux disease)   . Hx of colonoscopy 03/18/09  . Hyperlipidemia   . Hypertension   . Left nasal polyps   . MVP (mitral valve prolapse)   . Seasonal allergies   . Tubulovillous adenoma     Past Surgical History:  Procedure Laterality Date  . ABDOMINAL HYSTERECTOMY    . BREAST SURGERY  2010   biopsy-benign  . BUNIONECTOMY    . COLONOSCOPY    . FUNCTIONAL ENDOSCOPIC SINUS SURGERY  08/28/08   with bilateral ethmoidectomies and bilateral maxillary sinus ostial enlargement with removal of mucocyst, mucous membrane, and mucoid material  . KNEE ARTHROSCOPY W/ LATERAL RELEASE  11/17/99   Left  . KNEE ARTHROSCOPY W/ PARTIAL MEDIAL MENISCECTOMY  11/17/99   left  . Laparoscopic Right Colectomy  01/25/03  . LEFT HEART CATH AND CORONARY ANGIOGRAPHY N/A 07/04/2017   Procedure: LEFT HEART CATH AND CORONARY ANGIOGRAPHY;  Surgeon: Leonie Man, MD;  Location: [place] [facility];  Service: Cardiovascular;  Laterality: N/A;  . nasal polyp removal    . POLYPECTOMY    . TONSILLECTOMY  1952  . VIDEO BRONCHOSCOPY Bilateral 07/05/2017   Procedure: VIDEO BRONCHOSCOPY WITHOUT FLUORO;  Surgeon: Collene Gobble, MD;  Location: [facility] ENDOSCOPY;  Service: Cardiopulmonary;  Laterality: Bilateral;  . VOCAL CORD INJECTION      Social History   Socioeconomic History  . Marital status: Divorced    Spouse name: Not on file    . Number of children: Not on file  . Years of education: Not on file  . Highest education level: Not on file  Occupational History  . Occupation: Retired    Comment: English Professor  Social Needs  . Financial resource strain: Not on file  . Food insecurity:    Worry: Not on file    Inability: Not on file  . Transportation needs:    Medical: Not on file    Non-medical: Not on file  Tobacco Use  . Smoking status: Former Smoker    Last attempt to quit: 09/09/1977  Years since quitting: 40.4  . Smokeless tobacco: Never Used  . Tobacco comment: used to smoke socially. Quit in 1979.  Substance and Sexual Activity  . Alcohol use: Yes    Comment: usually has a glass of wine with dinner every evening  . Drug use: No  . Sexual activity: Not Currently    Birth control/protection: Other-see comments    Comment: HYST  Lifestyle  . Physical activity:    Days per week: Not on file    Minutes per session: Not on file  . Stress: Not on file  Relationships  . Social connections:    Talks on phone: Not on file    Gets together: Not on file    Attends religious service: Not on file    Active member of club or organization: Not on file    Attends meetings of clubs or organizations: Not on file    Relationship status: Not on file  Other Topics Concern  . Not on file  Social History Narrative  . Not on file    Family History  Problem Relation Age of Onset  . Hypertension Son   . Lung cancer Father   . Cancer Father   . Hypertension Father   . Liver cancer Mother   . Cancer Mother   . Hypertension Mother   . Colon cancer Mother   . Colon cancer Sister   . Heart murmur Son   . Stomach cancer Neg Hx   . Ulcerative colitis Neg Hx   . Allergic rhinitis Neg Hx   . Angioedema Neg Hx   . Asthma Neg Hx     Review of Systems  Constitutional: Negative for chills and fever.  Gastrointestinal: Negative for abdominal pain.  Genitourinary: Positive for frequency (Chronic, some  incontinence).  Skin: Positive for color change (Red) and wound.       Objective:   Vitals:   02/07/18 1527  BP: 114/62  Pulse: 89  Resp: 16  Temp: 98 F (36.7 C)  SpO2: 97%   BP Readings from Last 3 Encounters:  02/07/18 114/62  12/16/17 110/72  09/27/17 (!) 90/50   Wt Readings from Last 3 Encounters:  02/07/18 115 lb 6.4 oz (52.3 kg)  12/16/17 115 lb (52.2 kg)  09/23/17 109 lb (49.4 kg)   Body mass index is 21.8 kg/m.   Physical Exam  Constitutional:  Chronically occult ill-appearing, but nondiaphoretic and not distressed  Skin: There is erythema.  Approximately 3 cm x 1.5 cm abscess right groin that is slightly warm, tender and erythematous.  There is a very small opening on the medial aspect where looks like there is a small amount of pus trying to drain.  With pressure approximately 3 mL's of pus was drained.  There is still couple areas of induration.           Assessment & Plan:    See Problem List for Assessment and Plan of chronic medical problems.

## 2018-02-07 ENCOUNTER — Ambulatory Visit (INDEPENDENT_AMBULATORY_CARE_PROVIDER_SITE_OTHER): Payer: Medicare Other | Admitting: Internal Medicine

## 2018-02-07 ENCOUNTER — Encounter: Payer: Self-pay | Admitting: Internal Medicine

## 2018-02-07 ENCOUNTER — Other Ambulatory Visit: Payer: Self-pay | Admitting: Internal Medicine

## 2018-02-07 VITALS — BP 114/62 | HR 89 | Temp 98.0°F | Resp 16 | Ht 61.0 in | Wt 115.4 lb

## 2018-02-07 DIAGNOSIS — L0291 Cutaneous abscess, unspecified: Secondary | ICD-10-CM | POA: Diagnosis not present

## 2018-02-07 MED ORDER — CEPHALEXIN 500 MG PO CAPS: 500.0000 mg | ORAL_CAPSULE | Freq: Three times a day (TID) | ORAL | 0 refills | Status: DC

## 2018-02-07 NOTE — Patient Instructions (Addendum)
Take the antibiotic as prescribed.  Apply warm compresses or sit in a warm tub to help the abscess drain.    Let your oncologist know that you have an infection.   Call with any questions or concerns.     Skin Abscess A skin abscess is an infected area on or under your skin that contains a collection of pus and other material. An abscess may also be called a furuncle, carbuncle, or boil. An abscess can occur in or on almost any part of your body. Some abscesses break open (rupture) on their own. Most continue to get worse unless they are treated. The infection can spread deeper into the body and eventually into your blood, which can make you feel ill. Treatment usually involves draining the abscess. What are the causes? An abscess occurs when germs, often bacteria, pass through your skin and cause an infection. This may be caused by:  A scrape or cut on your skin.  A puncture wound through your skin, including a needle injection.  Blocked oil or sweat glands.  Blocked and infected hair follicles.  A cyst that forms beneath your skin (sebaceous cyst) and becomes infected.  What increases the risk? This condition is more likely to develop in people who:  Have a weak body defense system (immune system).  Have diabetes.  Have dry and irritated skin.  Get frequent injections or use illegal IV drugs.  Have a foreign body in a wound, such as a splinter.  Have problems with their lymph system or veins.  What are the signs or symptoms? An abscess may start as a painful, firm bump under the skin. Over time, the abscess may get larger or become softer. Pus may appear at the top of the abscess, causing pressure and pain. It may eventually break through the skin and drain. Other symptoms include:  Redness.  Warmth.  Swelling.  Tenderness.  A sore on the skin.  How is this diagnosed? This condition is diagnosed based on your medical history and a physical exam. A sample of  pus may be taken from the abscess to find out what is causing the infection and what antibiotics can be used to treat it. You also may have:  Blood tests to look for signs of infection or spread of an infection to your blood.  Imaging studies such as ultrasound, CT scan, or MRI if the abscess is deep.  How is this treated? Small abscesses that drain on their own may not need treatment. Treatment for an abscess that does not rupture on its own may include:  Warm compresses applied to the area several times per day.  Incision and drainage. Your health care provider will make an incision to open the abscess and will remove pus and any foreign body or dead tissue. The incision area may be packed with gauze to keep it open for a few days while it heals.  Antibiotic medicines to treat infection. For a severe abscess, you may first get antibiotics through an IV and then change to oral antibiotics.  Follow these instructions at home: Abscess Care  If you have an abscess that has not drained, place a warm, clean, wet washcloth over the abscess several times a day. Do this as told by your health care provider.  Follow instructions from your health care provider about how to take care of your abscess. Make sure you: ? Cover the abscess with a bandage (dressing). ? Change your dressing or gauze as told by  your health care provider. ? Wash your hands with soap and water before you change the dressing or gauze. If soap and water are not available, use hand sanitizer.  Check your abscess every day for signs of a worsening infection. Check for: ? More redness, swelling, or pain. ? More fluid or blood. ? Warmth. ? More pus or a bad smell. Medicines  Take over-the-counter and prescription medicines only as told by your health care provider.  If you were prescribed an antibiotic medicine, take it as told by your health care provider. Do not stop taking the antibiotic even if you start to feel  better. General instructions  To avoid spreading the infection: ? Do not share personal care items, towels, or hot tubs with others. ? Avoid making skin contact with other people.  Keep all follow-up visits as told by your health care provider. This is important. Contact a health care provider if:  You have more redness, swelling, or pain around your abscess.  You have more fluid or blood coming from your abscess.  Your abscess feels warm to the touch.  You have more pus or a bad smell coming from your abscess.  You have a fever.  You have muscle aches.  You have chills or a general ill feeling. Get help right away if:  You have severe pain.  You see red streaks on your skin spreading away from the abscess. This information is not intended to replace advice given to you by your health care provider. Make sure you discuss any questions you have with your health care provider. Document Released: 01/20/2005 Document Revised: 12/07/2015 Document Reviewed: 02/19/2015 Elsevier Interactive Patient Education  [employer].

## 2018-02-07 NOTE — Telephone Encounter (Signed)
Last follow-up OV 09/23/2017  [place] Controlled Substance Database checked. Last filled on 01/08/18

## 2018-02-07 NOTE — Assessment & Plan Note (Signed)
Abscess right groin Much of the pus was drained-approximately 3 mls was removed, but there is still a small amount remaining Start Keflex 3 times daily x10 days Warm compresses or baths Advised her to inform her oncologist that she does have an infection and is on antibiotic She will call with any questions/concerns and will monitor the area closely to make sure that it completely resolves

## 2018-02-16 NOTE — Progress Notes (Signed)
Subjective:    Patient ID: Amanda Jackson, female    DOB: January 23, 1941, 77 y.o.   MRN: [id number]  HPI She is here for an acute visit for cold symptoms.  She is still on Keflex for an abscess in her right groin.  She states the abscess is looking better and she only has a few pills left.  Her symptoms started several weeks ago.  Her biggest concern is that she is experiencing a persistent cough.  The cough has been bad for the past 2-3 weeks.  She was sick in august and I prescribed a zpak and it went away but has recurred.  Her cough is productive.   She states some wheezing and increased shortness of breath from her chronic shortness of breath.  She does have some nasal congestion, sore throat and some headaches.  She has a lot of clear mucus from her nose.  She denies any fevers, chills, ear pain or sinus pain.  She is scheduled for a CT scan of her chest next week and will have chemotherapy shortly after.   She has taken robitussin.  She is also been on the Keflex.  Medications and allergies reviewed with patient and updated if appropriate.  Patient Active Problem List   Diagnosis Date Noted  . Abscess 02/07/2018  . Acute bronchitis 12/16/2017  . Lip/mouth sore 09/27/2017  . Knee osteoarthritis 09/23/2017  . Adenocarcinoma of lung ([place]) 07/18/2017  . NSTEMI (non-ST elevated myocardial infarction) ([place])   . Chest pain 07/01/2017  . 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 Hoarseness of voice 05/24/2017  . Migraine 01/11/2017  . Persistent cough 11/24/2015  . Vasovagal syncope 10/15/2015  . Hypokalemia 08/22/2015  . Syncope 07/30/2015  . History of nasal polyps 03/25/2015  . Abdominal pain, left lower quadrant 11/04/2014  . Leg pain, posterior 11/05/2013  . Mild persistent asthma 05/08/2013  . Loss of weight 12/05/2012  . Abnormal TSH 10/11/2012  . GERD (gastroesophageal reflux disease) 04/12/2012  . Allergic rhinitis   . MVP (mitral  valve prolapse)   . Tubulovillous adenoma   . Hyperlipidemia 07/20/2011  . Anxiety 04/12/2011  . Hx of adenomatous colonic polyps 04/12/2011  . Hepatic cyst 04/12/2011  . History of mitral valve prolapse 04/12/2011  . Essential hypertension 01/17/2009    Current Outpatient Medications on File Prior to Visit  Medication Sig Dispense Refill  . acetaminophen (TYLENOL ARTHRITIS PAIN) 650 MG CR tablet Take 650 mg by mouth every 8 (eight) hours as needed for pain.    . carvedilol (COREG) 12.5 MG tablet TAKE 1 TABLET BY MOUTH TWICE A DAY WITH A MEAL 180 tablet 3  . cephALEXin (KEFLEX) 500 MG capsule Take 1 capsule (500 mg total) by mouth 3 (three) times daily. 30 capsule 0  . dexamethasone (DECADRON) 4 MG tablet Take 4 mg by mouth daily as needed.    . dronabinol (MARINOL) 2.5 MG capsule TAKE 1 CAPSULE BY MOUTH TWICE A DAY BEFORE MEALS  0  . fluticasone (FLOVENT HFA) 220 MCG/ACT inhaler Inhale 2 puffs 2 (two) times daily into the lungs. (Patient taking differently: Inhale 3 puffs into the lungs 2 (two) times daily. ) 1 Inhaler 5  . KLOR-CON M20 20 MEQ tablet TAKE 1 TABLET BY MOUTH EVERY DAY (Patient taking differently: TAKE 1 TABLET (20 MEQ)  BY MOUTH EVERY DAY) 90 tablet 3  . LORazepam (ATIVAN) 1 MG tablet TAKE 1 TABLET BY MOUTH TWICE A  DAY 60 tablet 1  . montelukast (SINGULAIR) 10 MG tablet TAKE 1 TABLET BY MOUTH EVERY DAY (Patient taking differently: TAKE 1 TABLET (10 MG) BY MOUTH EVERY DAY AT BEDTIME) 30 tablet 0  . Multiple Vitamin (MULTIVITAMIN WITH MINERALS) TABS tablet Take 1 tablet by mouth daily. Centrum    . nitroGLYCERIN (NITROSTAT) 0.4 MG SL tablet Place 1 tablet (0.4 mg total) under the tongue every 5 (five) minutes as needed. For chest pain (patient unsure of MAX doses allowed) (Patient taking differently: Place 0.4 mg under the tongue every 5 (five) minutes as needed for chest pain. ) 30 tablet 1  . omeprazole (PRILOSEC) 20 MG capsule TAKE 1 CAPSULE (20 MG TOTAL) BY MOUTH 2 (TWO)  TIMES DAILY. 180 capsule 1  . spironolactone (ALDACTONE) 25 MG tablet Take 1 tablet (25 mg total) by mouth daily. Please keep 01/11/17 appt for furhter refills 90 tablet 3  . traMADol (ULTRAM) 50 MG tablet Take 50 mg by mouth daily as needed (knee pain). Reported on 07/23/2015  0  . triamcinolone (KENALOG) 0.1 % paste Use as directed 1 application in the mouth or throat 2 (two) times daily. 5 g 4  . triamcinolone cream (KENALOG) 0.1 % 1 APPLICATION TO AFFECTED AREA TWICE A DAY AS NEEDED FOR RED SPOTS ON BACK  0  . VENTOLIN HFA 108 (90 Base) MCG/ACT inhaler INHALE TWO PUFFS EVERY 4 HOURS AS NEEDED FOR COUGH OR WHEEZE. 18 Inhaler 1  . lisinopril (PRINIVIL,ZESTRIL) 5 MG tablet Take 1 tablet (5 mg total) by mouth daily. 90 tablet 3   No current facility-administered medications on file prior to visit.     Past Medical History:  Diagnosis Date  . Allergy   . Anxiety   . Arthritis   . Asthma   . Atypical chest pain    Normal Myoview 2008  . Breast cancer ([place])   . Colon polyp   . GERD (gastroesophageal reflux disease)   . Hx of colonoscopy 03/18/09  . Hyperlipidemia   . Hypertension   . Left nasal polyps   . MVP (mitral valve prolapse)   . Seasonal allergies   . Tubulovillous adenoma     Past Surgical History:  Procedure Laterality Date  . ABDOMINAL HYSTERECTOMY    . BREAST SURGERY  2010   biopsy-benign  . BUNIONECTOMY    . COLONOSCOPY    . FUNCTIONAL ENDOSCOPIC SINUS SURGERY  08/28/08   with bilateral ethmoidectomies and bilateral maxillary sinus ostial enlargement with removal of mucocyst, mucous membrane, and mucoid material  . KNEE ARTHROSCOPY W/ LATERAL RELEASE  11/17/99   Left  . KNEE ARTHROSCOPY W/ PARTIAL MEDIAL MENISCECTOMY  11/17/99   left  . Laparoscopic Right Colectomy  01/25/03  . LEFT HEART CATH AND CORONARY ANGIOGRAPHY N/A 07/04/2017   Procedure: LEFT HEART CATH AND CORONARY ANGIOGRAPHY;  Surgeon: Leonie Man, MD;  Location: [facility];  Service:  Cardiovascular;  Laterality: N/A;  . nasal polyp removal    . POLYPECTOMY    . TONSILLECTOMY  1952  . VIDEO BRONCHOSCOPY Bilateral 07/05/2017   Procedure: VIDEO BRONCHOSCOPY WITHOUT FLUORO;  Surgeon: Collene Gobble, MD;  Location[facility];  Service: Cardiopulmonary;  Laterality: Bilateral;  . VOCAL CORD INJECTION      Social History   Socioeconomic History  . Marital status: Divorced    Spouse name: Not on file  . Number of children: Not on file  . Years of education: Not on file  . Highest education  level: Not on file  Occupational History  . Occupation: Retired    Comment: English Professor  Social Needs  . Financial resource strain: Not on file  . Food insecurity:    Worry: Not on file    Inability: Not on file  . Transportation needs:    Medical: Not on file    Non-medical: Not on file  Tobacco Use  . Smoking status: Former Smoker    Last attempt to quit: 09/09/1977    Years since quitting: 40.4  . Smokeless tobacco: Never Used  . Tobacco comment: used to smoke socially. Quit in 1979.  Substance and Sexual Activity  . Alcohol use: Yes    Comment: usually has a glass of wine with dinner every evening  . Drug use: No  . Sexual activity: Not Currently    Birth control/protection: Other-see comments    Comment: HYST  Lifestyle  . Physical activity:    Days per week: Not on file    Minutes per session: Not on file  . Stress: Not on file  Relationships  . Social connections:    Talks on phone: Not on file    Gets together: Not on file    Attends religious service: Not on file    Active member of club or organization: Not on file    Attends meetings of clubs or organizations: Not on file    Relationship status: Not on file  Other Topics Concern  . Not on file  Social History Narrative  . Not on file    Family History  Problem Relation Age of Onset  . Hypertension Son   . Lung cancer Father   . Cancer Father   . Hypertension Father   . Liver cancer  Mother   . Cancer Mother   . Hypertension Mother   . Colon cancer Mother   . Colon cancer Sister   . Heart murmur Son   . Stomach cancer Neg Hx   . Ulcerative colitis Neg Hx   . Allergic rhinitis Neg Hx   . Angioedema Neg Hx   . Asthma Neg Hx     Review of Systems  Constitutional: Negative for chills and fever.  HENT: Positive for congestion and sore throat (two days ago). Negative for ear pain and sinus pain.   Respiratory: Positive for cough, shortness of breath (increased) and wheezing.   Cardiovascular: Negative for chest pain.  Neurological: Positive for headaches.       Objective:   Vitals:   02/17/18 1026  BP: 108/64  Pulse: 86  Resp: (!) 22  Temp: 97.6 F (36.4 C)  SpO2: 91%   Filed Weights   02/17/18 1026  Weight: 114 lb (51.7 kg)   Body mass index is 21.54 kg/m.  BP Readings from Last 3 Encounters:  02/17/18 108/64  02/07/18 114/62  12/16/17 110/72    Wt Readings from Last 3 Encounters:  02/17/18 114 lb (51.7 kg)  02/07/18 115 lb 6.4 oz (52.3 kg)  12/16/17 115 lb (52.2 kg)     Physical Exam GENERAL APPEARANCE: Chronically ill-appearing, NAD EYES: conjunctiva clear, no icterus HEENT: bilateral tympanic membranes and ear canals normal, oropharynx with mild erythema, no thyromegaly, trachea midline, no cervical or supraclavicular lymphadenopathy LUNGS: unlabored breathing, good air entry bilaterally, no crackles, slight expiratory wheeze diffusely CARDIOVASCULAR: Normal S1,S2 without murmurs, no edema SKIN: warm, dry        Assessment & Plan:   See Problem List for Assessment and Plan of chronic  medical problems.

## 2018-02-17 ENCOUNTER — Ambulatory Visit (INDEPENDENT_AMBULATORY_CARE_PROVIDER_SITE_OTHER): Payer: Medicare Other | Admitting: Internal Medicine

## 2018-02-17 ENCOUNTER — Encounter: Payer: Self-pay | Admitting: Internal Medicine

## 2018-02-17 DIAGNOSIS — C3490 Malignant neoplasm of unspecified part of unspecified bronchus or lung: Secondary | ICD-10-CM | POA: Diagnosis not present

## 2018-02-17 DIAGNOSIS — I10 Essential (primary) hypertension: Secondary | ICD-10-CM | POA: Diagnosis not present

## 2018-02-17 DIAGNOSIS — J40 Bronchitis, not specified as acute or chronic: Secondary | ICD-10-CM | POA: Diagnosis not present

## 2018-02-17 MED ORDER — SPIRONOLACTONE 25 MG PO TABS: 12.5000 mg | ORAL_TABLET | Freq: Every day | ORAL | 3 refills | Status: DC

## 2018-02-17 MED ORDER — DOXYCYCLINE HYCLATE 100 MG PO TABS: 100.0000 mg | ORAL_TABLET | Freq: Two times a day (BID) | ORAL | 0 refills | Status: DC

## 2018-02-17 MED ORDER — HYDROCODONE-HOMATROPINE 5-1.5 MG/5ML PO SYRP: 5.0000 mL | ORAL_SOLUTION | Freq: Three times a day (TID) | ORAL | 0 refills | Status: DC | PRN

## 2018-02-17 NOTE — Assessment & Plan Note (Signed)
Metastatic lung cancer currently on chemotherapy Having signs of bronchitis, possible early pneumonia Started on doxycycline She has follow-up CT scan with oncology next week so no chest x-ray done

## 2018-02-17 NOTE — Assessment & Plan Note (Signed)
Her symptoms are suggestive of bronchitis and this is likely bacterial in nature She is very susceptible to infections given that she is currently on chemotherapy has lung cancer We will hold off on imaging since in a few days she will be getting a CT scan of the chest Empiric treatment for possible pneumonia-doxycycline daily x10 days, stop cephalexin Continue inhalers, nebulizer treatments Prescription cough syrup Call if no improvement-she does see oncology next week

## 2018-02-17 NOTE — Assessment & Plan Note (Signed)
She does feel lightheadedness at time and overall weak She has lost some weight with treatment for her metastatic lung cancer Blood pressure on low side so we will try decreasing spironolactone to 12.5 mg daily Continue other medications

## 2018-02-17 NOTE — Patient Instructions (Addendum)
Changes to your medications:  Decrease the spironolactone to 12. 5 mg daily  (BP medication)  Stop the first antibiotic - cephalexin   Start new antibiotic - doxycycline  Cough syrup as needed.   Use your inhalers.  Take coricidan cold products as needed.     Call if no improvement or with any questions.

## 2018-03-25 ENCOUNTER — Other Ambulatory Visit: Payer: Self-pay | Admitting: Cardiology

## 2018-03-25 NOTE — Patient Instructions (Addendum)
  Medications reviewed and updated.  Changes include :   The cough syrup was refilled.  Your prescription(s) have been submitted to your pharmacy. Please take as directed and contact our office if you believe you are having problem(s) with the medication(s).   Please followup in 6 months

## 2018-03-25 NOTE — Progress Notes (Signed)
Subjective:    Patient ID: Amanda Jackson, female    DOB: [birth year], 77 y.o.   MRN: [id number]  HPI The patient is here for follow up.  Hypertension, hypokalemia: She is taking her medication daily. She is compliant with a low sodium diet.  She has had some lightheadedness on occasion.  She has chronic SOB and palpitations with certain activities and when she is SOB.  She denies chest pain, edema. She is not xercising regularly.  She does not monitor her blood pressure at home.    Anxiety: She is taking her medication daily as prescribed. She denies any side effects from the medication. She feels her anxiety is well controlled and she is happy with her current dose of medication.   GERD:  She is taking her medication daily as prescribed.  She denies any GERD symptoms and feels her GERD is well controlled.   Metastatic lung cancer: she is following with [facility]ology [facility]  This month she has a Ct scan and treatment the next day.    Medications and allergies reviewed with patient and updated if appropriate.  Patient Active Problem List   Diagnosis Date Noted  . Bronchitis 02/17/2018  . Metastatic non-small cell lung cancer ([place]) 02/17/2018  . Abscess 02/07/2018  . Lip/mouth sore 09/27/2017  . Knee osteoarthritis 09/23/2017  . Adenocarcinoma of lung ([place]) 07/18/2017  . NSTEMI (non-ST elevated myocardial infarction) ([place])   . Chest pain 07/01/2017  . 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 Hoarseness of voice 05/24/2017  . Migraine 01/11/2017  . Persistent cough 11/24/2015  . Vasovagal syncope 10/15/2015  . Hypokalemia 08/22/2015  . Syncope 07/30/2015  . History of nasal polyps 03/25/2015  . Leg pain, posterior 11/05/2013  . Mild persistent asthma 05/08/2013  . Loss of weight 12/05/2012  . Abnormal TSH 10/11/2012  . GERD (gastroesophageal reflux disease) 04/12/2012  . Allergic rhinitis   . MVP (mitral valve prolapse)   . Tubulovillous  adenoma   . Hyperlipidemia 07/20/2011  . Anxiety 04/12/2011  . Hepatic cyst 04/12/2011  . History of mitral valve prolapse 04/12/2011  . Essential hypertension 01/17/2009    Current Outpatient Medications on File Prior to Visit  Medication Sig Dispense Refill  . acetaminophen (TYLENOL ARTHRITIS PAIN) 650 MG CR tablet Take 650 mg by mouth every 8 (eight) hours as needed for pain.    . carvedilol (COREG) 12.5 MG tablet TAKE 1 TABLET BY MOUTH TWICE A DAY WITH A MEAL 180 tablet 3  . dexamethasone (DECADRON) 4 MG tablet Take 4 mg by mouth daily as needed.    . dronabinol (MARINOL) 2.5 MG capsule TAKE 1 CAPSULE BY MOUTH TWICE A DAY BEFORE MEALS  0  . fluticasone (FLOVENT HFA) 220 MCG/ACT inhaler Inhale 2 puffs 2 (two) times daily into the lungs. (Patient taking differently: Inhale 3 puffs into the lungs 2 (two) times daily. ) 1 Inhaler 5  . KLOR-CON M20 20 MEQ tablet TAKE 1 TABLET BY MOUTH EVERY DAY (Patient taking differently: TAKE 1 TABLET (20 MEQ)  BY MOUTH EVERY DAY) 90 tablet 3  . LORazepam (ATIVAN) 1 MG tablet TAKE 1 TABLET BY MOUTH TWICE A DAY 60 tablet 1  . montelukast (SINGULAIR) 10 MG tablet TAKE 1 TABLET BY MOUTH EVERY DAY (Patient taking differently: TAKE 1 TABLET (10 MG) BY MOUTH EVERY DAY AT BEDTIME) 30 tablet 0  . Multiple Vitamin (MULTIVITAMIN WITH MINERALS) TABS tablet Take 1 tablet by mouth  daily. Centrum    . nitroGLYCERIN (NITROSTAT) 0.4 MG SL tablet Place 1 tablet (0.4 mg total) under the tongue every 5 (five) minutes as needed. For chest pain (patient unsure of MAX doses allowed) (Patient taking differently: Place 0.4 mg under the tongue every 5 (five) minutes as needed for chest pain. ) 30 tablet 1  . omeprazole (PRILOSEC) 20 MG capsule TAKE 1 CAPSULE (20 MG TOTAL) BY MOUTH 2 (TWO) TIMES DAILY. 180 capsule 1  . spironolactone (ALDACTONE) 25 MG tablet Take 0.5 tablets (12.5 mg total) by mouth daily. Please keep 01/11/17 appt for furhter refills 45 tablet 3  . traMADol (ULTRAM) 50  MG tablet Take 50 mg by mouth daily as needed (knee pain). Reported on 07/23/2015  0  . triamcinolone (KENALOG) 0.1 % paste Use as directed 1 application in the mouth or throat 2 (two) times daily. 5 g 4  . triamcinolone cream (KENALOG) 0.1 % 1 APPLICATION TO AFFECTED AREA TWICE A DAY AS NEEDED FOR RED SPOTS ON BACK  0  . VENTOLIN HFA 108 (90 Base) MCG/ACT inhaler INHALE TWO PUFFS EVERY 4 HOURS AS NEEDED FOR COUGH OR WHEEZE. 18 Inhaler 1  . lisinopril (PRINIVIL,ZESTRIL) 5 MG tablet Take 1 tablet (5 mg total) by mouth daily. 90 tablet 3   No current facility-administered medications on file prior to visit.     Past Medical History:  Diagnosis Date  . Allergy   . Anxiety   . Arthritis   . Asthma   . Atypical chest pain    Normal Myoview 2008  . Breast cancer ([place])   . Colon polyp   . GERD (gastroesophageal reflux disease)   . Hx of colonoscopy 03/18/09  . Hyperlipidemia   . Hypertension   . Left nasal polyps   . MVP (mitral valve prolapse)   . Seasonal allergies   . Tubulovillous adenoma     Past Surgical History:  Procedure Laterality Date  . ABDOMINAL HYSTERECTOMY    . BREAST SURGERY  2010   biopsy-benign  . BUNIONECTOMY    . COLONOSCOPY    . FUNCTIONAL ENDOSCOPIC SINUS SURGERY  08/28/08   with bilateral ethmoidectomies and bilateral maxillary sinus ostial enlargement with removal of mucocyst, mucous membrane, and mucoid material  . KNEE ARTHROSCOPY W/ LATERAL RELEASE  11/17/99   Left  . KNEE ARTHROSCOPY W/ PARTIAL MEDIAL MENISCECTOMY  11/17/99   left  . Laparoscopic Right Colectomy  01/25/03  . LEFT HEART CATH AND CORONARY ANGIOGRAPHY N/A 07/04/2017   Procedure: LEFT HEART CATH AND CORONARY ANGIOGRAPHY;  Surgeon: Leonie Man, MD;  Location: [facility];  Service: Cardiovascular;  Laterality: N/A;  . nasal polyp removal    . POLYPECTOMY    . TONSILLECTOMY  1952  . VIDEO BRONCHOSCOPY Bilateral 07/05/2017   Procedure: VIDEO BRONCHOSCOPY WITHOUT FLUORO;  Surgeon:  Collene Gobble, MD;  Location: [facility] ENDOSCOPY;  Service: Cardiopulmonary;  Laterality: Bilateral;  . VOCAL CORD INJECTION      Social History   Socioeconomic History  . Marital status: Divorced    Spouse name: Not on file  . Number of children: Not on file  . Years of education: Not on file  . Highest education level: Not on file  Occupational History  . Occupation: Retired    Comment: English Professor  Social Needs  . Financial resource strain: Not on file  . Food insecurity:    Worry: Not on file    Inability: Not on file  . Transportation  needs:    Medical: Not on file    Non-medical: Not on file  Tobacco Use  . Smoking status: Former Smoker    Last attempt to quit: 09/09/1977    Years since quitting: 40.5  . Smokeless tobacco: Never Used  . Tobacco comment: used to smoke socially. Quit in 1979.  Substance and Sexual Activity  . Alcohol use: Yes    Comment: usually has a glass of wine with dinner every evening  . Drug use: No  . Sexual activity: Not Currently    Birth control/protection: Other-see comments    Comment: HYST  Lifestyle  . Physical activity:    Days per week: Not on file    Minutes per session: Not on file  . Stress: Not on file  Relationships  . Social connections:    Talks on phone: Not on file    Gets together: Not on file    Attends religious service: Not on file    Active member of club or organization: Not on file    Attends meetings of clubs or organizations: Not on file    Relationship status: Not on file  Other Topics Concern  . Not on file  Social History Narrative  . Not on file    Family History  Problem Relation Age of Onset  . Hypertension Son   . Lung cancer Father   . Cancer Father   . Hypertension Father   . Liver cancer Mother   . Cancer Mother   . Hypertension Mother   . Colon cancer Mother   . Colon cancer Sister   . Heart murmur Son   . Stomach cancer Neg Hx   . Ulcerative colitis Neg Hx   . Allergic rhinitis  Neg Hx   . Angioedema Neg Hx   . Asthma Neg Hx     Review of Systems  Constitutional: Negative for chills and fever.  Respiratory: Positive for cough (chronic), chest tightness, shortness of breath (chronic) and wheezing.   Cardiovascular: Positive for palpitations (occasionally with activity or when she gets out of breath). Negative for chest pain and leg swelling.  Neurological: Positive for light-headedness and headaches.       Objective:   Vitals:   03/27/18 0815  BP: 104/68  Pulse: 87  Resp: 16  Temp: 97.6 F (36.4 C)  SpO2: 98%   BP Readings from Last 3 Encounters:  03/27/18 104/68  02/17/18 108/64  02/07/18 114/62   Wt Readings from Last 3 Encounters:  03/27/18 113 lb (51.3 kg)  02/17/18 114 lb (51.7 kg)  02/07/18 115 lb 6.4 oz (52.3 kg)   Body mass index is 21.35 kg/m.   Physical Exam    Constitutional: Appears well-developed and well-nourished. No distress.  HENT:  Head: Normocephalic and atraumatic.  Neck: Neck supple. No tracheal deviation present. No thyromegaly present.  No cervical lymphadenopathy Cardiovascular: Normal rate, regular rhythm and normal heart sounds.   No murmur heard. No carotid bruit .  No edema Pulmonary/Chest: Effort normal. No respiratory distress. occ expiratory wheeze. No rales.  Skin: Skin is warm and dry. Not diaphoretic.  Psychiatric: Normal mood and affect. Behavior is normal.      Assessment & Plan:    See Problem List for Assessment and Plan of chronic medical problems.

## 2018-03-27 ENCOUNTER — Other Ambulatory Visit: Payer: Self-pay | Admitting: Allergy and Immunology

## 2018-03-27 ENCOUNTER — Encounter: Payer: Self-pay | Admitting: Internal Medicine

## 2018-03-27 ENCOUNTER — Ambulatory Visit (INDEPENDENT_AMBULATORY_CARE_PROVIDER_SITE_OTHER): Payer: Medicare Other | Admitting: Internal Medicine

## 2018-03-27 VITALS — BP 104/68 | HR 87 | Temp 97.6°F | Resp 16 | Ht 61.0 in | Wt 113.0 lb

## 2018-03-27 DIAGNOSIS — C3490 Malignant neoplasm of unspecified part of unspecified bronchus or lung: Secondary | ICD-10-CM

## 2018-03-27 DIAGNOSIS — E876 Hypokalemia: Secondary | ICD-10-CM

## 2018-03-27 DIAGNOSIS — K219 Gastro-esophageal reflux disease without esophagitis: Secondary | ICD-10-CM | POA: Diagnosis not present

## 2018-03-27 DIAGNOSIS — I10 Essential (primary) hypertension: Secondary | ICD-10-CM

## 2018-03-27 DIAGNOSIS — F419 Anxiety disorder, unspecified: Secondary | ICD-10-CM | POA: Diagnosis not present

## 2018-03-27 MED ORDER — HYDROCODONE-HOMATROPINE 5-1.5 MG/5ML PO SYRP: 5.0000 mL | ORAL_SOLUTION | Freq: Three times a day (TID) | ORAL | 0 refills | Status: DC | PRN

## 2018-03-27 MED ORDER — OMEPRAZOLE 20 MG PO CPDR: DELAYED_RELEASE_CAPSULE | ORAL | 1 refills | Status: AC

## 2018-03-27 NOTE — Assessment & Plan Note (Signed)
Recent blood work reviewed Nurse, mental health con at current dose

## 2018-03-27 NOTE — Assessment & Plan Note (Signed)
following with oncology at [facility] and treatment this month

## 2018-03-27 NOTE — Assessment & Plan Note (Signed)
GERD controlled Continue daily medication  

## 2018-03-27 NOTE — Assessment & Plan Note (Signed)
She has been taking the full aldactone pill (25 mg ) -- decrease to 12.5 mg daily due to BP on the low side and occasional lightheadedness Continue coreg and lisinopril at current doses Had blood work done recently at [facility] - reviewed

## 2018-03-27 NOTE — Assessment & Plan Note (Signed)
Fairly controlled, stable Continue current dose of medication - ativan  She will let me know if her anxiety worsens so we can adjust her medication

## 2018-03-28 ENCOUNTER — Ambulatory Visit: Payer: Medicare Other | Admitting: Allergy and Immunology

## 2018-03-30 ENCOUNTER — Other Ambulatory Visit: Payer: Self-pay | Admitting: Allergy and Immunology

## 2018-04-03 ENCOUNTER — Encounter: Payer: Self-pay | Admitting: Allergy and Immunology

## 2018-04-03 ENCOUNTER — Ambulatory Visit: Payer: Medicare Other | Admitting: Allergy and Immunology

## 2018-04-03 VITALS — BP 94/62 | HR 93 | Resp 20 | Ht 61.0 in | Wt 112.8 lb

## 2018-04-03 DIAGNOSIS — J4530 Mild persistent asthma, uncomplicated: Secondary | ICD-10-CM

## 2018-04-03 DIAGNOSIS — J3089 Other allergic rhinitis: Secondary | ICD-10-CM | POA: Diagnosis not present

## 2018-04-03 DIAGNOSIS — J3801 Paralysis of vocal cords and larynx, unilateral: Secondary | ICD-10-CM

## 2018-04-03 NOTE — Assessment & Plan Note (Addendum)
   For now, continue Flovent 110 g, 2 inhalations via spacer device twice a day, and albuterol HFA every 6 hours as needed.  During respiratory tract infections or asthma flares, increase Flovent 110g to 3 inhalations 2 times per day until symptoms have returned to baseline.  Subjective and objective measures of pulmonary function will be followed and the treatment plan will be adjusted accordingly.

## 2018-04-03 NOTE — Assessment & Plan Note (Signed)
   Follow-up with Dr. Joya Gaskins as recommended.

## 2018-04-03 NOTE — Progress Notes (Signed)
Follow-up Note  RE: Amanda Jackson MRN: [id number] DOB: Apr 06, 1941 Date of Office Visit: 04/03/2018  Primary care provider: Binnie Rail, MD Referring provider: Binnie Rail, MD  History of present illness: Amanda Jackson is a 77 y.o. female with lung adenocarcinoma, asthma, chronic rhinitis, and vocal cord dysfunction presenting today for follow-up.  She was last seen in this clinic in June 2019.  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  She has continued to experience shortness of breath with exertion, particularly when walking up inclines or up stairs.  She has no nasal allergy symptom complaints today.  She needs refills and paperwork filled out for her disability car placard.  Assessment and plan: Mild persistent asthma  For now, continue Flovent 110 g, 2 inhalations via spacer device twice a day, and albuterol HFA every 6 hours as needed.  During respiratory tract infections or asthma flares, increase Flovent 110g to 3 inhalations 2 times per day until symptoms have returned to baseline.  Subjective and objective measures of pulmonary function will be followed and the treatment plan will be adjusted accordingly.  Paralysis of left vocal fold  Follow-up with Dr. Joya Gaskins as recommended.  Allergic rhinitis  Continue appropriate allergen avoidance measures and budesonide/saline nasal irrigation 1-2 times per day.    For thick post nasal drainage, add guaifenesin 600 mg (Mucinex)  twice daily as needed with adequate hydration as discussed.   Diagnostics: From tree reveals an FVC of with an FEV1 ratio of 89%.  Moderate restrictive/mild obstructive pattern.  Please see scanned spirometry results for details.    Physical examination: Blood pressure 94/62, pulse 93, resp. rate 20, height 5\' 1"  (1.549 m), weight 112 lb 12.8 oz (51.2 kg), SpO2 90 %.  General:  Alert, interactive, in no acute distress. HEENT: TMs pearly gray, turbinates minimally edematous with clear discharge, post-pharynx mildly erythematous. Neck: Supple without lymphadenopathy. Lungs: Mildly decreased breath sounds bilaterally without wheezing, rhonchi or rales. CV: Normal S1, S2 without murmurs. Skin: Warm and dry, without lesions or rashes.  The following portions of the patient's history were reviewed and updated as appropriate: allergies, current medications, past family history, past medical history, past social history, past surgical history and problem list.  Allergies as of 04/03/2018      Reactions   Tiotropium Swelling   Facial and lip swelling,       Medication List        Accurate as of 04/03/18 12:58 PM. Always use your most recent med list.          carvedilol 12.5 MG tablet Commonly known as:  COREG TAKE 1 TABLET BY MOUTH TWICE A DAY WITH A MEAL   dexamethasone 4 MG tablet Commonly known as:  DECADRON Take 4 mg by mouth daily as needed.   dronabinol 2.5 MG capsule Commonly known as:  MARINOL TAKE 1 CAPSULE BY MOUTH TWICE A DAY BEFORE MEALS   fluticasone 50 MCG/ACT nasal spray Commonly known as:  FLONASE Place into both nostrils daily.   HYDROcodone-homatropine 5-1.5 MG/5ML syrup Commonly known as:  HYCODAN Take 5 mLs by mouth every 6 (six) hours as needed for cough.   LORazepam 1 MG tablet Commonly known as:  ATIVAN TAKE 1 TABLET BY MOUTH TWICE A DAY   montelukast 10 MG tablet Commonly known as:  SINGULAIR TAKE 1 TABLET BY MOUTH EVERY DAY   multivitamin with  minerals Tabs tablet Take 1 tablet by mouth daily. Centrum   nitroGLYCERIN 0.4 MG SL tablet Commonly known as:  NITROSTAT Place 1 tablet (0.4 mg total) under the tongue every 5 (five) minutes as needed. For chest pain (patient unsure of MAX doses allowed)   omeprazole 20 MG capsule Commonly known as:  PRILOSEC TAKE 1 CAPSULE (20 MG TOTAL) BY MOUTH 2 (TWO) TIMES DAILY.     potassium chloride SA 20 MEQ tablet Commonly known as:  K-DUR,KLOR-CON Take 1 tablet (20 mEq total) by mouth daily. Please call and schedule an appointment for further refills 1st attempt   spironolactone 25 MG tablet Commonly known as:  ALDACTONE Take 0.5 tablets (12.5 mg total) by mouth daily. Please keep 01/11/17 appt for furhter refills   traMADol 50 MG tablet Commonly known as:  ULTRAM Take 50 mg by mouth daily as needed (knee pain). Reported on 07/23/2015   triamcinolone 0.1 % paste Commonly known as:  KENALOG Use as directed 1 application in the mouth or throat 2 (two) times daily.   triamcinolone cream 0.1 % Commonly known as:  KENALOG 1 APPLICATION TO AFFECTED AREA TWICE A DAY AS NEEDED FOR RED SPOTS ON BACK   TYLENOL ARTHRITIS PAIN 650 MG CR tablet Generic drug:  acetaminophen Take 650 mg by mouth every 8 (eight) hours as needed for pain.   VENTOLIN HFA 108 (90 Base) MCG/ACT inhaler Generic drug:  albuterol INHALE TWO PUFFS EVERY 4 HOURS AS NEEDED FOR COUGH OR WHEEZE.       Allergies  Allergen Reactions  . Tiotropium Swelling    Facial and lip swelling,     I appreciate the opportunity to take part in Amanda Jackson care. Please do not hesitate to contact me with questions.  Sincerely,   R. Edgar Frisk, MD

## 2018-04-03 NOTE — Patient Instructions (Addendum)
Mild persistent asthma  For now, continue Flovent 110 g, 2 inhalations via spacer device twice a day, and albuterol HFA every 6 hours as needed.  During respiratory tract infections or asthma flares, increase Flovent 110g to 3 inhalations 2 times per day until symptoms have returned to baseline.  Subjective and objective measures of pulmonary function will be followed and the treatment plan will be adjusted accordingly.  Paralysis of left vocal fold  Follow-up with Dr. Joya Gaskins as recommended.  Allergic rhinitis  Continue appropriate allergen avoidance measures and budesonide/saline nasal irrigation 1-2 times per day.    For thick post nasal drainage, add guaifenesin 600 mg (Mucinex)  twice daily as needed with adequate hydration as discussed.   Return in about 5 months (around 09/02/2018), or if symptoms worsen or fail to improve.

## 2018-04-03 NOTE — Assessment & Plan Note (Signed)
   Continue appropriate allergen avoidance measures and budesonide/saline nasal irrigation 1-2 times per day.    For thick post nasal drainage, add guaifenesin 600 mg (Mucinex)  twice daily as needed with adequate hydration as discussed.

## 2018-04-06 ENCOUNTER — Other Ambulatory Visit: Payer: Self-pay

## 2018-04-06 MED ORDER — FLUTICASONE PROPIONATE HFA 110 MCG/ACT IN AERO: 2.0000 | INHALATION_SPRAY | Freq: Two times a day (BID) | RESPIRATORY_TRACT | 5 refills | Status: AC

## 2018-04-14 ENCOUNTER — Other Ambulatory Visit: Payer: Self-pay | Admitting: Internal Medicine

## 2018-04-14 NOTE — Telephone Encounter (Signed)
Last OV 03/27/18  [place] Controlled Substance Database checked. Last filled on 03/13/18

## 2018-04-25 ENCOUNTER — Other Ambulatory Visit: Payer: Self-pay | Admitting: Allergy and Immunology

## 2018-04-30 ENCOUNTER — Other Ambulatory Visit: Payer: Self-pay | Admitting: Cardiology

## 2018-05-18 ENCOUNTER — Ambulatory Visit: Payer: Self-pay

## 2018-05-18 NOTE — Progress Notes (Signed)
Subjective:    Patient ID: Amanda Jackson, female    DOB: [birth year], 78 y.o.   MRN: [id number]  HPI The patient is here for an acute visit.   BP running low, she is on a new chemo drug and had chemo therapy yesterday.  When she was at that appointment her blood pressure was very low - 83/51.  Her oncologist advised her to follow up with me.  She has been feeling dizzy.  Her SOB has increased.   She uses her inhaler and it helps.  When she called here yesterday the nurse advised her not to take any blood pressure medication.  She has not taken it since yesterday morning.  She is starting to feel little bit better.  She was not dizzy this morning.    She had chest pain recently and took a nitro a few days ago.  She has not had chest pain in a while, but it is not new.  He occasionally has to take nitroglycerin and usually 1 pill resolves the chest pain.  Does not feel that her chest pain is increasing.  She is trying to drink and eat.  She has a decreased appetite and has not been eating as much, but her companion is trying to force her to eat.   Medications and allergies reviewed with patient and updated if appropriate.  Patient Active Problem List   Diagnosis Date Noted  . Bronchitis 02/17/2018  . Metastatic non-small cell lung cancer ([place]) 02/17/2018  . Lip/mouth sore 09/27/2017  . Knee osteoarthritis 09/23/2017  . Adenocarcinoma of lung ([place]) 07/18/2017  . NSTEMI (non-ST elevated myocardial infarction) ([place])   . Chest pain 07/01/2017  . 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 Hoarseness of voice 05/24/2017  . Migraine 01/11/2017  . Persistent cough 11/24/2015  . Vasovagal syncope 10/15/2015  . Hypokalemia 08/22/2015  . Syncope 07/30/2015  . History of nasal polyps 03/25/2015  . Leg pain, posterior 11/05/2013  . Mild persistent asthma 05/08/2013  . Loss of weight 12/05/2012  . Abnormal TSH 10/11/2012  . GERD (gastroesophageal reflux  disease) 04/12/2012  . Allergic rhinitis   . MVP (mitral valve prolapse)   . Tubulovillous adenoma   . Hyperlipidemia 07/20/2011  . Anxiety 04/12/2011  . Hepatic cyst 04/12/2011  . History of mitral valve prolapse 04/12/2011  . Essential hypertension 01/17/2009    Current Outpatient Medications on File Prior to Visit  Medication Sig Dispense Refill  . acetaminophen (TYLENOL ARTHRITIS PAIN) 650 MG CR tablet Take 650 mg by mouth every 8 (eight) hours as needed for pain.    . carvedilol (COREG) 12.5 MG tablet TAKE 1 TABLET BY MOUTH TWICE A DAY WITH A MEAL 180 tablet 3  . dronabinol (MARINOL) 2.5 MG capsule TAKE 1 CAPSULE BY MOUTH TWICE A DAY BEFORE MEALS  0  . fluticasone (FLONASE) 50 MCG/ACT nasal spray Place into both nostrils daily.    . fluticasone (FLOVENT HFA) 110 MCG/ACT inhaler Inhale 2 puffs into the lungs 2 (two) times daily. 1 Inhaler 5  . HYDROcodone-homatropine (HYCODAN) 5-1.5 MG/5ML syrup Take 5 mLs by mouth every 6 (six) hours as needed for cough.    Marland Kitchen LORazepam (ATIVAN) 1 MG tablet TAKE 1 TABLET BY MOUTH TWICE A DAY 60 tablet 1  . montelukast (SINGULAIR) 10 MG tablet TAKE 1 TABLET BY MOUTH EVERY DAY 30 tablet 5  . Multiple Vitamin (MULTIVITAMIN WITH MINERALS) TABS tablet Take 1 tablet by  mouth daily. Centrum    . nitroGLYCERIN (NITROSTAT) 0.4 MG SL tablet Place 1 tablet (0.4 mg total) under the tongue every 5 (five) minutes as needed. For chest pain (patient unsure of MAX doses allowed) (Patient taking differently: Place 0.4 mg under the tongue every 5 (five) minutes as needed for chest pain. ) 30 tablet 1  . omeprazole (PRILOSEC) 20 MG capsule TAKE 1 CAPSULE (20 MG TOTAL) BY MOUTH 2 (TWO) TIMES DAILY. 180 capsule 1  . potassium chloride SA (KLOR-CON M20) 20 MEQ tablet Take 1 tablet (20 mEq total) by mouth daily. Please call our office to schedule an overdue yearly appointment with Dr. Meda Coffee before anymore refills. [phone]. Thank you 2nd attempt 15 tablet 0  .  spironolactone (ALDACTONE) 25 MG tablet Take 0.5 tablets (12.5 mg total) by mouth daily. Please keep 01/11/17 appt for furhter refills 45 tablet 3  . traMADol (ULTRAM) 50 MG tablet Take 50 mg by mouth daily as needed (knee pain). Reported on 07/23/2015  0  . triamcinolone (KENALOG) 0.1 % paste Use as directed 1 application in the mouth or throat 2 (two) times daily. 5 g 4  . triamcinolone cream (KENALOG) 0.1 % 1 APPLICATION TO AFFECTED AREA TWICE A DAY AS NEEDED FOR RED SPOTS ON BACK  0  . VENTOLIN HFA 108 (90 Base) MCG/ACT inhaler INHALE TWO PUFFS EVERY 4 HOURS AS NEEDED FOR COUGH OR WHEEZE. 18 Inhaler 1   No current facility-administered medications on file prior to visit.     Past Medical History:  Diagnosis Date  . Allergy   . Anxiety   . Arthritis   . Asthma   . Atypical chest pain    Normal Myoview 2008  . Breast cancer ([place])   . Colon polyp   . GERD (gastroesophageal reflux disease)   . Hx of colonoscopy 03/18/09  . Hyperlipidemia   . Hypertension   . Left nasal polyps   . MVP (mitral valve prolapse)   . Seasonal allergies   . Tubulovillous adenoma     Past Surgical History:  Procedure Laterality Date  . ABDOMINAL HYSTERECTOMY    . BREAST SURGERY  2010   biopsy-benign  . BUNIONECTOMY    . COLONOSCOPY    . FUNCTIONAL ENDOSCOPIC SINUS SURGERY  08/28/08   with bilateral ethmoidectomies and bilateral maxillary sinus ostial enlargement with removal of mucocyst, mucous membrane, and mucoid material  . KNEE ARTHROSCOPY W/ LATERAL RELEASE  11/17/99   Left  . KNEE ARTHROSCOPY W/ PARTIAL MEDIAL MENISCECTOMY  11/17/99   left  . Laparoscopic Right Colectomy  01/25/03  . LEFT HEART CATH AND CORONARY ANGIOGRAPHY N/A 07/04/2017   Procedure: LEFT HEART CATH AND CORONARY ANGIOGRAPHY;  Surgeon: Leonie Man, MD;  Location: [facility];  Service: Cardiovascular;  Laterality: N/A;  . nasal polyp removal    . POLYPECTOMY    . TONSILLECTOMY  1952  . VIDEO BRONCHOSCOPY Bilateral  07/05/2017   Procedure: VIDEO BRONCHOSCOPY WITHOUT FLUORO;  Surgeon: Collene Gobble, MD;  Location: [facility] END[facility]COPY;  Service: Cardiopulmonary;  Laterality: Bilateral;  . VOCAL CORD INJECTION      Social History   Socioeconomic History  . Marital status: Divorced    Spouse name: Not on file  . Number of children: Not on file  . Years of education: Not on file  . Highest education level: Not on file  Occupational History  . Occupation: Retired    Comment: English Professor  Social Needs  . Emergency planning/management officer  strain: Not on file  . Food insecurity:    Worry: Not on file    Inability: Not on file  . Transportation needs:    Medical: Not on file    Non-medical: Not on file  Tobacco Use  . Smoking status: Former Smoker    Last attempt to quit: 09/09/1977    Years since quitting: 40.7  . Smokeless tobacco: Never Used  . Tobacco comment: used to smoke socially. Quit in 1979.  Substance and Sexual Activity  . Alcohol use: Yes    Comment: usually has a glass of wine with dinner every evening  . Drug use: No  . Sexual activity: Not Currently    Birth control/protection: Other-see comments    Comment: HYST  Lifestyle  . Physical activity:    Days per week: Not on file    Minutes per session: Not on file  . Stress: Not on file  Relationships  . Social connections:    Talks on phone: Not on file    Gets together: Not on file    Attends religious service: Not on file    Active member of club or organization: Not on file    Attends meetings of clubs or organizations: Not on file    Relationship status: Not on file  Other Topics Concern  . Not on file  Social History Narrative  . Not on file    Family History  Problem Relation Age of Onset  . Hypertension Son   . Lung cancer Father   . Cancer Father   . Hypertension Father   . Liver cancer Mother   . Cancer Mother   . Hypertension Mother   . Colon cancer Mother   . Colon cancer Sister   . Heart murmur Son   . Stomach  cancer Neg Hx   . Ulcerative colitis Neg Hx   . Allergic rhinitis Neg Hx   . Angioedema Neg Hx   . Asthma Neg Hx     Review of Systems  Respiratory: Positive for shortness of breath and wheezing. Negative for cough.   Cardiovascular: Positive for chest pain (one episode). Negative for leg swelling.  Neurological: Positive for light-headedness (none today) and headaches (less frequently).       Objective:   Vitals:   05/19/18 1039  BP: 98/62  Pulse: (!) 103  Resp: (!) 22  Temp: 97.6 F (36.4 C)  SpO2: 99%   BP Readings from Last 3 Encounters:  05/19/18 98/62  04/03/18 94/62  03/27/18 104/68   Wt Readings from Last 3 Encounters:  05/19/18 111 lb 12.8 oz (50.7 kg)  04/03/18 112 lb 12.8 oz (51.2 kg)  03/27/18 113 lb (51.3 kg)   Body mass index is 21.12 kg/m.   Physical Exam    Constitutional: Chronically ill-appearing. No distress.  HENT:  Head: Normocephalic and atraumatic.  Neck: Neck supple. No tracheal deviation present. No thyromegaly present.  No cervical lymphadenopathy Cardiovascular: Mild tachycardia, regular rhythm and normal heart sounds.    No edema Pulmonary/Chest: Effort normal and breath sounds normal.  Mild tachypnea with exertion. No has no wheezes. No rales.  Skin: Skin is warm and dry. Not diaphoretic.  Psychiatric: Normal mood and affect. Behavior is normal.       Assessment & Plan:    See Problem List for Assessment and Plan of chronic medical problems.

## 2018-05-18 NOTE — Telephone Encounter (Signed)
Returned call to patient who has been to [facility] today to see her oncologist.  She has been feeling dizzy since starting a new oral Chemo (Alecensa). At the office visit pt BP was found to be low. 83/51 standing with HR 66. He Oncologist stated that she should follow up with her PCP. Pt states she hasn't had a good appetite but she tries to eat what she can.  She states she sometimes chokes when drinking fluids. She is requesting advice on her BP medications. Per protocol office FC Gareth Eagle was consulted. Appointment scheduled for tomorrow. Pt request not to be sent to the ER. Care advice read to patient to include do not take BP medication until advised by Dr Quay Burow. Pt verbalized understanding. Pt agrees to go to ED for worsening of her symptoms.  Reason for Disposition . [0] Fall in systolic BP > 20 mm Hg from normal AND [2] dizzy, lightheaded, or weak  Answer Assessment - Initial Assessment Questions 1. BLOOD PRESSURE: "What is the blood pressure?" "Did you take at least two measurements 5 minutes apart?"     83/51 standing HR 66 2. ONSET: "When did you take your blood pressure?"   Chemo pills today 3. HOW: "How did you obtain the blood pressure?" (e.g., visiting nurse, automatic home BP monitor)    hospital 4. HISTORY: "Do you have a history of low blood pressure?" "What is your blood pressure normally?"     no 5. MEDICATIONS: "Are you taking any medications for blood pressure?" If yes: "Have they been changed recently?"    Form of Chemo has changed Biotherapy 6. PULSE RATE: "Do you know what your pulse rate is?"      66 7. OTHER SYMPTOMS: "Have you been sick recently?" "Have you had a recent injury?"     Lacks appitite dizziness 8. PREGNANCY: "Is there any chance you are pregnant?" "When was your last menstrual period?"     N/A  Protocols used: LOW BLOOD PRESSURE-A-AH

## 2018-05-19 ENCOUNTER — Encounter: Payer: Self-pay | Admitting: Internal Medicine

## 2018-05-19 ENCOUNTER — Ambulatory Visit: Payer: Medicare Other | Admitting: Internal Medicine

## 2018-05-19 VITALS — BP 98/62 | HR 103 | Temp 97.6°F | Resp 22 | Ht 61.0 in | Wt 111.8 lb

## 2018-05-19 DIAGNOSIS — R079 Chest pain, unspecified: Secondary | ICD-10-CM

## 2018-05-19 DIAGNOSIS — I10 Essential (primary) hypertension: Secondary | ICD-10-CM

## 2018-05-19 NOTE — Patient Instructions (Addendum)
Stop the carvedilol and spironolactone for now.     Continue the potassium.    Monitor your BP at home.   Call me with any questions or concerns.

## 2018-05-19 NOTE — Assessment & Plan Note (Signed)
Likely related to CAD  Has occasional CP - relieved with nitro x 1 If CP increases will need to see cardiology again

## 2018-05-19 NOTE — Telephone Encounter (Signed)
Pt seeing you today at 10:30.

## 2018-05-19 NOTE — Assessment & Plan Note (Signed)
BP low Stop coreg, spironolactone Monitor BP at home Increase fluids Call with questions

## 2018-05-27 ENCOUNTER — Other Ambulatory Visit: Payer: Self-pay | Admitting: Cardiology

## 2018-05-31 ENCOUNTER — Encounter: Payer: Self-pay | Admitting: Internal Medicine

## 2018-05-31 ENCOUNTER — Ambulatory Visit: Payer: Medicare Other | Admitting: Internal Medicine

## 2018-05-31 VITALS — BP 122/72 | HR 113 | Temp 98.4°F | Resp 20 | Ht 61.0 in | Wt 113.8 lb

## 2018-05-31 DIAGNOSIS — M79604 Pain in right leg: Secondary | ICD-10-CM | POA: Diagnosis not present

## 2018-05-31 DIAGNOSIS — E876 Hypokalemia: Secondary | ICD-10-CM

## 2018-05-31 DIAGNOSIS — M25471 Effusion, right ankle: Secondary | ICD-10-CM | POA: Insufficient documentation

## 2018-05-31 MED ORDER — POTASSIUM CHLORIDE CRYS ER 20 MEQ PO TBCR: 20.0000 meq | EXTENDED_RELEASE_TABLET | Freq: Every day | ORAL | 1 refills | Status: DC

## 2018-05-31 NOTE — Patient Instructions (Addendum)
Schedule a follow up with Dr Meda Coffee.    Your potassium prescription was sent to [facility].   Continue to elevate your legs at night and when sitting.   If your right leg symptoms get worse - discuss with Dr Fatima Sanger and come back if needed.

## 2018-05-31 NOTE — Assessment & Plan Note (Signed)
Not true pain, but more of a numbness/odd sensation that makes her think there is not enough circulation-no real pain or numbness-tingling It did improve with elevating her leg Possibly subtle lumbar radiculopathy Since her symptoms have improved we will not evaluate further She does have a lytic lesion at L3,?  Related to metastatic disease We will mention this to oncology

## 2018-05-31 NOTE — Assessment & Plan Note (Signed)
Will refill potassium She will call Dr. Francesca Oman office to schedule a follow-up appointment

## 2018-05-31 NOTE — Progress Notes (Signed)
Subjective:    Patient ID: Amanda Jackson, female    DOB: 08-May-1940, 78 y.o.   MRN: [id number]  HPI The patient is here for an acute visit.  Her right foot has been swollen for a few days.  She bumped it and that may have caused it - she is not sure. She slept with the leg elevated last night and the swelling improved. She denies current swelling.  She had some burning type pain in her foot at one point, but no other pain.  She has some tenderness on the lateral-anterior ankle.   Her right leg feels kind of numb but not numb at times- it makes her wonder if she has good blood supply to her leg.  She feels it more when sitting.  She has been trying to prop or elevate the leg and that helped.  She was sitting a lot reading.  She does have some chronic lower back pain, but denies radiating back down her leg.  Tylenol helps.     She has metastatic lung cancer and does have a lytic lesion on L3 and left pelvis.     She needs a refill of her potassium.   She needs to call and follow up with Dr Meda Coffee, which she plans on doing.    Medications and allergies reviewed with patient and updated if appropriate.  Patient Active Problem List   Diagnosis Date Noted  . Bronchitis 02/17/2018  . Metastatic non-small cell lung cancer ([place]) 02/17/2018  . Lip/mouth sore 09/27/2017  . Knee osteoarthritis 09/23/2017  . Adenocarcinoma of lung ([place]) 07/18/2017  . NSTEMI (non-ST elevated myocardial infarction) ([place])   . Chest pain 07/01/2017  . 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 Hoarseness of voice 05/24/2017  . Migraine 01/11/2017  . Persistent cough 11/24/2015  . Vasovagal syncope 10/15/2015  . Hypokalemia 08/22/2015  . Syncope 07/30/2015  . History of nasal polyps 03/25/2015  . Leg pain, posterior 11/05/2013  . Mild persistent asthma 05/08/2013  . Loss of weight 12/05/2012  . Abnormal TSH 10/11/2012  . GERD (gastroesophageal reflux disease) 04/12/2012  .  Allergic rhinitis   . MVP (mitral valve prolapse)   . Tubulovillous adenoma   . Hyperlipidemia 07/20/2011  . Anxiety 04/12/2011  . Hepatic cyst 04/12/2011  . History of mitral valve prolapse 04/12/2011  . Essential hypertension 01/17/2009    Current Outpatient Medications on File Prior to Visit  Medication Sig Dispense Refill  . acetaminophen (TYLENOL ARTHRITIS PAIN) 650 MG CR tablet Take 650 mg by mouth every 8 (eight) hours as needed for pain.    Marland Kitchen dronabinol (MARINOL) 2.5 MG capsule TAKE 1 CAPSULE BY MOUTH TWICE A DAY BEFORE MEALS  0  . fluticasone (FLONASE) 50 MCG/ACT nasal spray Place into both nostrils daily.    . fluticasone (FLOVENT HFA) 110 MCG/ACT inhaler Inhale 2 puffs into the lungs 2 (two) times daily. 1 Inhaler 5  . HYDROcodone-homatropine (HYCODAN) 5-1.5 MG/5ML syrup Take 5 mLs by mouth every 6 (six) hours as needed for cough.    Marland Kitchen LORazepam (ATIVAN) 1 MG tablet TAKE 1 TABLET BY MOUTH TWICE A DAY 60 tablet 1  . montelukast (SINGULAIR) 10 MG tablet TAKE 1 TABLET BY MOUTH EVERY DAY 30 tablet 5  . Multiple Vitamin (MULTIVITAMIN WITH MINERALS) TABS tablet Take 1 tablet by mouth daily. Centrum    . nitroGLYCERIN (NITROSTAT) 0.4 MG SL tablet Place 1 tablet (0.4 mg total) under the  tongue every 5 (five) minutes as needed. For chest pain (patient unsure of MAX doses allowed) (Patient taking differently: Place 0.4 mg under the tongue every 5 (five) minutes as needed for chest pain. ) 30 tablet 1  . omeprazole (PRILOSEC) 20 MG capsule TAKE 1 CAPSULE (20 MG TOTAL) BY MOUTH 2 (TWO) TIMES DAILY. 180 capsule 1  . traMADol (ULTRAM) 50 MG tablet Take 50 mg by mouth daily as needed (knee pain). Reported on 07/23/2015  0  . triamcinolone (KENALOG) 0.1 % paste Use as directed 1 application in the mouth or throat 2 (two) times daily. 5 g 4  . triamcinolone cream (KENALOG) 0.1 % 1 APPLICATION TO AFFECTED AREA TWICE A DAY AS NEEDED FOR RED SPOTS ON BACK  0  . VENTOLIN HFA 108 (90 Base) MCG/ACT  inhaler INHALE TWO PUFFS EVERY 4 HOURS AS NEEDED FOR COUGH OR WHEEZE. 18 Inhaler 1   No current facility-administered medications on file prior to visit.     Past Medical History:  Diagnosis Date  . Allergy   . Anxiety   . Arthritis   . Asthma   . Atypical chest pain    Normal Myoview 2008  . Breast cancer ([place])   . Colon polyp   . GERD (gastroesophageal reflux disease)   . Hx of colonoscopy 03/18/09  . Hyperlipidemia   . Hypertension   . Left nasal polyps   . MVP (mitral valve prolapse)   . Seasonal allergies   . Tubulovillous adenoma     Past Surgical History:  Procedure Laterality Date  . ABDOMINAL HYSTERECTOMY    . BREAST SURGERY  2010   biopsy-benign  . BUNIONECTOMY    . COLONOSCOPY    . FUNCTIONAL ENDOSCOPIC SINUS SURGERY  08/28/08   with bilateral ethmoidectomies and bilateral maxillary sinus ostial enlargement with removal of mucocyst, mucous membrane, and mucoid material  . KNEE ARTHROSCOPY W/ LATERAL RELEASE  11/17/99   Left  . KNEE ARTHROSCOPY W/ PARTIAL MEDIAL MENISCECTOMY  11/17/99   left  . Laparoscopic Right Colectomy  01/25/03  . LEFT HEART CATH AND CORONARY ANGIOGRAPHY N/A 07/04/2017   Procedure: LEFT HEART CATH AND CORONARY ANGIOGRAPHY;  Surgeon: Leonie Man, MD;  Location: [facility];  Service: Cardiovascular;  Laterality: N/A;  . nasal polyp removal    . POLYPECTOMY    . TONSILLECTOMY  1952  . VIDEO BRONCHOSCOPY Bilateral 07/05/2017   Procedure: VIDEO BRONCHOSCOPY WITHOUT FLUORO;  Surgeon: Collene Gobble, MD;  Location: [facility] ENDOSCOPY;  Service: Cardiopulmonary;  Laterality: Bilateral;  . VOCAL CORD INJECTION      Social History   Socioeconomic History  . Marital status: Divorced    Spouse name: Not on file  . Number of children: Not on file  . Years of education: Not on file  . Highest education level: Not on file  Occupational History  . Occupation: Retired    Comment: English Professor  Social Needs  . Financial resource strain:  Not on file  . Food insecurity:    Worry: Not on file    Inability: Not on file  . Transportation needs:    Medical: Not on file    Non-medical: Not on file  Tobacco Use  . Smoking status: Former Smoker    Last attempt to quit: 09/09/1977    Years since quitting: 40.7  . Smokeless tobacco: Never Used  . Tobacco comment: used to smoke socially. Quit in 1979.  Substance and Sexual Activity  . Alcohol use:  Yes    Comment: usually has a glass of wine with dinner every evening  . Drug use: No  . Sexual activity: Not Currently    Birth control/protection: Other-see comments    Comment: HYST  Lifestyle  . Physical activity:    Days per week: Not on file    Minutes per session: Not on file  . Stress: Not on file  Relationships  . Social connections:    Talks on phone: Not on file    Gets together: Not on file    Attends religious service: Not on file    Active member of club or organization: Not on file    Attends meetings of clubs or organizations: Not on file    Relationship status: Not on file  Other Topics Concern  . Not on file  Social History Narrative  . Not on file    Family History  Problem Relation Age of Onset  . Hypertension Son   . Lung cancer Father   . Cancer Father   . Hypertension Father   . Liver cancer Mother   . Cancer Mother   . Hypertension Mother   . Colon cancer Mother   . Colon cancer Sister   . Heart murmur Son   . Stomach cancer Neg Hx   . Ulcerative colitis Neg Hx   . Allergic rhinitis Neg Hx   . Angioedema Neg Hx   . Asthma Neg Hx     Review of Systems  Constitutional: Negative for fever.  Respiratory: Positive for cough and shortness of breath.   Cardiovascular: Positive for leg swelling.  Musculoskeletal: Positive for back pain.  Neurological: Negative for numbness.       Objective:   Vitals:   05/31/18 1318  BP: 122/72  Pulse: (!) 113  Resp: 20  Temp: 98.4 F (36.9 C)  SpO2: 93%   BP Readings from Last 3 Encounters:    05/31/18 122/72  05/19/18 98/62  04/03/18 94/62   Wt Readings from Last 3 Encounters:  05/31/18 113 lb 12.8 oz (51.6 kg)  05/19/18 111 lb 12.8 oz (50.7 kg)  04/03/18 112 lb 12.8 oz (51.2 kg)   Body mass index is 21.5 kg/m.   Physical Exam Constitutional:      General: She is not in acute distress.    Appearance: Normal appearance. She is not ill-appearing.     Comments: chronically ill appearing  HENT:     Head: Normocephalic and atraumatic.  Musculoskeletal:        General: Tenderness (minimal tenderness lateral right ankle - no increase pain with movmeent of ankle; no tenderness R  leg) present. No swelling, deformity or signs of injury.     Right lower leg: No edema.     Left lower leg: No edema.  Neurological:     Mental Status: She is alert.     Sensory: No sensory deficit.            Assessment & Plan:    See Problem List for Assessment and Plan of chronic medical problems.

## 2018-05-31 NOTE — Assessment & Plan Note (Signed)
Mild right ankle swelling She did bump her ankle so it may be posttraumatic Swelling improved after elevating her leg and she will continue to do so Monitor for now

## 2018-06-07 ENCOUNTER — Ambulatory Visit (INDEPENDENT_AMBULATORY_CARE_PROVIDER_SITE_OTHER)
Admission: RE | Admit: 2018-06-07 | Discharge: 2018-06-07 | Disposition: A | Discharge status: Home / Self Care | Payer: Medicare Other | Source: Ambulatory Visit | Attending: Physician Assistant | Admitting: Physician Assistant

## 2018-06-07 ENCOUNTER — Ambulatory Visit: Payer: Medicare Other | Admitting: Physician Assistant

## 2018-06-07 ENCOUNTER — Encounter: Payer: Self-pay | Admitting: Physician Assistant

## 2018-06-07 VITALS — BP 128/66 | HR 108 | Ht 61.0 in | Wt 111.8 lb

## 2018-06-07 DIAGNOSIS — I119 Hypertensive heart disease without heart failure: Secondary | ICD-10-CM

## 2018-06-07 DIAGNOSIS — I25118 Atherosclerotic heart disease of native coronary artery with other forms of angina pectoris: Secondary | ICD-10-CM | POA: Diagnosis not present

## 2018-06-07 DIAGNOSIS — R079 Chest pain, unspecified: Secondary | ICD-10-CM

## 2018-06-07 DIAGNOSIS — R0602 Shortness of breath: Secondary | ICD-10-CM

## 2018-06-07 DIAGNOSIS — I2510 Atherosclerotic heart disease of native coronary artery without angina pectoris: Secondary | ICD-10-CM | POA: Insufficient documentation

## 2018-06-07 MED ORDER — CARVEDILOL 12.5 MG PO TABS: 12.5000 mg | ORAL_TABLET | Freq: Two times a day (BID) | ORAL | 3 refills | Status: AC

## 2018-06-07 MED ORDER — IOPAMIDOL (ISOVUE-370) INJECTION 76%
75.0000 mL | Freq: Once | INTRAVENOUS | Status: AC | PRN
  Administered 2018-06-07: 75 mL via INTRAVENOUS

## 2018-06-07 NOTE — Progress Notes (Signed)
Cardiology Office Note    Date:  06/07/2018   ID:  Amanda Jackson, DOB 1940-05-13, MRN [id number]  PCP:  Binnie Rail, MD  Cardiologist: Ena Dawley, MD EPS: None  Chief Complaint  Patient presents with  . Chest Pain    History of Present Illness:  Amanda Jackson is a 78 y.o. female with history of heart disease without CHF.  Patient admitted with chest pain mild elevated troponin 06/2017 and underwent cardiac catheterization 07/04/2017 at which time she had a 45% proximal to mid LAD otherwise normal coronary arteries.  Medical therapy recommended 2D echo 3/19 normal LVEF 55 to 60% with grade 1 DD.  She was found to have a new lung mass which was felt to be because of her chest pain.  She also had a hypertensive heart disease and was treated with amlodipine, lisinopril and carvedilol.  She also has chronic anxiety.  Patient referred back by PCP Dr. Quay Burow for recurrent chest pain.  Patient being treated for terminal non small cell lung cancer at [facility] with chemo. She has a lot of anxiety surrounding this. Says her tumors are close to her heart. Has developed progressive dyspnea on exertion, bilateral foot swelling. Recently developed chest tightness & pressure relieved with NTG. Sunday pain eased in 15 min after 3 NTG. Labs 05/18/18 Crt 1.18, Hgb 10.1 Plts 418, thyroid studies normal. BP low and lisinopril, coreg 25 mg bid  And amlodipine were stopped.  Past Medical History:  Diagnosis Date  . Allergy   . Anxiety   . Arthritis   . Asthma   . Atypical chest pain    Normal Myoview 2008  . Breast cancer ([place])   . Colon polyp   . GERD (gastroesophageal reflux disease)   . Hx of colonoscopy 03/18/09  . Hyperlipidemia   . Hypertension   . Left nasal polyps   . MVP (mitral valve prolapse)   . Seasonal allergies   . Tubulovillous adenoma     Past Surgical History:  Procedure Laterality Date  . ABDOMINAL HYSTERECTOMY    . BREAST SURGERY  2010   biopsy-benign  .  BUNIONECTOMY    . COLONOSCOPY    . FUNCTIONAL ENDOSCOPIC SINUS SURGERY  08/28/08   with bilateral ethmoidectomies and bilateral maxillary sinus ostial enlargement with removal of mucocyst, mucous membrane, and mucoid material  . KNEE ARTHROSCOPY W/ LATERAL RELEASE  11/17/99   Left  . KNEE ARTHROSCOPY W/ PARTIAL MEDIAL MENISCECTOMY  11/17/99   left  . Laparoscopic Right Colectomy  01/25/03  . LEFT HEART CATH AND CORONARY ANGIOGRAPHY N/A 07/04/2017   Procedure: LEFT HEART CATH AND CORONARY ANGIOGRAPHY;  Surgeon: Leonie Man, MD;  Location: [facility];  Service: Cardiovascular;  Laterality: N/A;  . nasal polyp removal    . POLYPECTOMY    . TONSILLECTOMY  1952  . VIDEO BRONCHOSCOPY Bilateral 07/05/2017   Procedure: VIDEO BRONCHOSCOPY WITHOUT FLUORO;  Surgeon: Collene Gobble, MD;  Location: [facility] ENDOSCOPY;  Service: Cardiopulmonary;  Laterality: Bilateral;  . VOCAL CORD INJECTION      Current Medications: Current Meds  Medication Sig  . acetaminophen (TYLENOL ARTHRITIS PAIN) 650 MG CR tablet Take 650 mg by mouth every 8 (eight) hours as needed for pain.  Marland Kitchen amoxicillin-clavulanate (AUGMENTIN) 500-125 MG tablet Take 500 mg by mouth 3 (three) times daily. For 7 days.  Marland Kitchen dronabinol (MARINOL) 2.5 MG capsule TAKE 1 CAPSULE BY MOUTH TWICE A DAY BEFORE MEALS  . fluticasone (FLONASE) 50  MCG/ACT nasal spray Place into both nostrils daily.  . fluticasone (FLOVENT HFA) 110 MCG/ACT inhaler Inhale 2 puffs into the lungs 2 (two) times daily.  Marland Kitchen HYDROcodone-homatropine (HYCODAN) 5-1.5 MG/5ML syrup Take 5 mLs by mouth every 6 (six) hours as needed for cough.  Marland Kitchen LORazepam (ATIVAN) 1 MG tablet TAKE 1 TABLET BY MOUTH TWICE A DAY  . montelukast (SINGULAIR) 10 MG tablet TAKE 1 TABLET BY MOUTH EVERY DAY  . Multiple Vitamin (MULTIVITAMIN WITH MINERALS) TABS tablet Take 1 tablet by mouth daily. Centrum  . nitroGLYCERIN (NITROSTAT) 0.4 MG SL tablet Place 0.4 mg under the tongue every 5 (five) minutes as needed  for chest pain.  Marland Kitchen omeprazole (PRILOSEC) 20 MG capsule TAKE 1 CAPSULE (20 MG TOTAL) BY MOUTH 2 (TWO) TIMES DAILY.  Marland Kitchen potassium chloride SA (KLOR-CON M20) 20 MEQ tablet Take 1 tablet (20 mEq total) by mouth daily.  . traMADol (ULTRAM) 50 MG tablet Take 50 mg by mouth daily as needed (knee pain). Reported on 07/23/2015  . triamcinolone (KENALOG) 0.1 % paste Use as directed 1 application in the mouth or throat 2 (two) times daily.  Marland Kitchen triamcinolone cream (KENALOG) 0.1 % 1 APPLICATION TO AFFECTED AREA TWICE A DAY AS NEEDED FOR RED SPOTS ON BACK  . VENTOLIN HFA 108 (90 Base) MCG/ACT inhaler INHALE TWO PUFFS EVERY 4 HOURS AS NEEDED FOR COUGH OR WHEEZE.     Allergies:   Tiotropium   Social History   Socioeconomic History  . Marital status: Divorced    Spouse name: Not on file  . Number of children: Not on file  . Years of education: Not on file  . Highest education level: Not on file  Occupational History  . Occupation: Retired    Comment: English Professor  Social Needs  . Financial resource strain: Not on file  . Food insecurity:    Worry: Not on file    Inability: Not on file  . Transportation needs:    Medical: Not on file    Non-medical: Not on file  Tobacco Use  . Smoking status: Former Smoker    Last attempt to quit: 09/09/1977    Years since quitting: 40.7  . Smokeless tobacco: Never Used  . Tobacco comment: used to smoke socially. Quit in 1979.  Substance and Sexual Activity  . Alcohol use: Yes    Comment: usually has a glass of wine with dinner every evening  . Drug use: No  . Sexual activity: Not Currently    Birth control/protection: Other-see comments    Comment: HYST  Lifestyle  . Physical activity:    Days per week: Not on file    Minutes per session: Not on file  . Stress: Not on file  Relationships  . Social connections:    Talks on phone: Not on file    Gets together: Not on file    Attends religious service: Not on file    Active member of club or  organization: Not on file    Attends meetings of clubs or organizations: Not on file    Relationship status: Not on file  Other Topics Concern  . Not on file  Social History Narrative  . Not on file     Family History:  The patient's family history includes Cancer in her father and mother; Colon cancer in her mother and sister; Heart murmur in her son; Hypertension in her father, mother, and son; Liver cancer in her mother; Lung cancer in her father.  ROS:   Please see the history of present illness.    Review of Systems  Constitution: Negative.  HENT: Negative.   Eyes: Negative.   Cardiovascular: Positive for chest pain, dyspnea on exertion and leg swelling.  Respiratory: Positive for shortness of breath.   Hematologic/Lymphatic: Negative.   Musculoskeletal: Negative.  Negative for joint pain.  Gastrointestinal: Positive for abdominal pain.  Genitourinary: Negative.   Neurological: Negative.    All other systems reviewed and are negative.   PHYSICAL EXAM:   VS:  BP 128/66 (BP Location: Left Arm, Patient Position: Sitting, Cuff Size: Normal)   Pulse (!) 108   Ht 5\' 1"  (1.549 m)   Wt 111 lb 12.8 oz (50.7 kg)   BMI 21.12 kg/m   Physical Exam  GEN: Thin, short of breath, in no acute distress  Neck: no JVD, carotid bruits, or masses Cardiac:RRR at 110 bpm Respiratory: Decreased breath sounds throughout GI: soft, nontender, nondistended, + BS Ext: without cyanosis, clubbing, or edema, Good distal pulses bilaterally Neuro:  Alert and Oriented x 3 Psych: euthymic mood, full affect  Wt Readings from Last 3 Encounters:  06/07/18 111 lb 12.8 oz (50.7 kg)  05/31/18 113 lb 12.8 oz (51.6 kg)  05/19/18 111 lb 12.8 oz (50.7 kg)      Studies/Labs Reviewed:   EKG:  EKG is ordered today.  The ekg ordered today demonstrates sinus tachycardia at 108 bpm, no acute change  Recent Labs: 07/02/2017: TSH 1.863 07/06/2017: BUN 11; Creatinine, Ser 0.65; Potassium 3.5; Sodium  135 07/08/2017: Hemoglobin 11.7; Platelets 283   Lipid Panel    Component Value Date/Time   CHOL 140 02/03/2017 1126   TRIG 92.0 02/03/2017 1126   HDL 61.30 02/03/2017 1126   CHOLHDL 2 02/03/2017 1126   VLDL 18.4 02/03/2017 1126   LDLCALC 61 02/03/2017 1126    Additional studies/ records that were reviewed today include:   Cath 07/04/17:    Prox LAD to Mid LAD lesion is 45% stenosed. With otherwise normal coronary arteries.  LV end diastolic pressure is moderately elevated.  Normal LV function by Echocardiogram   Angiographically moderate disease in the mid LAD but otherwise no significant disease.   Very tortuous coronary arteries.  Mild to moderately elevated LVEDP. -->  Suggestive of Systemic Hypertension/Hypertensive Heart Disease.    Echo 07/02/17: Study Conclusions   - Left ventricle: The cavity size was normal. Wall thickness was   normal. Systolic function was normal. The estimated ejection   fraction was in the range of 55% to 60%. Wall motion was normal;   there were no regional wall motion abnormalities. Doppler   parameters are consistent with abnormal left ventricular   relaxation (grade 1 diastolic dysfunction). - Pulmonary arteries: Systolic pressure was mildly increased. PA   peak pressure: 32 mm Hg (S Left ventricle:  The cavity size was normal. Wall thickness was normal. Systolic function was normal. The estimated ejection fraction was in the range of 55% to 60%. Wall motion was normal; there were no regional wall motion abnormalities. Doppler parameters are consistent with abnormal left ventricular relaxation (grade 1 diastolic dysfunction).      ASSESSMENT:    1. Chest pain, unspecified type   2. SOB (shortness of breath)   3. Coronary artery disease involving native coronary artery of native heart with other form of angina pectoris ([place])   4. Hypertensive heart disease without CHF      PLAN:  In order of problems listed above:  Chest  pain and sinus tachycardia worrisome in a patient with metastatic lung CA.  O2 sats 92% at rest.  Patient's carvedilol was stopped because of hypotension as well as her amlodipine and lisinopril.  She does have a 45% LAD on cath 06/2017.  Will order CT to rule out PE.  If this is negative we will proceed with Lexiscan Myoview.start carvedilol 12.5 mg twice daily.Blood pressure today will allow this dose.  She was previously on 25 mg twice daily.  Chest pain could also be secondary to tachycardia.  She had this in the past in 06/2017 when her carvedilol was increased.  Shortness of breath patient has metastatic lung CA and is being followed closely at [facility] by oncology.  O2 sat is 92% at rest.  She may need home oxygen.  Ordering CT to rule out PE today GFR is 51 on 05/18/2018  History of CAD on cath 06/2017 with 45% LAD  Hypertensive heart disease all her medications were stopped because of low blood pressures.  Restarting carvedilol today because of tachycardia.  Blood pressure is stable today.   Medication Adjustments/Labs and Tests Ordered: Current medicines are reviewed at length with the patient today.  Concerns regarding medicines are outlined above.  Medication changes, Labs and Tests ordered today are listed in the Patient Instructions below. Patient Instructions  Medication Instructions:  Your physician has recommended you make the following change in your medication:   START: carvedilol 12.5 mg twice a day   Lab work: None Ordered  If you have labs (blood work) drawn today and your tests are completely normal, you will receive your results only by: Marland Kitchen MyChart Message (if you have MyChart) OR . A paper copy in the mail If you have any lab test that is abnormal or we need to change your treatment, we will call you to review the results.  Testing/Procedures: Your physician has requested that you have a lexiscan myoview. For further information please visit  [email or web address]. Please follow instruction sheet, as given.   Chest CT TODAY to rule out PE. Non-Cardiac CT scanning, (CAT scanning), is a noninvasive, special x-ray that produces cross-sectional images of the body using x-rays and a computer. CT scans help physicians diagnose and treat medical conditions. For some CT exams, a contrast material is used to enhance visibility in the area of the body being studied. CT scans provide greater clarity and reveal more details than regular x-ray exams.    Follow-Up: Follow up with Ermalinda Barrios, PA on 06/14/18 at 12:00 PM.  Any Other Special Instructions Will Be Listed Below (If Applicable).       Sumner Boast, PA-C  06/07/2018 2:43 PM    [facility] [place], [place], [place]  [address] Phone: [phone]; Fax: [phone]

## 2018-06-07 NOTE — Patient Instructions (Addendum)
Medication Instructions:  Your physician has recommended you make the following change in your medication:   START: carvedilol 12.5 mg twice a day   Lab work: None Ordered  If you have labs (blood work) drawn today and your tests are completely normal, you will receive your results only by: Marland Kitchen MyChart Message (if you have MyChart) OR . A paper copy in the mail If you have any lab test that is abnormal or we need to change your treatment, we will call you to review the results.  Testing/Procedures: Your physician has requested that you have a lexiscan myoview. For further information please visit [email or web address]. Please follow instruction sheet, as given.   Chest CT TODAY to rule out PE. Non-Cardiac CT scanning, (CAT scanning), is a noninvasive, special x-ray that produces cross-sectional images of the body using x-rays and a computer. CT scans help physicians diagnose and treat medical conditions. For some CT exams, a contrast material is used to enhance visibility in the area of the body being studied. CT scans provide greater clarity and reveal more details than regular x-ray exams.    Follow-Up: Follow up with Ermalinda Barrios, PA on 06/14/18 at 12:00 PM.  Any Other Special Instructions Will Be Listed Below (If Applicable).

## 2018-06-08 ENCOUNTER — Other Ambulatory Visit: Payer: Self-pay

## 2018-06-08 ENCOUNTER — Telehealth (HOSPITAL_COMMUNITY): Payer: Self-pay | Admitting: *Deleted

## 2018-06-08 ENCOUNTER — Encounter (HOSPITAL_COMMUNITY): Payer: Self-pay

## 2018-06-08 ENCOUNTER — Inpatient Hospital Stay (HOSPITAL_COMMUNITY)
Admission: EM | Admit: 2018-06-08 | Discharge: 2018-06-11 | DRG: 177 | Disposition: A | Discharge status: Home / Self Care | Payer: Medicare Other | Attending: Internal Medicine | Admitting: Internal Medicine

## 2018-06-08 DIAGNOSIS — E785 Hyperlipidemia, unspecified: Secondary | ICD-10-CM | POA: Diagnosis present

## 2018-06-08 DIAGNOSIS — J690 Pneumonitis due to inhalation of food and vomit: Principal | ICD-10-CM | POA: Diagnosis present

## 2018-06-08 DIAGNOSIS — M7989 Other specified soft tissue disorders: Secondary | ICD-10-CM | POA: Diagnosis present

## 2018-06-08 DIAGNOSIS — K219 Gastro-esophageal reflux disease without esophagitis: Secondary | ICD-10-CM | POA: Diagnosis present

## 2018-06-08 DIAGNOSIS — I341 Nonrheumatic mitral (valve) prolapse: Secondary | ICD-10-CM | POA: Diagnosis present

## 2018-06-08 DIAGNOSIS — J9601 Acute respiratory failure with hypoxia: Secondary | ICD-10-CM | POA: Diagnosis present

## 2018-06-08 DIAGNOSIS — J3801 Paralysis of vocal cords and larynx, unilateral: Secondary | ICD-10-CM | POA: Diagnosis present

## 2018-06-08 DIAGNOSIS — Z800 Family history of malignant neoplasm of digestive organs: Secondary | ICD-10-CM

## 2018-06-08 DIAGNOSIS — E876 Hypokalemia: Secondary | ICD-10-CM | POA: Diagnosis not present

## 2018-06-08 DIAGNOSIS — R1310 Dysphagia, unspecified: Secondary | ICD-10-CM | POA: Diagnosis present

## 2018-06-08 DIAGNOSIS — C3492 Malignant neoplasm of unspecified part of left bronchus or lung: Secondary | ICD-10-CM | POA: Diagnosis present

## 2018-06-08 DIAGNOSIS — Z8249 Family history of ischemic heart disease and other diseases of the circulatory system: Secondary | ICD-10-CM

## 2018-06-08 DIAGNOSIS — Z90710 Acquired absence of both cervix and uterus: Secondary | ICD-10-CM

## 2018-06-08 DIAGNOSIS — J90 Pleural effusion, not elsewhere classified: Secondary | ICD-10-CM | POA: Diagnosis present

## 2018-06-08 DIAGNOSIS — Z888 Allergy status to other drugs, medicaments and biological substances status: Secondary | ICD-10-CM

## 2018-06-08 DIAGNOSIS — C799 Secondary malignant neoplasm of unspecified site: Secondary | ICD-10-CM | POA: Diagnosis present

## 2018-06-08 DIAGNOSIS — F410 Panic disorder [episodic paroxysmal anxiety] without agoraphobia: Secondary | ICD-10-CM | POA: Diagnosis present

## 2018-06-08 DIAGNOSIS — Z9221 Personal history of antineoplastic chemotherapy: Secondary | ICD-10-CM

## 2018-06-08 DIAGNOSIS — J9691 Respiratory failure, unspecified with hypoxia: Secondary | ICD-10-CM

## 2018-06-08 DIAGNOSIS — C3490 Malignant neoplasm of unspecified part of unspecified bronchus or lung: Secondary | ICD-10-CM | POA: Diagnosis present

## 2018-06-08 DIAGNOSIS — Z7951 Long term (current) use of inhaled steroids: Secondary | ICD-10-CM

## 2018-06-08 DIAGNOSIS — I119 Hypertensive heart disease without heart failure: Secondary | ICD-10-CM | POA: Diagnosis present

## 2018-06-08 DIAGNOSIS — Z79899 Other long term (current) drug therapy: Secondary | ICD-10-CM

## 2018-06-08 DIAGNOSIS — D649 Anemia, unspecified: Secondary | ICD-10-CM | POA: Diagnosis present

## 2018-06-08 DIAGNOSIS — I2510 Atherosclerotic heart disease of native coronary artery without angina pectoris: Secondary | ICD-10-CM | POA: Diagnosis present

## 2018-06-08 DIAGNOSIS — F419 Anxiety disorder, unspecified: Secondary | ICD-10-CM | POA: Diagnosis present

## 2018-06-08 DIAGNOSIS — Z801 Family history of malignant neoplasm of trachea, bronchus and lung: Secondary | ICD-10-CM

## 2018-06-08 DIAGNOSIS — I10 Essential (primary) hypertension: Secondary | ICD-10-CM | POA: Diagnosis present

## 2018-06-08 LAB — LIPASE, BLOOD: Lipase: 20 U/L (ref 11–51)

## 2018-06-08 LAB — CBC
HCT: 28.5 % — ABNORMAL LOW (ref 36.0–46.0)
Hemoglobin: 8.9 g/dL — ABNORMAL LOW (ref 12.0–15.0)
MCH: 28.4 pg (ref 26.0–34.0)
MCHC: 31.2 g/dL (ref 30.0–36.0)
MCV: 91.1 fL (ref 80.0–100.0)
NRBC: 0 % (ref 0.0–0.2)
Platelets: 539 10*3/uL — ABNORMAL HIGH (ref 150–400)
RBC: 3.13 MIL/uL — AB (ref 3.87–5.11)
RDW: 20.2 % — ABNORMAL HIGH (ref 11.5–15.5)
WBC: 8 10*3/uL (ref 4.0–10.5)

## 2018-06-08 LAB — BASIC METABOLIC PANEL
Anion gap: 6 (ref 5–15)
BUN: 9 mg/dL (ref 8–23)
CHLORIDE: 106 mmol/L (ref 98–111)
CO2: 24 mmol/L (ref 22–32)
Calcium: 9.7 mg/dL (ref 8.9–10.3)
Creatinine, Ser: 0.59 mg/dL (ref 0.44–1.00)
GFR calc Af Amer: >60 mL/min (ref >60)
GFR calc non Af Amer: >60 mL/min (ref >60)
Glucose, Bld: 141 mg/dL — ABNORMAL HIGH (ref 70–99)
Potassium: 3.9 mmol/L (ref 3.5–5.1)
Sodium: 136 mmol/L (ref 135–145)

## 2018-06-08 LAB — I-STAT TROPONIN, ED: Troponin i, poc: 0.01 ng/mL (ref 0.00–0.08)

## 2018-06-08 LAB — HEPATIC FUNCTION PANEL
ALBUMIN: 2.7 g/dL — AB (ref 3.5–5.0)
ALT: 14 U/L (ref 0–44)
AST: 28 U/L (ref 15–41)
Alkaline Phosphatase: 128 U/L — ABNORMAL HIGH (ref 38–126)
Bilirubin, Direct: 0.3 mg/dL — ABNORMAL HIGH (ref 0.0–0.2)
Indirect Bilirubin: 0.4 mg/dL (ref 0.3–0.9)
TOTAL PROTEIN: 7.1 g/dL (ref 6.5–8.1)
Total Bilirubin: 0.7 mg/dL (ref 0.3–1.2)

## 2018-06-08 MED ORDER — PIPERACILLIN-TAZOBACTAM 3.375 G IVPB 30 MIN
3.3750 g | Freq: Once | INTRAVENOUS | Status: AC
  Administered 2018-06-08: 3.375 g via INTRAVENOUS
  Filled 2018-06-08: qty 50

## 2018-06-08 MED ORDER — BUDESONIDE 0.5 MG/2ML IN SUSP
0.5000 mg | Freq: Two times a day (BID) | RESPIRATORY_TRACT | Status: DC
  Administered 2018-06-08 – 2018-06-11 (×6): 0.5 mg via RESPIRATORY_TRACT
  Filled 2018-06-08 (×7): qty 2

## 2018-06-08 MED ORDER — ACETAMINOPHEN 325 MG PO TABS
650.0000 mg | ORAL_TABLET | Freq: Three times a day (TID) | ORAL | Status: DC
  Administered 2018-06-08 – 2018-06-11 (×7): 650 mg via ORAL
  Filled 2018-06-08 (×7): qty 2

## 2018-06-08 MED ORDER — PANTOPRAZOLE SODIUM 40 MG PO TBEC
40.0000 mg | DELAYED_RELEASE_TABLET | Freq: Two times a day (BID) | ORAL | Status: DC
  Administered 2018-06-08 – 2018-06-11 (×6): 40 mg via ORAL
  Filled 2018-06-08 (×6): qty 1

## 2018-06-08 MED ORDER — ALECTINIB HCL 150 MG PO CAPS
300.0000 mg | ORAL_CAPSULE | Freq: Two times a day (BID) | ORAL | Status: DC
  Administered 2018-06-08 – 2018-06-11 (×6): 300 mg via ORAL
  Filled 2018-06-08 (×6): qty 2

## 2018-06-08 MED ORDER — DRONABINOL 2.5 MG PO CAPS
2.5000 mg | ORAL_CAPSULE | Freq: Two times a day (BID) | ORAL | Status: DC
  Administered 2018-06-09 – 2018-06-11 (×5): 2.5 mg via ORAL
  Filled 2018-06-08 (×5): qty 1

## 2018-06-08 MED ORDER — ADULT MULTIVITAMIN W/MINERALS CH
1.0000 | ORAL_TABLET | Freq: Every day | ORAL | Status: DC
  Administered 2018-06-09 – 2018-06-11 (×3): 1 via ORAL
  Filled 2018-06-08 (×3): qty 1

## 2018-06-08 MED ORDER — ALBUTEROL SULFATE (2.5 MG/3ML) 0.083% IN NEBU: 2.5000 mg | INHALATION_SOLUTION | RESPIRATORY_TRACT | Status: DC | PRN

## 2018-06-08 MED ORDER — CARVEDILOL 12.5 MG PO TABS
12.5000 mg | ORAL_TABLET | Freq: Two times a day (BID) | ORAL | Status: DC
  Administered 2018-06-08 – 2018-06-11 (×6): 12.5 mg via ORAL
  Filled 2018-06-08 (×6): qty 1

## 2018-06-08 MED ORDER — ENOXAPARIN SODIUM 40 MG/0.4ML ~~LOC~~ SOLN
40.0000 mg | SUBCUTANEOUS | Status: DC
  Administered 2018-06-08 – 2018-06-10 (×3): 40 mg via SUBCUTANEOUS
  Filled 2018-06-08 (×3): qty 0.4

## 2018-06-08 MED ORDER — PIPERACILLIN-TAZOBACTAM 3.375 G IVPB
3.3750 g | Freq: Three times a day (TID) | INTRAVENOUS | Status: DC
  Administered 2018-06-09 – 2018-06-11 (×6): 3.375 g via INTRAVENOUS
  Filled 2018-06-08 (×8): qty 50

## 2018-06-08 MED ORDER — MONTELUKAST SODIUM 10 MG PO TABS
10.0000 mg | ORAL_TABLET | Freq: Every day | ORAL | Status: DC
  Administered 2018-06-08 – 2018-06-10 (×3): 10 mg via ORAL
  Filled 2018-06-08 (×3): qty 1

## 2018-06-08 MED ORDER — TRIAMCINOLONE ACETONIDE 0.1 % MT PSTE
1.0000 "application " | PASTE | Freq: Two times a day (BID) | OROMUCOSAL | Status: DC
  Administered 2018-06-08 – 2018-06-09 (×3): 1 via OROMUCOSAL
  Filled 2018-06-08: qty 5

## 2018-06-08 MED ORDER — SODIUM CHLORIDE 0.9% FLUSH
3.0000 mL | Freq: Once | INTRAVENOUS | Status: AC
  Administered 2018-06-08: 3 mL via INTRAVENOUS

## 2018-06-08 MED ORDER — FLUTICASONE PROPIONATE HFA 110 MCG/ACT IN AERO: 2.0000 | INHALATION_SPRAY | Freq: Two times a day (BID) | RESPIRATORY_TRACT | Status: DC

## 2018-06-08 MED ORDER — FLUTICASONE PROPIONATE 50 MCG/ACT NA SUSP
1.0000 | Freq: Two times a day (BID) | NASAL | Status: DC
  Administered 2018-06-08 – 2018-06-11 (×6): 1 via NASAL
  Filled 2018-06-08: qty 16

## 2018-06-08 MED ORDER — LORAZEPAM 1 MG PO TABS
1.0000 mg | ORAL_TABLET | Freq: Two times a day (BID) | ORAL | Status: DC
  Administered 2018-06-08 – 2018-06-11 (×6): 1 mg via ORAL
  Filled 2018-06-08: qty 2
  Filled 2018-06-08 (×5): qty 1

## 2018-06-08 MED ORDER — HYDROCODONE-HOMATROPINE 5-1.5 MG/5ML PO SYRP: 5.0000 mL | ORAL_SOLUTION | Freq: Four times a day (QID) | ORAL | Status: DC | PRN

## 2018-06-08 MED ORDER — NITROGLYCERIN 0.4 MG SL SUBL: 0.4000 mg | SUBLINGUAL_TABLET | SUBLINGUAL | Status: DC | PRN

## 2018-06-08 MED ORDER — TRAMADOL HCL 50 MG PO TABS: 50.0000 mg | ORAL_TABLET | Freq: Every day | ORAL | Status: DC | PRN

## 2018-06-08 MED ORDER — POTASSIUM CHLORIDE CRYS ER 20 MEQ PO TBCR
20.0000 meq | EXTENDED_RELEASE_TABLET | Freq: Every day | ORAL | Status: DC
  Administered 2018-06-09: 20 meq via ORAL
  Filled 2018-06-08: qty 1

## 2018-06-08 NOTE — Telephone Encounter (Signed)
Left message on voicemail per DPR in reference to upcoming appointment scheduled on 06/13/18 with detailed instructions given per Myocardial Perfusion Study Information Sheet for the test. LM to arrive 15 minutes early, and that it is imperative to arrive on time for appointment to keep from having the test rescheduled. If you need to cancel or reschedule your appointment, please call the office within 24 hours of your appointment. Failure to do so may result in a cancellation of your appointment, and a $50 no show fee. Phone number given for call back for any questions. Kirstie Peri

## 2018-06-08 NOTE — Progress Notes (Signed)
Pharmacy Antibiotic Note  Amanda Jackson is a 78 y.o. female admitted on 06/08/2018 with pneumonia.  Pharmacy has been consulted for Zosyn dosing.  Plan: Start Zosyn 3.375 gm IV q8h (4 hour infusion) Monitor clinical picture, renal function F/U C&S, abx deescalation / LOT   Height: 5\' 1"  (154.9 cm) Weight: 111 lb 15.9 oz (50.8 kg) IBW/kg (Calculated) : 47.8  Temp (24hrs), Avg:97.9 F (36.6 C), Min:97.9 F (36.6 C), Max:97.9 F (36.6 C)  Recent Labs  Lab 06/08/18 1548  WBC 8.0  CREATININE 0.59    Estimated Creatinine Clearance: 44.4 mL/min (by C-G formula based on SCr of 0.59 mg/dL).    Allergies  Allergen Reactions  . Tiotropium Swelling    Face and lips swell    Thank you for allowing pharmacy to be a part of this patient's care.  Reginia Naas 06/08/2018 6:32 PM

## 2018-06-08 NOTE — H&P (Addendum)
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 female with medical history significant for metastatic adenocarcinoma of the lung, CAD, hypertensive heart disease, grade 1 diastolic dysfunction who presented to the ED today with c/o progressive shortness of breath and chest pain.  History is as follows: Patient was admitted with chest pain in March 2019 and had a mildly elevated troponin.  Cardiac cath at that time showed 45% proximal to mid LAD stenosis, otherwise normal coronary arteries.  Medical therapy was recommended.  TTE at that time showed a normal EF of 55 to 60% with grade 1 diastolic dysfunction.  Around that time she was also found to have a new lung mass which was ultimately found to be non-small cell lung cancer.  This was felt to be the cause of her chest pain.  She has been treated with amlodipine, lisinopril and carvedilol for her hypertension.  She has been treated for her non-small cell lung cancer at [facility] with chemo.  Patient has baseline anxiety which has become acutely worse due to this new diagnosis.  She has developed worsening dyspnea on exertion, bilateral foot swelling.  This past week she developed some chest tightness and pressure which was relieved with nitroglycerin.  Blood pressure was found to be low and lisinopril, Coreg and amlodipine were discontinued.  She was seen in Dr. Bernerd Limbo clinic yesterday (cardiology) and was tachycardic, blood pressure was normal.  She was restarted on her Coreg at half her former dose, 12.5 mg twice daily.  O2 sats were 92% at rest.  CT of the chest was ordered to rule out PE.  There was no PE but did show a right lower lobe infiltrate which was consistent with aspiration pneumonia.  She was started on Augmentin yesterday but  since that time has not improved and actually feels worse today as far as her shortness of breath.  She has had a chronic cough for months but she is felt like she has had more sputum production over the last few days.  She is not had any measured fevers but did have an episode of diaphoresis this morning and feels that she probably has had a fever.  She said that this morning she was sitting down and became anxious due to her shortness of breath and her caregiver told her that she looked like she had a near syncopal episode where her eyes "looked out of it;" however she did not appear to actually lose consciousness and the episode only lasted a few seconds.  She has had no focal neurologic symptoms.  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  This was done at [facility].  She has had recurrent issues with dysphasia and occasional choking on her food.  She is supposed to have another procedure done on her right vocal cord on February 20.  ED Course: In the ED she was tachypneic, O2 sats ranged from 91 to 97% on room air at rest but dropped to mid to upper 80s with the slightest movement.  Blood pressure was within normal limits.  She was afebrile.  Labs were unremarkable other than chronic anemia.  CTA of the chest from yesterday was reviewed which was  concerning for aspiration pneumonia in the right lower lobe.  It was negative for PE.  She was started on Zosyn in the ED.  Review of Systems: As per HPI; she has also had some nonbloody diarrhea for the past couple of days; no abdominal pain, no emesis; otherwise review of systems reviewed and negative.   Ambulatory Status:  Ambulates without assistance  Past Medical History:  Diagnosis Date  . Allergy   . Anxiety   . Arthritis   . Asthma   . Atypical chest pain    Normal Myoview 2008  . Breast cancer ([place])   . Colon polyp   . GERD (gastroesophageal reflux disease)    . Hx of colonoscopy 03/18/09  . Hyperlipidemia   . Hypertension   . Left nasal polyps   . MVP (mitral valve prolapse)   . Seasonal allergies   . Tubulovillous adenoma     Past Surgical History:  Procedure Laterality Date  . ABDOMINAL HYSTERECTOMY    . BREAST SURGERY  2010   biopsy-benign  . BUNIONECTOMY    . COLONOSCOPY    . FUNCTIONAL ENDOSCOPIC SINUS SURGERY  08/28/08   with bilateral ethmoidectomies and bilateral maxillary sinus ostial enlargement with removal of mucocyst, mucous membrane, and mucoid material  . KNEE ARTHROSCOPY W/ LATERAL RELEASE  11/17/99   Left  . KNEE ARTHROSCOPY W/ PARTIAL MEDIAL MENISCECTOMY  11/17/99   left  . Laparoscopic Right Colectomy  01/25/03  . LEFT HEART CATH AND CORONARY ANGIOGRAPHY N/A 07/04/2017   Procedure: LEFT HEART CATH AND CORONARY ANGIOGRAPHY;  Surgeon: Leonie Man, MD;  Location: [facility];  Service: Cardiovascular;  Laterality: N/A;  . nasal polyp removal    . POLYPECTOMY    . TONSILLECTOMY  1952  . VIDEO BRONCHOSCOPY Bilateral 07/05/2017   Procedure: VIDEO BRONCHOSCOPY WITHOUT FLUORO;  Surgeon: Collene Gobble, MD;  Location: [facility] ENDOSCOPY;  Service: Cardiopulmonary;  Laterality: Bilateral;  . VOCAL CORD INJECTION      Social History   Socioeconomic History  . Marital status: Divorced    Spouse name: Not on file  . Number of children: Not on file  . Years of education: Not on file  . Highest education level: Not on file  Occupational History  . Occupation: Retired    Comment: English Professor  Social Needs  . Financial resource strain: Not on file  . Food insecurity:    Worry: Not on file    Inability: Not on file  . Transportation needs:    Medical: Not on file    Non-medical: Not on file  Tobacco Use  . Smoking status: Former Smoker    Last attempt to quit: 09/09/1977    Years since quitting: 40.7  . Smokeless tobacco: Never Used  . Tobacco comment: used to smoke socially. Quit in 1979.  Substance and  Sexual Activity  . Alcohol use: Yes    Comment: usually has a glass of wine with dinner every evening  . Drug use: No  . Sexual activity: Not Currently    Birth control/protection: Other-see comments    Comment: HYST  Lifestyle  . Physical activity:    Days per week: Not on file    Minutes per session: Not on file  . Stress: Not on file  Relationships  . Social connections:    Talks on phone: Not on file    Gets together: Not on file    Attends religious service: Not on file    Active  member of club or organization: Not on file    Attends meetings of clubs or organizations: Not on file    Relationship status: Not on file  . Intimate partner violence:    Fear of current or ex partner: Not on file    Emotionally abused: Not on file    Physically abused: Not on file    Forced sexual activity: Not on file  Other Topics Concern  . Not on file  Social History Narrative  . Not on file    Allergies  Allergen Reactions  . Tiotropium Swelling    Face and lips swell    Family History  Problem Relation Age of Onset  . Hypertension Son   . Lung cancer Father   . Cancer Father   . Hypertension Father   . Liver cancer Mother   . Cancer Mother   . Hypertension Mother   . Colon cancer Mother   . Colon cancer Sister   . Heart murmur Son   . Stomach cancer Neg Hx   . Ulcerative colitis Neg Hx   . Allergic rhinitis Neg Hx   . Angioedema Neg Hx   . Asthma Neg Hx     Prior to Admission medications   Medication Sig Start Date End Date Taking? Authorizing Provider  acetaminophen (TYLENOL ARTHRITIS PAIN) 650 MG CR tablet Take 650 mg by mouth every 8 (eight) hours as needed for pain.   Yes [provider]  alectinib (ALECENSA) 150 MG capsule Take 300 mg by mouth 2 (two) times daily with a meal.   Yes [provider]  amoxicillin-clavulanate (AUGMENTIN) 500-125 MG tablet Take 500 mg by mouth 3 (three) times daily. FOR 7 DAYS   Yes [provider]    carvedilol (COREG) 12.5 MG tablet Take 1 tablet (12.5 mg total) by mouth 2 (two) times daily. 06/07/18  Yes Imogene Burn, PA-C  dronabinol (MARINOL) 2.5 MG capsule Take 2.5 mg by mouth 2 (two) times daily before a meal.  09/06/17  Yes [provider]  fluticasone (FLONASE) 50 MCG/ACT nasal spray Place 1 spray into both nostrils 2 (two) times daily.    Yes [provider]  fluticasone (FLOVENT HFA) 110 MCG/ACT inhaler Inhale 2 puffs into the lungs 2 (two) times daily. 04/06/18  Yes Bobbitt, Sedalia Muta, MD  HYDROcodone-homatropine [facility]) 5-1.5 MG/5ML syrup Take 5 mLs by mouth every 6 (six) hours as needed for cough.   Yes [provider]  LORazepam (ATIVAN) 1 MG tablet TAKE 1 TABLET BY MOUTH TWICE A DAY Patient taking differently: Take 1 mg by mouth 2 (two) times daily.  04/14/18  Yes Burns, Claudina Lick, MD  montelukast (SINGULAIR) 10 MG tablet TAKE 1 TABLET BY MOUTH EVERY DAY Patient taking differently: Take 10 mg by mouth at bedtime.  04/25/18  Yes Bobbitt, Sedalia Muta, MD  Multiple Vitamins-Minerals (CENTRUM SILVER 50+WOMEN) TABS Take 1 tablet by mouth daily.   Yes [provider]  nitroGLYCERIN (NITROSTAT) 0.4 MG SL tablet Place 0.4 mg under the tongue every 5 (five) minutes as needed for chest pain.   Yes [provider]  omeprazole (PRILOSEC) 20 MG capsule TAKE 1 CAPSULE (20 MG TOTAL) BY MOUTH 2 (TWO) TIMES DAILY. Patient taking differently: Take 20 mg by mouth 2 (two) times daily.  03/27/18  Yes Burns, Claudina Lick, MD  potassium chloride SA (KLOR-CON M20) 20 MEQ tablet Take 1 tablet (20 mEq total) by mouth daily. 05/31/18  Yes Binnie Rail, MD  triamcinolone (KENALOG) 0.1 % paste Use as directed 1 application in the mouth or throat 2 (two) times daily. 09/27/17  Yes Bobbitt, Sedalia Muta, MD  triamcinolone cream (KENALOG) 0.1 % Apply 1 application topically See admin instructions. 1 APPLICATION TO AFFECTED AREA TWICE A DAY AS NEEDED FOR RED SPOTS ON  BACK 03/15/17  Yes [provider]  VENTOLIN HFA 108 (90 Base) MCG/ACT inhaler INHALE TWO PUFFS EVERY 4 HOURS AS NEEDED FOR COUGH OR WHEEZE. Patient taking differently: Inhale 2 puffs into the lungs every 4 (four) hours as needed for wheezing (or coughing).  03/30/18  Yes Bobbitt, Sedalia Muta, MD  traMADol (ULTRAM) 50 MG tablet Take 50 mg by mouth daily as needed (knee pain). Reported on 07/23/2015 10/18/14   [provider]    Physical Exam: Vitals:   06/08/18 1536 06/08/18 1546 06/08/18 1630  BP: (!) 145/76  117/85  Pulse: 92  91  Resp: 18  (!) 26  Temp: 97.9 F (36.6 C)    TempSrc: Oral    SpO2: 94%  95%  Weight:  50.8 kg   Height:  5\' 1"  (1.549 m)      . General: Appears calm and comfortable and is in NAD . Eyes:  PERRL, EOMI, normal lids, iris . ENT:  grossly normal hearing, lips & tongue, mmm . Neck:  supple, no lymphadenopathy . Cardiovascular:  nL S1, S2, normal rate, reg rhythm, no murmur. Marland Kitchen Respiratory: She is tachypneic.  She has decreased breath sounds at the left base.  Right lower lobe rales and rhonchi are present.  She has no wheezing. . Abdomen:  soft, NT, ND, NABS . Back:   grossly normal alignment . Skin:  no rash or lesions seen on limited exam . Musculoskeletal:  grossly normal tone BUE/BLE, good ROM, no bony abnormality or obvious joint deformity . Lower extremities: Trace LE edema.  Limited foot exam with no ulcerations.  2+ distal pulses. Marland Kitchen Psychiatric:  grossly normal mood and affect, speech fluent and appropriate, AOx3 . Neurologic:  CN 2-12 grossly intact, moves all extremities in coordinated fashion, sensation intact, Patellar DTRs 2+ and symmetric    Radiological Exams on Admission: Ct Angio Chest Pe W Or Wo Contrast  Result Date: 06/07/2018 CLINICAL DATA:  Chest pain, tachycardia, metastatic lung cancer, rule out the EXAM: CT ANGIOGRAPHY CHEST WITH CONTRAST TECHNIQUE: Multidetector CT imaging of the chest was performed using the  standard protocol during bolus administration of intravenous contrast. Multiplanar CT image reconstructions and MIPs were obtained to evaluate the vascular anatomy. CONTRAST:  75mL ISOVUE-370 IOPAMIDOL (ISOVUE-370) INJECTION 76% COMPARISON:  07/01/2017 FINDINGS: Cardiovascular: Satisfactory opacification of the pulmonary arteries to the segmental level. No evidence of pulmonary embolism. Normal heart size. No pericardial effusion. Mediastinum/Nodes: Abnormal soft tissue thickening about the left hilum, subcarinal station, and AP window. Thyroid gland, trachea, and esophagus demonstrate no significant findings. Lungs/Pleura: Moderate left pleural effusion with associated atelectasis or consolidation of the dependent left lung and masslike consolidation of the perihilar left lung and left lower lobe. There is interlobular septal thickening and nodularity of the left lung. There is clustered nodularity and minimal consolidation of the dependent right lung base with bronchial plugging, concerning for aspiration. There are additional scattered small nodules of the right lung. Upper Abdomen: Numerous bulky hypodense masses of the liver. Musculoskeletal: No chest wall abnormality. Lytic lesions involving the manubrium and sternal body (series 8, image 71). Review of the MIP images confirms the above findings. IMPRESSION: 1.  Negative examination  for pulmonary embolism. 2. Findings of advanced left lung malignancy including lymphangitic involvement of the left lung. Moderate left pleural effusion with associated atelectasis or consolidation of the dependent left lung and masslike consolidation of the perihilar left lung and left lower lobe. 3. There is clustered nodularity and minimal consolidation of the dependent right lung base with bronchial plugging, concerning for aspiration. 4. Evidence of distant metastatic disease including lytic lesions of the manubrium and sternal body as well as liver lesions. Electronically  Signed   By: Eddie Candle M.D.   On: 06/07/2018 16:23    EKG: Independently reviewed.  Rate 96, sinus rhythm with frequent premature ventricular complexes, septal infarct, age undetermined.  No significant change from prior.   Labs on Admission: I have personally reviewed the available labs and imaging studies at the time of the admission.  Pertinent labs:  CBC: White blood cells normal.  Chronic anemia at baseline. BMP unremarkable, creatinine 0.59 LFTs within normal limits with the exception of an elevated alkaline phosphatase at 128 and a mildly elevated direct bilirubin at 0.3; lipase normal; hypoalbuminemic at 2.7 Troponin 0 0.01   Assessment/Plan Principal Problem:   Aspiration pneumonia (HCC) Active Problems:   Essential hypertension   Anxiety   GERD (gastroesophageal reflux disease)   Paralysis of left vocal fold   Metastatic non-small cell lung cancer (HCC)   CAD (coronary artery disease)   Respiratory failure with hypoxia (HCC)    Aspiration pneumonia: Moderate to severe given her tachypnea and easy desaturations with minimal activity.  She is at higher risk for this given her vocal cord dysfunction and history of dysphasia.  Also with underlying advanced left lung malignancy with lymphangitic involvement, left pleural effusion. -Zosyn started in the ED.  Will continue. -De-escalate antibiotics as appropriate -Pulmonary toilet -Supplemental oxygen as needed -Proventil nebulizer every 4 hours as needed -Continue Pulmicort twice daily -Continue Singulair 10 mg daily -Hycodan cough syrup as needed -We will need ambulatory O2 sats prior to discharge to assess for need for home oxygen  Metastatic non-small cell lung cancer: -Continue alectinib daily, patient wishes to use her own supply to avoid extra cost. -Continue Marinol twice daily before meals  CAD: Stable.  Chest pain is pleuritic and reproducible on exam to some extent.  No evidence for acute coronary syndrome.   Last cardiac catheterization in March 2019 showed 45% proximal to mid LAD stenosis otherwise normal coronaries.  Continue medical therapy with beta-blocker.  Per Dr. Bernerd Limbo note from February 12, plan was to proceed with [facility] if CT was negative for PE.  Will defer this to outpatient setting.  Essential hypertension: Amlodipine and lisinopril recently discontinued due to hypotension.  Continue carvedilol at 12.5 mg twice daily.  GERD: -Continue PPI  Anxiety: -Continue home lorazepam as needed   DVT prophylaxis: Lovenox Code Status:  Full - confirmed with patient/family Family Communication: Significant other, Davis, at bedside Disposition Plan:  Home once clinically improved Consults called: none  Admission status: Admit - It is my clinical opinion that admission to INPATIENT is reasonable and necessary because of the expectation that this patient will require hospital care that crosses at least 2 midnights to treat this condition based on the medical complexity of the problems presented.  Given the aforementioned information, the predictability of an adverse outcome is felt to be significant.     Janora Norlander MD [facility]  If note is complete, please contact covering daytime or nighttime physician. [email or web address] Password [facility]  06/08/2018,  6:20 PM

## 2018-06-08 NOTE — ED Triage Notes (Signed)
Pt arrives POV for eval of CPx3 days. This AM had episode of sharp, epigastric/substernal chest pain w/ associated diaphoresis, near syncope and SOB. Pt reports episode spontaneously resolved. Reports CP on arrival to ED. Pt also reports worsening DOE x2 days. Hx of end stage lung CA per pt.

## 2018-06-08 NOTE — ED Notes (Signed)
ED Provider at bedside. 

## 2018-06-08 NOTE — ED Provider Notes (Signed)
[facility] EMERGENCY DEPARTMENT Provider Note   CSN: [id number] Arrival date & time: 06/08/18  1528     History   Chief Complaint Chief Complaint  Patient presents with  . Chest Pain    HPI Amanda Jackson is a 78 y.o. female with a hx of metastatic non-small cell lung cancer, GERD, HTN, hyperlipidemia, mitral valve prolapse, and CAD who presents to the ED with complaints of worsening dyspnea & right sided chest pain over past 1-2 weeks. Patient states she has had increased shortness of breath from her baseline, this is constant, worse with exertion & supine position, improved with rest. States she is having pain primarily to the R lower chest that is worse with coughing & w/ exertion as well. States she has been coughing up mucous sputum. Currently on augmentin has had 2 days of this abx. Notes that she saw cardiology yesterday for her sxs and had a CT scan.  Per chart review- CTA for PE, negative for PE, notable R lower lobe consistent w/ aspiration pneumonia- additional findings as below. Patient also notes this AM she was sitting at rest, became anxious, and her caregiver thought she had a near syncope episode where her eyes "looked out of it" no full LOC. Notes that she did restart her carvedilol yesterday, no other new meds. She has had some non bloody diarrhea since starting augmentin w/ mild epigastric discomfort as well. Denies vomiting, hematemesis, syncope, melena, hematochezia, or leg swelling.   HPI  Past Medical History:  Diagnosis Date  . Allergy   . Anxiety   . Arthritis   . Asthma   . Atypical chest pain    Normal Myoview 2008  . Breast cancer ([place])   . Colon polyp   . GERD (gastroesophageal reflux disease)   . Hx of colonoscopy 03/18/09  . Hyperlipidemia   . Hypertension   . Left nasal polyps   . MVP (mitral valve prolapse)   . Seasonal allergies   . Tubulovillous adenoma     Patient Active Problem List   Diagnosis Date Noted  . CAD  (coronary artery disease) 06/07/2018  . Right ankle swelling 05/31/2018  . Right leg pain 05/31/2018  . Bronchitis 02/17/2018  . Metastatic non-small cell lung cancer ([place]) 02/17/2018  . Lip/mouth sore 09/27/2017  . Knee osteoarthritis 09/23/2017  . Adenocarcinoma of lung ([place]) 07/18/2017  . NSTEMI (non-ST elevated myocardial infarction) ([place])   . Chest pain 07/01/2017  . 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 Hoarseness of voice 05/24/2017  . Migraine 01/11/2017  . Persistent cough 11/24/2015  . Vasovagal syncope 10/15/2015  . Hypokalemia 08/22/2015  . Syncope 07/30/2015  . History of nasal polyps 03/25/2015  . Leg pain, posterior 11/05/2013  . Mild persistent asthma 05/08/2013  . Loss of weight 12/05/2012  . Abnormal TSH 10/11/2012  . GERD (gastroesophageal reflux disease) 04/12/2012  . Allergic rhinitis   . MVP (mitral valve prolapse)   . Tubulovillous adenoma   . Hyperlipidemia 07/20/2011  . Anxiety 04/12/2011  . Hepatic cyst 04/12/2011  . History of mitral valve prolapse 04/12/2011  . Essential hypertension 01/17/2009    Past Surgical History:  Procedure Laterality Date  . ABDOMINAL HYSTERECTOMY    . BREAST SURGERY  2010   biopsy-benign  . BUNIONECTOMY    . COLONOSCOPY    . FUNCTIONAL ENDOSCOPIC SINUS SURGERY  08/28/08   with bilateral ethmoidectomies and bilateral maxillary sinus ostial enlargement with removal  of mucocyst, mucous membrane, and mucoid material  . KNEE ARTHROSCOPY W/ LATERAL RELEASE  11/17/99   Left  . KNEE ARTHROSCOPY W/ PARTIAL MEDIAL MENISCECTOMY  11/17/99   left  . Laparoscopic Right Colectomy  01/25/03  . LEFT HEART CATH AND CORONARY ANGIOGRAPHY N/A 07/04/2017   Procedure: LEFT HEART CATH AND CORONARY ANGIOGRAPHY;  Surgeon: Leonie Man, MD;  Location: [facility];  Service: Cardiovascular;  Laterality: N/A;  . nasal polyp removal    . POLYPECTOMY    . TONSILLECTOMY  1952  . VIDEO BRONCHOSCOPY  Bilateral 07/05/2017   Procedure: VIDEO BRONCHOSCOPY WITHOUT FLUORO;  Surgeon: Collene Gobble, MD;  Location: [facility] ENDOSCOPY;  Service: Cardiopulmonary;  Laterality: Bilateral;  . VOCAL CORD INJECTION       OB History    Gravida  2   Para  1   Term      Preterm      AB  1   Living  1     SAB      TAB      Ectopic      Multiple      Live Births               Home Medications    Prior to Admission medications   Medication Sig Start Date End Date Taking? Authorizing Provider  acetaminophen (TYLENOL ARTHRITIS PAIN) 650 MG CR tablet Take 650 mg by mouth every 8 (eight) hours as needed for pain.    [provider]  amoxicillin-clavulanate (AUGMENTIN) 500-125 MG tablet Take 500 mg by mouth 3 (three) times daily. For 7 days. 06/02/18 06/09/18  [provider]  carvedilol (COREG) 12.5 MG tablet Take 1 tablet (12.5 mg total) by mouth 2 (two) times daily. 06/07/18   Imogene Burn, PA-C  dronabinol (MARINOL) 2.5 MG capsule TAKE 1 CAPSULE BY MOUTH TWICE A DAY BEFORE MEALS 09/06/17   [provider]  fluticasone (FLONASE) 50 MCG/ACT nasal spray Place into both nostrils daily.    [provider]  fluticasone (FLOVENT HFA) 110 MCG/ACT inhaler Inhale 2 puffs into the lungs 2 (two) times daily. 04/06/18   Bobbitt, [place] Muta, MD  HYDROcodone-homatropine [facility]) 5-1.5 MG/5ML syrup Take 5 mLs by mouth every 6 (six) hours as needed for cough.    [provider]  LORazepam (ATIVAN) 1 MG tablet TAKE 1 TABLET BY MOUTH TWICE A DAY 04/14/18   Binnie Rail, MD  montelukast (SINGULAIR) 10 MG tablet TAKE 1 TABLET BY MOUTH EVERY DAY 04/25/18   Bobbitt, [place] Muta, MD  Multiple Vitamin (MULTIVITAMIN WITH MINERALS) TABS tablet Take 1 tablet by mouth daily. Centrum    [provider]  nitroGLYCERIN (NITROSTAT) 0.4 MG SL tablet Place 0.4 mg under the tongue every 5 (five) minutes as needed for chest pain.    [provider]    omeprazole (PRILOSEC) 20 MG capsule TAKE 1 CAPSULE (20 MG TOTAL) BY MOUTH 2 (TWO) TIMES DAILY. 03/27/18   Binnie Rail, MD  potassium chloride SA (KLOR-CON M20) 20 MEQ tablet Take 1 tablet (20 mEq total) by mouth daily. 05/31/18   Binnie Rail, MD  traMADol (ULTRAM) 50 MG tablet Take 50 mg by mouth daily as needed (knee pain). Reported on 07/23/2015 10/18/14   [provider]  triamcinolone (KENALOG) 0.1 % paste Use as directed 1 application in the mouth or throat 2 (two) times daily. 09/27/17   Bobbitt, [place] Muta, MD  triamcinolone cream (KENALOG) 0.1 % 1  APPLICATION TO AFFECTED AREA TWICE A DAY AS NEEDED FOR RED SPOTS ON BACK 03/15/17   [provider]  VENTOLIN HFA 108 (90 Base) MCG/ACT inhaler INHALE TWO PUFFS EVERY 4 HOURS AS NEEDED FOR COUGH OR WHEEZE. 03/30/18   Bobbitt, [place] Muta, MD    Family History Family History  Problem Relation Age of Onset  . Hypertension Son   . Lung cancer Father   . Cancer Father   . Hypertension Father   . Liver cancer Mother   . Cancer Mother   . Hypertension Mother   . Colon cancer Mother   . Colon cancer Sister   . Heart murmur Son   . Stomach cancer Neg Hx   . Ulcerative colitis Neg Hx   . Allergic rhinitis Neg Hx   . Angioedema Neg Hx   . Asthma Neg Hx     Social History Social History   Tobacco Use  . Smoking status: Former Smoker    Last attempt to quit: 09/09/1977    Years since quitting: 40.7  . Smokeless tobacco: Never Used  . Tobacco comment: used to smoke socially. Quit in 1979.  Substance Use Topics  . Alcohol use: Yes    Comment: usually has a glass of wine with dinner every evening  . Drug use: No     Allergies   Tiotropium   Review of Systems Review of Systems  Constitutional: Negative for chills and fever.  Respiratory: Positive for cough and shortness of breath.   Cardiovascular: Positive for chest pain. Negative for leg swelling.  Gastrointestinal: Positive for abdominal pain and diarrhea.  Negative for anal bleeding, blood in stool, constipation, nausea and vomiting.  Genitourinary: Negative for dysuria.  Neurological: Negative for syncope.  All other systems reviewed and are negative.    Physical Exam Updated Vital Signs BP (!) 145/76 (BP Location: Right Arm)   Pulse 92   Temp 97.9 F (36.6 C) (Oral)   Resp 18   Ht 5\' 1"  (1.549 m)   Wt 50.8 kg   SpO2 94%   BMI 21.16 kg/m   Physical Exam Vitals signs and nursing note reviewed.  Constitutional:      General: She is not in acute distress.    Appearance: She is well-developed. She is not toxic-appearing.  HENT:     Head: Normocephalic and atraumatic.  Eyes:     General:        Right eye: No discharge.        Left eye: No discharge.     Conjunctiva/sclera: Conjunctivae normal.  Neck:     Musculoskeletal: Neck supple.  Cardiovascular:     Rate and Rhythm: Normal rate and regular rhythm.  Pulmonary:     Effort: Tachypnea present.     Breath sounds: Examination of the left-middle field reveals decreased breath sounds. Examination of the right-lower field reveals rhonchi and rales. Examination of the left-lower field reveals decreased breath sounds. Decreased breath sounds, rhonchi and rales present.     Comments: SpO2 95% when sitting still on RA, with activity/movement on stretcher drops to upper 80s.  Chest:     Chest wall: No tenderness.  Abdominal:     General: Bowel sounds are normal. There is no distension.     Palpations: Abdomen is soft.     Tenderness: There is abdominal tenderness (mild epigastric). There is no guarding or rebound.  Skin:    General: Skin is warm and dry.     Findings: No rash.  Neurological:     Mental Status: She is alert.     Comments: Clear speech.   Psychiatric:        Behavior: Behavior normal.      ED Treatments / Results  Labs (all labs ordered are listed, but only abnormal results are displayed) Labs Reviewed  CBC - Abnormal; Notable for the following  components:      Result Value   RBC 3.13 (*)    Hemoglobin 8.9 (*)    HCT 28.5 (*)    RDW 20.2 (*)    Platelets 539 (*)    All other components within normal limits  BASIC METABOLIC PANEL    EKG EKG Interpretation  Date/Time:  Thursday June 08 2018 15:33:46 EST Ventricular Rate:  96 PR Interval:  122 QRS Duration: 70 QT Interval:  336 QTC Calculation: 424 R Axis:   60 Text Interpretation:  Sinus rhythm with frequent Premature ventricular complexes Septal infarct , age undetermined Abnormal ECG Otherwise no significant change Confirmed by Deno Etienne [phone]) on 06/08/2018 4:08:21 PM   Radiology Ct Angio Chest Pe W Or Wo Contrast  Result Date: 06/07/2018 CLINICAL DATA:  Chest pain, tachycardia, metastatic lung cancer, rule out the EXAM: CT ANGIOGRAPHY CHEST WITH CONTRAST TECHNIQUE: Multidetector CT imaging of the chest was performed using the standard protocol during bolus administration of intravenous contrast. Multiplanar CT image reconstructions and MIPs were obtained to evaluate the vascular anatomy. CONTRAST:  68mL ISOVUE-370 IOPAMIDOL (ISOVUE-370) INJECTION 76% COMPARISON:  07/01/2017 FINDINGS: Cardiovascular: Satisfactory opacification of the pulmonary arteries to the segmental level. No evidence of pulmonary embolism. Normal heart size. No pericardial effusion. Mediastinum/Nodes: Abnormal soft tissue thickening about the left hilum, subcarinal station, and AP window. Thyroid gland, trachea, and esophagus demonstrate no significant findings. Lungs/Pleura: Moderate left pleural effusion with associated atelectasis or consolidation of the dependent left lung and masslike consolidation of the perihilar left lung and left lower lobe. There is interlobular septal thickening and nodularity of the left lung. There is clustered nodularity and minimal consolidation of the dependent right lung base with bronchial plugging, concerning for aspiration. There are additional scattered small  nodules of the right lung. Upper Abdomen: Numerous bulky hypodense masses of the liver. Musculoskeletal: No chest wall abnormality. Lytic lesions involving the manubrium and sternal body (series 8, image 71). Review of the MIP images confirms the above findings. IMPRESSION: 1.  Negative examination for pulmonary embolism. 2. Findings of advanced left lung malignancy including lymphangitic involvement of the left lung. Moderate left pleural effusion with associated atelectasis or consolidation of the dependent left lung and masslike consolidation of the perihilar left lung and left lower lobe. 3. There is clustered nodularity and minimal consolidation of the dependent right lung base with bronchial plugging, concerning for aspiration. 4. Evidence of distant metastatic disease including lytic lesions of the manubrium and sternal body as well as liver lesions. Electronically Signed   By: Eddie Candle M.D.   On: 06/07/2018 16:23    Procedures Procedures (including critical care time)  Medications Ordered in ED Medications  sodium chloride flush (NS) 0.9 % injection 3 mL (has no administration in time range)     Initial Impression / Assessment and Plan / ED Course  I have reviewed the triage vital signs and the nursing notes.  Pertinent labs & imaging results that were available during my care of the patient were reviewed by me and considered in my medical decision making (see chart for details).   Patient presents to the  ED with worsening dyspnea & R lower chest pain. Nontoxic, noted to be tachypneic w/ desaturation with activity on stretcher. Lungs with decreased breath sounds to the L mid/lower region & rhonchi/rhales to R lower lobe. No wheezing.  CTA per day prior reviewed:  1.  Negative examination for pulmonary embolism. 2. Findings of advanced left lung malignancy including lymphangitic involvement of the left lung. Moderate left pleural effusion with associated atelectasis or consolidation of  the dependent left lung and masslike consolidation of the perihilar left lung and left lower lobe. 3. There is clustered nodularity and minimal consolidation of the dependent right lung base with bronchial plugging, concerning for aspiration. 4. Evidence of distant metastatic disease including lytic lesions of the manubrium and sternal body as well as liver lesions.  Labs reviewed:  CBC: No leukocytosis. Anemia noted.  BMP: No significant electrolyte disturbance  Hepatic function panel added: no significant abnormalities.  Lipase: WNL Troponin: negative EKG: No STEMI.   Patient with CTA findings concerning for aspiration pneumonia, she is having progressive worsening of her sxs on augmentin as an outpatient, with her tachypnea & desaturations with activity feel she warrants admission. Will start zosyn in the ER.  Consult placed to hospitalist service.   Discussed findings &  Plan of care with supervising physician Dr. Tyrone Nine who has personally evaluated patient & provided guidance in management & plan for admission.   18:17: CONSULT: Discussed case with hospitalist Dr. Steffanie Dunn who accepts admission.   Final Clinical Impressions(s) / ED Diagnoses   Final diagnoses:  Aspiration pneumonia of right lower lobe, unspecified aspiration pneumonia type [facility])    ED Discharge Orders    None       Amaryllis Dyke, PA-C 06/08/18 2059    Deno Etienne, DO 06/08/18 2225

## 2018-06-09 DIAGNOSIS — J690 Pneumonitis due to inhalation of food and vomit: Principal | ICD-10-CM

## 2018-06-09 LAB — BASIC METABOLIC PANEL
Anion gap: 6 (ref 5–15)
BUN: 12 mg/dL (ref 8–23)
CO2: 23 mmol/L (ref 22–32)
Calcium: 9.5 mg/dL (ref 8.9–10.3)
Chloride: 110 mmol/L (ref 98–111)
Creatinine, Ser: 0.68 mg/dL (ref 0.44–1.00)
GFR calc Af Amer: >60 mL/min (ref >60)
GFR calc non Af Amer: >60 mL/min (ref >60)
Glucose, Bld: 123 mg/dL — ABNORMAL HIGH (ref 70–99)
Potassium: 4.2 mmol/L (ref 3.5–5.1)
Sodium: 139 mmol/L (ref 135–145)

## 2018-06-09 LAB — HIV ANTIBODY (ROUTINE TESTING W REFLEX): HIV Screen 4th Generation wRfx: NONREACTIVE

## 2018-06-09 LAB — CBC
HCT: 25.8 % — ABNORMAL LOW (ref 36.0–46.0)
Hemoglobin: 8 g/dL — ABNORMAL LOW (ref 12.0–15.0)
MCH: 27.5 pg (ref 26.0–34.0)
MCHC: 31 g/dL (ref 30.0–36.0)
MCV: 88.7 fL (ref 80.0–100.0)
Platelets: 484 10*3/uL — ABNORMAL HIGH (ref 150–400)
RBC: 2.91 MIL/uL — ABNORMAL LOW (ref 3.87–5.11)
RDW: 19.5 % — ABNORMAL HIGH (ref 11.5–15.5)
WBC: 8.3 10*3/uL (ref 4.0–10.5)
nRBC: 0 % (ref 0.0–0.2)

## 2018-06-09 NOTE — Evaluation (Signed)
Physical Therapy Evaluation Patient Details Name: Amanda Jackson MRN: [id number] DOB: Feb 21, 1941 Today's Date: 06/09/2018   History of Present Illness  Patient is a 78 y/o female presenting to the ED on 06/08/2018 with primary complaints of SOB. Past medical history significant for metastatic adenocarcinoma of the lung, CAD, hypertensive heart disease, grade 1 diastolic dysfunction.  CT ruling out PE, admitted for Aspiration pneumonia.     Clinical Impression  Ms. Eskenazi is a very pleasant 78 y/o female admitted with the above listed diagnosis. Patient reports IND with mobility and ADLs prior to admission, with patient today requiring overall min guard for safety. Patient requiring 2L O2 via [place] throughout mobility with desat to 88% while ambulating in hallway - quick rebound to >/= 95% with seated rest break. Will currently recommend HHPT at discharge to ensure safe and independent functional mobility.     Follow Up Recommendations Home health PT;Supervision for mobility/OOB    Equipment Recommendations  None recommended by PT    Recommendations for Other Services       Precautions / Restrictions Precautions Precautions: Fall Restrictions Weight Bearing Restrictions: No      Mobility  Bed Mobility Overal bed mobility: Modified Independent                Transfers Overall transfer level: Needs assistance Equipment used: None Transfers: Sit to/from Stand Sit to Stand: Supervision;Min guard         General transfer comment: for safety  Ambulation/Gait Ambulation/Gait assistance: Min guard;Supervision Gait Distance (Feet): 100 Feet Assistive device: None Gait Pattern/deviations: Step-through pattern;Decreased stride length Gait velocity: decreased   General Gait Details: slow steady pace of gait; very focused on breathing with slight SOB; 2l via [place] with SpO2 dropping to 88% with mobility  Stairs            Wheelchair Mobility    Modified Rankin (Stroke  Patients Only)       Balance Overall balance assessment: Mild deficits observed, not formally tested                                           Pertinent Vitals/Pain Pain Assessment: No/denies pain    Home Living Family/patient expects to be discharged to:: Private residence Living Arrangements: Spouse/significant other Available Help at Discharge: Family;Friend(s);Available 24 hours/day Type of Home: House Home Access: Stairs to enter Entrance Stairs-Rails: Psychiatric nurse of Steps: 7 Home Layout: Two level Home Equipment: None      Prior Function Level of Independence: Independent               Hand Dominance        Extremity/Trunk Assessment   Upper Extremity Assessment Upper Extremity Assessment: Defer to OT evaluation    Lower Extremity Assessment Lower Extremity Assessment: Generalized weakness    Cervical / Trunk Assessment Cervical / Trunk Assessment: Normal  Communication   Communication: No difficulties  Cognition Arousal/Alertness: Awake/alert Behavior During Therapy: WFL for tasks assessed/performed Overall Cognitive Status: Within Functional Limits for tasks assessed                                        General Comments      Exercises     Assessment/Plan    PT Assessment Patient needs continued PT services  PT Problem List Decreased strength;Decreased activity tolerance;Decreased mobility;Decreased balance;Decreased safety awareness;Decreased knowledge of use of DME       PT Treatment Interventions DME instruction;Gait training;Stair training;Functional mobility training;Therapeutic activities;Therapeutic exercise;Balance training;Patient/family education    PT Goals (Current goals can be found in the Care Plan section)  Acute Rehab PT Goals Patient Stated Goal: regain strength, go home PT Goal Formulation: With patient Time For Goal Achievement: 06/23/18 Potential to Achieve  Goals: Good    Frequency Min 3X/week   Barriers to discharge        Co-evaluation               AM-PAC PT "6 Clicks" Mobility  Outcome Measure Help needed turning from your back to your side while in a flat bed without using bedrails?: None Help needed moving from lying on your back to sitting on the side of a flat bed without using bedrails?: None Help needed moving to and from a bed to a chair (including a wheelchair)?: A Little Help needed standing up from a chair using your arms (e.g., wheelchair or bedside chair)?: A Little Help needed to walk in hospital room?: A Little Help needed climbing 3-5 steps with a railing? : A Little 6 Click Score: 20    End of Session Equipment Utilized During Treatment: Gait belt;Oxygen Activity Tolerance: Patient tolerated treatment well;Patient limited by fatigue Patient left: in bed;with call bell/phone within reach(handoff to SLP) Nurse Communication: Mobility status PT Visit Diagnosis: Unsteadiness on feet (R26.81);Other abnormalities of gait and mobility (R26.89);Muscle weakness (generalized) (M62.81)    Time: 4720-7218 PT Time Calculation (min) (ACUTE ONLY): 24 min   Charges:   PT Evaluation $PT Eval Moderate Complexity: 1 Mod PT Treatments $Gait Training: 8-22 mins        Lanney Gins, PT, DPT Supplemental Physical Therapist 06/09/18 12:15 PM Pager: [phone] Office: [phone]

## 2018-06-09 NOTE — Progress Notes (Signed)
PROGRESS NOTE    Amanda Jackson  [id number] DOB: [birth year] DOA: 06/08/2018 PCP: Binnie Rail, MD     Brief Narrative:  Amanda Jackson is a 78 y.o. female with medical history significant for metastatic adenocarcinoma of the lung, CAD, hypertensive heart disease, grade 1 diastolic dysfunction who presented to the ED today with c/o progressive shortness of breath and chest pain.  She has been treated for her non-small cell lung cancer at [facility] with chemo.  CT of the chest was ordered to rule out PE.  There was no PE but did show a right lower lobe infiltrate which was consistent with aspiration pneumonia.  She was started on Augmentin prior to admission but since that time has not improved and actually feels worse. Patient also with procedure done April 2019 on her left vocal cord due to vocal cord paralysis.  This was done at [facility].  She has had recurrent issues with dysphasia and occasional choking on her food.  She is supposed to have another procedure done on her right vocal cord on February 20.  New events last 24 hours / Subjective: States her breathing is mildly better. She was wearing [place] on her nose but was not connected to O2 on the wall. She had no conversational dyspnea on examination. Continues to cough.   Assessment & Plan:   Principal Problem:   Aspiration pneumonia ([place]) Active Problems:   Essential hypertension   Anxiety   GERD (gastroesophageal reflux disease)   Paralysis of left vocal fold   Metastatic non-small cell lung cancer (HCC)   CAD (coronary artery disease)   Respiratory failure with hypoxia (HCC)   Aspiration pneumonia, higher risk for this given her vocal cord dysfunction and history of dysphasia -Continue IV zosyn  -SLP eval   Acute hypoxemic respiratory failure -Due to above. Wean O2, home desat screen   Metastatic non-small cell lung cancer -Continue alectinib daily, patient wishes to use her own supply to avoid extra cost.  Continue Marinol twice daily before meals  CAD -No chest pain today. Last cardiac catheterization in March 2019 showed 45% proximal to mid LAD stenosis otherwise normal coronaries.  Continue medical therapy with beta-blocker.  Per Dr. Bernerd Limbo note from February 12, plan was to proceed with [facility] if CT was negative for PE.  Will defer this to outpatient setting.  Essential hypertension -Amlodipine and lisinopril recently discontinued due to hypotension.  Continue carvedilol at 12.5 mg twice daily  GERD -Continue PPI  Anxiety -Continue home lorazepam as needed   DVT prophylaxis: Lovenox Code Status: Full Family Communication: No family at bedside Disposition Plan: Pending clinical improvement. PT OT SLP eval    Consultants:   None  Procedures:   None   Antimicrobials:  Anti-infectives (From admission, onward)   Start     Dose/Rate Route Frequency Ordered Stop   06/09/18 0100  piperacillin-tazobactam (ZOSYN) IVPB 3.375 g     3.375 g 12.5 mL/hr over 240 Minutes Intravenous Every 8 hours 06/08/18 1948     06/08/18 1845  piperacillin-tazobactam (ZOSYN) IVPB 3.375 g     3.375 g 100 mL/hr over 30 Minutes Intravenous  Once 06/08/18 1832 06/08/18 1942        Objective: Vitals:   06/08/18 2317 06/09/18 0305 06/09/18 0805 06/09/18 0819  BP: 129/62 (!) 106/93 127/76   Pulse: 99 88 85 84  Resp: 20 18 18 18   Temp: 98.3 F (36.8 C) 97.9 F (36.6 C) (!) 97.5 F (36.4  C)   TempSrc: Oral Oral Oral   SpO2: 100% 96% 99% 100%  Weight:      Height:        Intake/Output Summary (Last 24 hours) at 06/09/2018 1043 Last data filed at 06/09/2018 0600 Gross per 24 hour  Intake 240 ml  Output -  Net 240 ml   Filed Weights   06/08/18 1546  Weight: 50.8 kg    Examination:  General exam: Appears calm and comfortable  Respiratory system: Decreased breath sound on Left lower lobe.  Cardiovascular system: S1 & S2 heard, RRR. No JVD, murmurs, rubs, gallops or clicks.  No pedal edema. Gastrointestinal system: Abdomen is nondistended, soft and nontender. No organomegaly or masses felt. Normal bowel sounds heard. Central nervous system: Alert and oriented. No focal neurological deficits. Extremities: Symmetric 5 x 5 power. Skin: No rashes, lesions or ulcers Psychiatry: Judgement and insight appear normal. Mood & affect appropriate.   Data Reviewed: I have personally reviewed following labs and imaging studies  CBC: Recent Labs  Lab 06/08/18 1548 06/09/18 0255  WBC 8.0 8.3  HGB 8.9* 8.0*  HCT 28.5* 25.8*  MCV 91.1 88.7  PLT 539* 267*   Basic Metabolic Panel: Recent Labs  Lab 06/08/18 1548 06/09/18 0255  NA 136 139  K 3.9 4.2  CL 106 110  CO2 24 23  GLUCOSE 141* 123*  BUN 9 12  CREATININE 0.59 0.68  CALCIUM 9.7 9.5   GFR: Estimated Creatinine Clearance: 44.4 mL/min (by C-G formula based on SCr of 0.68 mg/dL). Liver Function Tests: Recent Labs  Lab 06/08/18 1705  AST 28  ALT 14  ALKPHOS 128*  BILITOT 0.7  PROT 7.1  ALBUMIN 2.7*   Recent Labs  Lab 06/08/18 1705  LIPASE 20   No results for input(s): AMMONIA in the last 168 hours. Coagulation Profile: No results for input(s): INR, PROTIME in the last 168 hours. Cardiac Enzymes: No results for input(s): CKTOTAL, CKMB, CKMBINDEX, TROPONINI in the last 168 hours. BNP (last 3 results) No results for input(s): PROBNP in the last 8760 hours. HbA1C: No results for input(s): HGBA1C in the last 72 hours. CBG: No results for input(s): GLUCAP in the last 168 hours. Lipid Profile: No results for input(s): CHOL, HDL, LDLCALC, TRIG, CHOLHDL, LDLDIRECT in the last 72 hours. Thyroid Function Tests: No results for input(s): TSH, T4TOTAL, FREET4, T3FREE, THYROIDAB in the last 72 hours. Anemia Panel: No results for input(s): VITAMINB12, FOLATE, FERRITIN, TIBC, IRON, RETICCTPCT in the last 72 hours. Sepsis Labs: No results for input(s): PROCALCITON, LATICACIDVEN in the last 168  hours.  No results found for this or any previous visit (from the past 240 hour(s)).     Radiology Studies: Ct Angio Chest Pe W Or Wo Contrast  Result Date: 06/07/2018 CLINICAL DATA:  Chest pain, tachycardia, metastatic lung cancer, rule out the EXAM: CT ANGIOGRAPHY CHEST WITH CONTRAST TECHNIQUE: Multidetector CT imaging of the chest was performed using the standard protocol during bolus administration of intravenous contrast. Multiplanar CT image reconstructions and MIPs were obtained to evaluate the vascular anatomy. CONTRAST:  75mL ISOVUE-370 IOPAMIDOL (ISOVUE-370) INJECTION 76% COMPARISON:  07/01/2017 FINDINGS: Cardiovascular: Satisfactory opacification of the pulmonary arteries to the segmental level. No evidence of pulmonary embolism. Normal heart size. No pericardial effusion. Mediastinum/Nodes: Abnormal soft tissue thickening about the left hilum, subcarinal station, and AP window. Thyroid gland, trachea, and esophagus demonstrate no significant findings. Lungs/Pleura: Moderate left pleural effusion with associated atelectasis or consolidation of the dependent left lung and masslike  consolidation of the perihilar left lung and left lower lobe. There is interlobular septal thickening and nodularity of the left lung. There is clustered nodularity and minimal consolidation of the dependent right lung base with bronchial plugging, concerning for aspiration. There are additional scattered small nodules of the right lung. Upper Abdomen: Numerous bulky hypodense masses of the liver. Musculoskeletal: No chest wall abnormality. Lytic lesions involving the manubrium and sternal body (series 8, image 71). Review of the MIP images confirms the above findings. IMPRESSION: 1.  Negative examination for pulmonary embolism. 2. Findings of advanced left lung malignancy including lymphangitic involvement of the left lung. Moderate left pleural effusion with associated atelectasis or consolidation of the dependent left  lung and masslike consolidation of the perihilar left lung and left lower lobe. 3. There is clustered nodularity and minimal consolidation of the dependent right lung base with bronchial plugging, concerning for aspiration. 4. Evidence of distant metastatic disease including lytic lesions of the manubrium and sternal body as well as liver lesions. Electronically Signed   By: Eddie Candle M.D.   On: 06/07/2018 16:23      Scheduled Meds: . acetaminophen  650 mg Oral Q8H  . alectinib  300 mg Oral BID WC  . budesonide (PULMICORT) nebulizer solution  0.5 mg Nebulization BID  . carvedilol  12.5 mg Oral BID  . dronabinol  2.5 mg Oral BID AC  . enoxaparin (LOVENOX) injection  40 mg Subcutaneous Q24H  . fluticasone  1 spray Each Nare BID  . LORazepam  1 mg Oral BID  . montelukast  10 mg Oral QHS  . multivitamin with minerals  1 tablet Oral Daily  . pantoprazole  40 mg Oral BID  . potassium chloride SA  20 mEq Oral Daily  . triamcinolone  1 application Mouth/Throat BID   Continuous Infusions: . piperacillin-tazobactam (ZOSYN)  IV 3.375 g (06/09/18 0943)     LOS: 1 day    Time spent: 40 minutes   Dessa Phi, DO [facility] [email or web address] 06/09/2018, 10:43 AM

## 2018-06-09 NOTE — Evaluation (Signed)
Clinical/Bedside Swallow Evaluation Patient Details  Name: BATYA CITRON MRN: [id number] Date of Birth: [birth year]  Today's Date: 06/09/2018 Time: SLP Start Time (ACUTE ONLY): 1205 SLP Stop Time (ACUTE ONLY): 1226 SLP Time Calculation (min) (ACUTE ONLY): 21 min  Past Medical History:  Past Medical History:  Diagnosis Date  . Allergy   . Anxiety   . Arthritis   . Asthma   . Atypical chest pain    Normal Myoview 2008  . Breast cancer ([place])   . Colon polyp   . GERD (gastroesophageal reflux disease)   . Hx of colonoscopy 03/18/09  . Hyperlipidemia   . Hypertension   . Left nasal polyps   . MVP (mitral valve prolapse)   . Seasonal allergies   . Tubulovillous adenoma    Past Surgical History:  Past Surgical History:  Procedure Laterality Date  . ABDOMINAL HYSTERECTOMY    . BREAST SURGERY  2010   biopsy-benign  . BUNIONECTOMY    . COLONOSCOPY    . FUNCTIONAL ENDOSCOPIC SINUS SURGERY  08/28/08   with bilateral ethmoidectomies and bilateral maxillary sinus ostial enlargement with removal of mucocyst, mucous membrane, and mucoid material  . KNEE ARTHROSCOPY W/ LATERAL RELEASE  11/17/99   Left  . KNEE ARTHROSCOPY W/ PARTIAL MEDIAL MENISCECTOMY  11/17/99   left  . Laparoscopic Right Colectomy  01/25/03  . LEFT HEART CATH AND CORONARY ANGIOGRAPHY N/A 07/04/2017   Procedure: LEFT HEART CATH AND CORONARY ANGIOGRAPHY;  Surgeon: Leonie Man, MD;  Location: [facility];  Service: Cardiovascular;  Laterality: N/A;  . nasal polyp removal    . POLYPECTOMY    . TONSILLECTOMY  1952  . VIDEO BRONCHOSCOPY Bilateral 07/05/2017   Procedure: VIDEO BRONCHOSCOPY WITHOUT FLUORO;  Surgeon: Collene Gobble, MD;  Location: [facility];  Service: Cardiopulmonary;  Laterality: Bilateral;  . VOCAL CORD INJECTION     HPI:  Pt is a 78 yo female admitted with CP and RLL PNA. She has a h/o L vocal cord paralysis s/p injection. Another procedure was planned for later this month. Per MD H&P note she  has frequent coughing with PO intake. PMH also includes: metastatic [place] lung ca, CAD, HTN, GERD, anxiety   Assessment / Plan / Recommendation Clinical Impression  Pt has mild dysphonia, intermittently sounding raspy or strained in phonation. Her initial sip of thin liquid elicited multiple swallows and pt report of it "not going down." Otherwise, intermittent throat clearing was noted across lunch tray but no other overt signs of aspiration. She describes her dysphagia as difficulty with solids > liquids and reduced appetite, but she does cough when she feels like things are "stuck." Given her reported h/o GERD as well, some of her symptoms sound like they could be more esophageal in nature. Given h/o vocal paralysis and current RLL PNA, recommend proceeding with instrumental testing. Given her suspected esophageal component and the fact that she has had endoscopy with ENT (and has f/u with ENT already planned), would favor MBS. Per radiology, this can be scheduled on the next date at the earliest. Would continue current diet with precautions pending completion. SLP Visit Diagnosis: Dysphagia, unspecified (R13.10)    Aspiration Risk  Mild aspiration risk;Moderate aspiration risk    Diet Recommendation Regular;Thin liquid   Liquid Administration via: Cup;Straw Medication Administration: Whole meds with liquid Supervision: Patient able to self feed;Intermittent supervision to cue for compensatory strategies Compensations: Slow rate;Small sips/bites;Follow solids with liquid Postural Changes: Seated upright at 90 degrees;Remain upright for  at least 30 minutes after po intake    Other  Recommendations Oral Care Recommendations: Oral care BID   Follow up Recommendations (tba)      Frequency and Duration            Prognosis Prognosis for Safe Diet Advancement: Good      Swallow Study   General HPI: Pt is a 78 yo female admitted with CP and RLL PNA. She has a h/o L vocal cord paralysis s/p  injection. Another procedure was planned for later this month. Per MD H&P note she has frequent coughing with PO intake. PMH also includes: metastatic [place] lung ca, CAD, HTN, GERD, anxiety Type of Study: Bedside Swallow Evaluation Previous Swallow Assessment: none in chart Diet Prior to this Study: Regular;Thin liquids Temperature Spikes Noted: No Respiratory Status: Nasal cannula History of Recent Intubation: No Behavior/Cognition: Alert;Cooperative;Pleasant mood Oral Care Completed by SLP: No Oral Cavity - Dentition: Adequate natural dentition Vision: Functional for self-feeding Self-Feeding Abilities: Able to feed self Patient Positioning: Upright in bed Baseline Vocal Quality: Other (comment)(mildly dysphonic)    Oral/Motor/Sensory Function Overall Oral Motor/Sensory Function: (appears functional)   Ice Chips Ice chips: Not tested   Thin Liquid Thin Liquid: Impaired Presentation: Self Fed;Straw Pharyngeal  Phase Impairments: Multiple swallows;Other (comments)(pt said first sip "wouldn't go down")    Nectar Thick Nectar Thick Liquid: Not tested   Honey Thick Honey Thick Liquid: Not tested   Puree Puree: Within functional limits Presentation: Self Fed   Solid     Solid: Impaired Presentation: Self Fed Pharyngeal Phase Impairments: Throat Clearing - Delayed      Venita Sheffield Aki Burdin 06/09/2018,1:21 PM  Nuala Alpha, M.A. [place] Acute Environmental education officer [phone] Office [phone]

## 2018-06-10 ENCOUNTER — Inpatient Hospital Stay (HOSPITAL_COMMUNITY): Payer: Medicare Other

## 2018-06-10 LAB — BASIC METABOLIC PANEL
Anion gap: 8 (ref 5–15)
BUN: 16 mg/dL (ref 8–23)
CO2: 24 mmol/L (ref 22–32)
Calcium: 9.3 mg/dL (ref 8.9–10.3)
Chloride: 109 mmol/L (ref 98–111)
Creatinine, Ser: 0.68 mg/dL (ref 0.44–1.00)
GFR calc Af Amer: >60 mL/min (ref >60)
GFR calc non Af Amer: >60 mL/min (ref >60)
Glucose, Bld: 92 mg/dL (ref 70–99)
POTASSIUM: 3.4 mmol/L — AB (ref 3.5–5.1)
Sodium: 141 mmol/L (ref 135–145)

## 2018-06-10 LAB — CBC
HCT: 24.2 % — ABNORMAL LOW (ref 36.0–46.0)
Hemoglobin: 7.4 g/dL — ABNORMAL LOW (ref 12.0–15.0)
MCH: 27.3 pg (ref 26.0–34.0)
MCHC: 30.6 g/dL (ref 30.0–36.0)
MCV: 89.3 fL (ref 80.0–100.0)
Platelets: 470 10*3/uL — ABNORMAL HIGH (ref 150–400)
RBC: 2.71 MIL/uL — ABNORMAL LOW (ref 3.87–5.11)
RDW: 19.1 % — ABNORMAL HIGH (ref 11.5–15.5)
WBC: 7.8 10*3/uL (ref 4.0–10.5)
nRBC: 0 % (ref 0.0–0.2)

## 2018-06-10 MED ORDER — POTASSIUM CHLORIDE CRYS ER 20 MEQ PO TBCR
40.0000 meq | EXTENDED_RELEASE_TABLET | Freq: Once | ORAL | Status: AC
  Administered 2018-06-10: 40 meq via ORAL
  Filled 2018-06-10: qty 2

## 2018-06-10 MED ORDER — HYDROXYZINE HCL 25 MG PO TABS: 25.0000 mg | ORAL_TABLET | Freq: Four times a day (QID) | ORAL | Status: DC | PRN

## 2018-06-10 NOTE — Evaluation (Signed)
Occupational Therapy Evaluation Patient Details Name: Amanda Jackson MRN: [id number] DOB: 09/11/1940 Today's Date: 06/10/2018    History of Present Illness Patient is a 78 y/o female presenting to the ED on 06/08/2018 with primary complaints of SOB. Past medical history significant for metastatic adenocarcinoma of the lung, CAD, hypertensive heart disease, grade 1 diastolic dysfunction.  CT ruling out PE, admitted for Aspiration pneumonia.    Clinical Impression   PTA Pt independent in ADL and mobility. Pt is currently overall min guard for ADL (transfers, sink level grooming, LB dressing). Pt demonstrating decreased activity tolerance, generalized weakness, and SOB during functional activity (O2 levels WFL on .5L O2). Pt will benefit from skilled OT in the acute setting to maximize safety and independence in ADL and functional transfers with focus on energy conservation next session.     Follow Up Recommendations  No OT follow up    Equipment Recommendations  3 in 1 bedside commode(as shower seat)    Recommendations for Other Services       Precautions / Restrictions Precautions Precautions: Fall Precaution Comments: watch O2 Restrictions Weight Bearing Restrictions: No      Mobility Bed Mobility Overal bed mobility: Modified Independent                Transfers Overall transfer level: Needs assistance Equipment used: None Transfers: Sit to/from Stand Sit to Stand: Supervision;Min guard         General transfer comment: for safety    Balance Overall balance assessment: Mild deficits observed, not formally tested                                         ADL either performed or assessed with clinical judgement   ADL Overall ADL's : Needs assistance/impaired Eating/Feeding: Modified independent   Grooming: Min guard;Standing;Wash/dry hands;Wash/dry face;Oral care Grooming Details (indicate cue type and reason): sink level, no LOB Upper Body  Bathing: Sitting;Supervision/ safety   Lower Body Bathing: Supervison/ safety;Sit to/from stand   Upper Body Dressing : Modified independent   Lower Body Dressing: Minimal assistance;Sit to/from stand   Toilet Transfer: Min guard;Ambulation Toilet Transfer Details (indicate cue type and reason): no DME, does reach out a little for support from solid surfaces Toileting- Clothing Manipulation and Hygiene: Min guard;Sit to/from stand       Functional mobility during ADLs: Min guard General ADL Comments: decreased activity tolerance     Vision Patient Visual Report: No change from baseline       Perception     Praxis      Pertinent Vitals/Pain Pain Assessment: Faces Pain Score: 0-No pain Faces Pain Scale: No hurt Pain Intervention(s): Monitored during session     Hand Dominance     Extremity/Trunk Assessment Upper Extremity Assessment Upper Extremity Assessment: Overall WFL for tasks assessed   Lower Extremity Assessment Lower Extremity Assessment: Defer to PT evaluation   Cervical / Trunk Assessment Cervical / Trunk Assessment: Normal   Communication Communication Communication: No difficulties   Cognition Arousal/Alertness: Awake/alert Behavior During Therapy: WFL for tasks assessed/performed Overall Cognitive Status: Within Functional Limits for tasks assessed                                     General Comments  Pt on .5L O2 throughout session. Stayed at 92% or above  throughout    Exercises     Shoulder Instructions      Home Living Family/patient expects to be discharged to:: Private residence Living Arrangements: Spouse/significant other Available Help at Discharge: Family;Friend(s);Available 24 hours/day Type of Home: House Home Access: Stairs to enter [employer] of Steps: 7 Entrance Stairs-Rails: Right;Left Home Layout: Two level Alternate Level Stairs-Number of Steps: 12 Alternate Level Stairs-Rails:  Right Bathroom Shower/Tub: Occupational psychologist: Standard     Home Equipment: None          Prior Functioning/Environment Level of Independence: Independent                 OT Problem List: Decreased activity tolerance      OT Treatment/Interventions: Self-care/ADL training;Energy conservation;Therapeutic activities;Patient/family education    OT Goals(Current goals can be found in the care plan section) Acute Rehab OT Goals Patient Stated Goal: regain strength, go home OT Goal Formulation: With patient Time For Goal Achievement: 06/24/18 Potential to Achieve Goals: Good ADL Goals Pt Will Perform Grooming: with modified independence;standing Pt Will Perform Upper Body Bathing: with modified independence;standing Pt Will Perform Lower Body Bathing: with modified independence;sit to/from stand Pt Will Transfer to Toilet: with modified independence;ambulating Pt Will Perform Toileting - Clothing Manipulation and hygiene: with modified independence;sit to/from stand Pt Will Perform Tub/Shower Transfer: Shower transfer;with modified independence;ambulating Additional ADL Goal #1: Pt will recall 3 ways of conserving energy during ADL at independent level  OT Frequency: Min 2X/week   Barriers to D/C:            Co-evaluation              AM-PAC OT "6 Clicks" Daily Activity     Outcome Measure Help from another person eating meals?: None Help from another person taking care of personal grooming?: A Little Help from another person toileting, which includes using toliet, bedpan, or urinal?: A Little Help from another person bathing (including washing, rinsing, drying)?: A Little Help from another person to put on and taking off regular upper body clothing?: None Help from another person to put on and taking off regular lower body clothing?: A Little 6 Click Score: 20   End of Session Equipment Utilized During Treatment: Gait belt Nurse Communication:  Mobility status  Activity Tolerance: Patient tolerated treatment well Patient left: in chair;with call bell/phone within reach;with nursing/sitter in room  OT Visit Diagnosis: Unsteadiness on feet (R26.81);Muscle weakness (generalized) (M62.81)                Time: 1710-1735 OT Time Calculation (min): 25 min Charges:  OT General Charges $OT Visit: 1 Visit OT Evaluation $OT Eval Moderate Complexity: 1 Mod OT Treatments $Self Care/Home Management : 8-22 mins  Hulda Humphrey OTR/L Acute Rehabilitation Services Pager: [phone] Office: [phone]  Amanda Jackson 06/10/2018, 5:43 PM

## 2018-06-10 NOTE — Progress Notes (Addendum)
PROGRESS NOTE    Amanda Jackson  [id number] DOB: 18-Mar-1941 DOA: 06/08/2018 PCP: Binnie Rail, MD     Brief Narrative:  Amanda Jackson is a 78 y.o. female with medical history significant for metastatic adenocarcinoma of the lung, CAD, hypertensive heart disease, grade 1 diastolic dysfunction who presented to the ED today with c/o progressive shortness of breath and chest pain.  She has been treated for her non-small cell lung cancer at [facility] with chemo.  CT of the chest was ordered to rule out PE.  There was no PE but did show a right lower lobe infiltrate which was consistent with aspiration pneumonia.  She was started on Augmentin prior to admission but since that time has not improved and actually feels worse. Patient also with procedure done April 2019 on her left vocal cord due to vocal cord paralysis.  This was done at [facility].  She has had recurrent issues with dysphasia and occasional choking on her food.  She is supposed to have another procedure done on her right vocal cord on February 20.  New events last 24 hours / Subjective: Anxiety attacks with SOB, better when she is reminded to take in slow breaths. She states that she takes ativan BID which helps.   Assessment & Plan:   Principal Problem:   Aspiration pneumonia ([place]) Active Problems:   Essential hypertension   Anxiety   GERD (gastroesophageal reflux disease)   Paralysis of left vocal fold   Metastatic non-small cell lung cancer (HCC)   CAD (coronary artery disease)   Respiratory failure with hypoxia (HCC)   Aspiration pneumonia, higher risk for this given her vocal cord dysfunction and history of dysphasia -Continue IV zosyn  -SLP eval, planning MBS   Acute hypoxemic respiratory failure -Due to above. Wean O2, home desat screen   Metastatic non-small cell lung cancer -Continue alectinib daily, patient wishes to use her own supply to avoid extra cost. Continue Marinol twice daily before  meals  CAD -No chest pain today. Last cardiac catheterization in March 2019 showed 45% proximal to mid LAD stenosis otherwise normal coronaries.  Continue medical therapy with beta-blocker.  Per Dr. Bernerd Limbo note from February 12, plan was to proceed with [facility] if CT was negative for PE.  Will defer this to outpatient setting.  Essential hypertension -Amlodipine and lisinopril recently discontinued due to hypotension.  Continue carvedilol at 12.5 mg twice daily  GERD -Continue PPI  Anxiety -Continue home lorazepam BID. Will add atarax prn   Hypokalemia -Replace, trend    DVT prophylaxis: Lovenox Code Status: Full Family Communication: At bedside  Disposition Plan: DC to home with home health. Wean O2 and home desat screen to determine home O2 needs. Hopeful DC home 2/16 if stable    Consultants:   None  Procedures:   None   Antimicrobials:  Anti-infectives (From admission, onward)   Start     Dose/Rate Route Frequency Ordered Stop   06/09/18 0100  piperacillin-tazobactam (ZOSYN) IVPB 3.375 g     3.375 g 12.5 mL/hr over 240 Minutes Intravenous Every 8 hours 06/08/18 1948     06/08/18 1845  piperacillin-tazobactam (ZOSYN) IVPB 3.375 g     3.375 g 100 mL/hr over 30 Minutes Intravenous  Once 06/08/18 1832 06/08/18 1942       Objective: Vitals:   06/09/18 2050 06/09/18 2319 06/10/18 0804 06/10/18 0949  BP:  109/68 123/64   Pulse:  79 79 89  Resp:   17  18  Temp:  98.1 F (36.7 C) 98.4 F (36.9 C)   TempSrc:  Oral Oral   SpO2: 95% 98% 93% 98%  Weight:      Height:        Intake/Output Summary (Last 24 hours) at 06/10/2018 1032 Last data filed at 06/10/2018 0600 Gross per 24 hour  Intake 719.95 ml  Output -  Net 719.95 ml   Filed Weights   06/08/18 1546  Weight: 50.8 kg    Examination: General exam: Appears calm and comfortable  Respiratory system: Diminished  Cardiovascular system: S1 & S2 heard, RRR. No JVD, murmurs, rubs, gallops or  clicks. No pedal edema. Gastrointestinal system: Abdomen is nondistended, soft and nontender. No organomegaly or masses felt. Normal bowel sounds heard. Central nervous system: Alert and oriented. No focal neurological deficits. Extremities: Symmetric 5 x 5 power. Skin: No rashes, lesions or ulcers Psychiatry: Judgement and insight appear normal. Mood & affect appropriate.    Data Reviewed: I have personally reviewed following labs and imaging studies  CBC: Recent Labs  Lab 06/08/18 1548 06/09/18 0255 06/10/18 0224  WBC 8.0 8.3 7.8  HGB 8.9* 8.0* 7.4*  HCT 28.5* 25.8* 24.2*  MCV 91.1 88.7 89.3  PLT 539* 484* 793*   Basic Metabolic Panel: Recent Labs  Lab 06/08/18 1548 06/09/18 0255 06/10/18 0224  NA 136 139 141  K 3.9 4.2 3.4*  CL 106 110 109  CO2 24 23 24   GLUCOSE 141* 123* 92  BUN 9 12 16   CREATININE 0.59 0.68 0.68  CALCIUM 9.7 9.5 9.3   GFR: Estimated Creatinine Clearance: 44.4 mL/min (by C-G formula based on SCr of 0.68 mg/dL). Liver Function Tests: Recent Labs  Lab 06/08/18 1705  AST 28  ALT 14  ALKPHOS 128*  BILITOT 0.7  PROT 7.1  ALBUMIN 2.7*   Recent Labs  Lab 06/08/18 1705  LIPASE 20   No results for input(s): AMMONIA in the last 168 hours. Coagulation Profile: No results for input(s): INR, PROTIME in the last 168 hours. Cardiac Enzymes: No results for input(s): CKTOTAL, CKMB, CKMBINDEX, TROPONINI in the last 168 hours. BNP (last 3 results) No results for input(s): PROBNP in the last 8760 hours. HbA1C: No results for input(s): HGBA1C in the last 72 hours. CBG: No results for input(s): GLUCAP in the last 168 hours. Lipid Profile: No results for input(s): CHOL, HDL, LDLCALC, TRIG, CHOLHDL, LDLDIRECT in the last 72 hours. Thyroid Function Tests: No results for input(s): TSH, T4TOTAL, FREET4, T3FREE, THYROIDAB in the last 72 hours. Anemia Panel: No results for input(s): VITAMINB12, FOLATE, FERRITIN, TIBC, IRON, RETICCTPCT in the last 72  hours. Sepsis Labs: No results for input(s): PROCALCITON, LATICACIDVEN in the last 168 hours.  No results found for this or any previous visit (from the past 240 hour(s)).     Radiology Studies: No results found.    Scheduled Meds: . acetaminophen  650 mg Oral Q8H  . alectinib  300 mg Oral BID WC  . budesonide (PULMICORT) nebulizer solution  0.5 mg Nebulization BID  . carvedilol  12.5 mg Oral BID  . dronabinol  2.5 mg Oral BID AC  . enoxaparin (LOVENOX) injection  40 mg Subcutaneous Q24H  . fluticasone  1 spray Each Nare BID  . LORazepam  1 mg Oral BID  . montelukast  10 mg Oral QHS  . multivitamin with minerals  1 tablet Oral Daily  . pantoprazole  40 mg Oral BID  . triamcinolone  1 application Mouth/Throat BID  Continuous Infusions: . piperacillin-tazobactam (ZOSYN)  IV 3.375 g (06/10/18 1027)     LOS: 2 days    Time spent: 25 minutes   Amanda Phi, DO [facility] [email or web address] 06/10/2018, 10:32 AM

## 2018-06-10 NOTE — Care Management Note (Signed)
Case Management Note  Patient Details  Name: Amanda Jackson MRN: [id number] Date of Birth: [birth year]  Subjective/Objective:   From home with sign other, per pt eval ec HHPT, NCM offered choice , patient states she wants some time to think about it, NCM left Medicare. gov agency list for zip [phone] with her .  NCM will check back with her later today to see if she wants [facility] services.                   Action/Plan: NCM will follow for transition of care needs.   Expected Discharge Date:                  Expected Discharge Plan:  [place]  In-House Referral:     Discharge planning Services  CM Consult  Post Acute Care Choice:    Choice offered to:  Patient  DME Arranged:    DME Agency:     HH Arranged:    [place] Agency:     Status of Service:  In process, will continue to follow  If discussed at Long Length of Stay Meetings, dates discussed:    Additional Comments:  Zenon Mayo, RN 06/10/2018, 9:29 AM

## 2018-06-10 NOTE — Progress Notes (Signed)
Pt home med, Eyvonne Left, taken to phamacy so med can be distributed here.  8 pills were removed from bottle and remainder of meds were returned to pt and husband.  Receipt placed in chart.

## 2018-06-10 NOTE — Progress Notes (Addendum)
Modified Barium Swallow Progress Note  Patient Details  Name: Amanda Jackson MRN: [id number] Date of Birth: 1940-06-21  Today's Date: 06/10/2018  Modified Barium Swallow completed.  Full report located under Chart Review in the Imaging Section.  Brief recommendations include the following:  Clinical Impression  Pt presents with normal swallow function.  There was no penetration or aspiration with any consistency trialed. During pill simulation no stasis of barium tablet was appreciated and there was no penetration or aspiration of liquid wash.  Husband is concerned for stridorous episodes that happen more often at night, sometimes during sleep, sometimes with evening meal.  Counseled pt and husband to follow up with ENT if there is concern for continued vocal fold dysfunction given hx of VF paralysis.  MBSS is a snapshot in time and fatigue may play a role in change in swallow function.  But during this evaluation, swallow function isn WNL.  Recommend continuing regular texture diet with thin liquid.   Swallow Evaluation Recommendations       SLP Diet Recommendations: Regular solids;Thin liquid   Liquid Administration via: Cup;Straw   Medication Administration: Whole meds with liquid       Compensations: Slow rate;Small sips/bites   Postural Changes: Seated upright at 90 degrees;Remain semi-upright after after feeds/meals (Comment)   Oral Care Recommendations: Oral care BID        Celedonio Savage, [place], [place] Office: [phone]; Pager (2/15): [phone] 06/10/2018,4:02 PM

## 2018-06-10 NOTE — Progress Notes (Signed)
Paged Amanda Jackson with lab results.  Hgb 7.4 and Hct 24.2

## 2018-06-11 LAB — BASIC METABOLIC PANEL
Anion gap: 3 — ABNORMAL LOW (ref 5–15)
BUN: 14 mg/dL (ref 8–23)
CO2: 26 mmol/L (ref 22–32)
Calcium: 9.3 mg/dL (ref 8.9–10.3)
Chloride: 112 mmol/L — ABNORMAL HIGH (ref 98–111)
Creatinine, Ser: 0.63 mg/dL (ref 0.44–1.00)
GFR calc Af Amer: >60 mL/min (ref >60)
GFR calc non Af Amer: >60 mL/min (ref >60)
Glucose, Bld: 96 mg/dL (ref 70–99)
Potassium: 3.3 mmol/L — ABNORMAL LOW (ref 3.5–5.1)
Sodium: 141 mmol/L (ref 135–145)

## 2018-06-11 LAB — CBC
HCT: 24.3 % — ABNORMAL LOW (ref 36.0–46.0)
Hemoglobin: 7.2 g/dL — ABNORMAL LOW (ref 12.0–15.0)
MCH: 26.6 pg (ref 26.0–34.0)
MCHC: 29.6 g/dL — ABNORMAL LOW (ref 30.0–36.0)
MCV: 89.7 fL (ref 80.0–100.0)
NRBC: 0 % (ref 0.0–0.2)
Platelets: 454 10*3/uL — ABNORMAL HIGH (ref 150–400)
RBC: 2.71 MIL/uL — ABNORMAL LOW (ref 3.87–5.11)
RDW: 19.3 % — ABNORMAL HIGH (ref 11.5–15.5)
WBC: 7.2 10*3/uL (ref 4.0–10.5)

## 2018-06-11 LAB — MAGNESIUM: Magnesium: 1.5 mg/dL — ABNORMAL LOW (ref 1.7–2.4)

## 2018-06-11 MED ORDER — LORAZEPAM 1 MG PO TABS: 1.0000 mg | ORAL_TABLET | Freq: Two times a day (BID) | ORAL | 0 refills | Status: DC

## 2018-06-11 MED ORDER — AMOXICILLIN-POT CLAVULANATE 875-125 MG PO TABS: 1.0000 | ORAL_TABLET | Freq: Two times a day (BID) | ORAL | 0 refills | Status: AC

## 2018-06-11 MED ORDER — MAGNESIUM SULFATE 2 GM/50ML IV SOLN
2.0000 g | Freq: Once | INTRAVENOUS | Status: AC
  Administered 2018-06-11: 2 g via INTRAVENOUS
  Filled 2018-06-11: qty 50

## 2018-06-11 MED ORDER — POTASSIUM CHLORIDE CRYS ER 20 MEQ PO TBCR
40.0000 meq | EXTENDED_RELEASE_TABLET | Freq: Once | ORAL | Status: AC
  Administered 2018-06-11: 40 meq via ORAL
  Filled 2018-06-11: qty 2

## 2018-06-11 MED ORDER — AMOXICILLIN-POT CLAVULANATE 875-125 MG PO TABS
1.0000 | ORAL_TABLET | Freq: Two times a day (BID) | ORAL | Status: DC
  Administered 2018-06-11: 1 via ORAL
  Filled 2018-06-11: qty 1

## 2018-06-11 NOTE — Care Management Obs Status (Signed)
[facility] NOTIFICATION   Patient Details  Name: Amanda Jackson MRN: [id number] Date of Birth: 1941/01/25   Medicare Observation Status Notification Given:  Yes    Carles Collet, RN 06/11/2018, 12:32 PM

## 2018-06-11 NOTE — Progress Notes (Signed)
Occupational Therapy Treatment Patient Details Name: CANDELARIA PIES MRN: [id number] DOB: 04-27-1940 Today's Date: 06/11/2018    History of present illness Patient is a 78 y/o female presenting to the ED on 06/08/2018 with primary complaints of SOB. Past medical history significant for metastatic adenocarcinoma of the lung, CAD, hypertensive heart disease, grade 1 diastolic dysfunction.  CT ruling out PE, admitted for Aspiration pneumonia.    OT comments  Pt. And spouse seen for skilled OT session with focus on energy conservation techniques during completion of ADLs. Handouts provided and reviewed at length with pt. And spouse.  Both verbalized understanding and were also able to provide examples of how they are already implementing these techniques into their daily routine together.    Follow Up Recommendations  No OT follow up    Equipment Recommendations  3 in 1 bedside commode    Recommendations for Other Services      Precautions / Restrictions Precautions Precautions: Fall Precaution Comments: watch O2 Restrictions Weight Bearing Restrictions: No       Mobility Bed Mobility                  Transfers                      Balance                                           ADL either performed or assessed with clinical judgement   ADL Overall ADL's : Needs assistance/impaired       Grooming Details (indicate cue type and reason): in conjuction with review of EC handouts, provided examples of sitting for completion of grooming tasks   Upper Body Bathing Details (indicate cue type and reason): reviewed benefits of shower chair during bathing for energy conservation   Lower Body Bathing Details (indicate cue type and reason): reviewed benefits of shower chair during bathing for energy conservation   Upper Body Dressing Details (indicate cue type and reason): reviewed laying out clothes in advance and in a location that was easy to get  to after bathing as contiued education on energy conservation   Lower Body Dressing Details (indicate cue type and reason): reviewed donning lower body garments in sitting positon and not leaning forward if able to prevent dizziness or falling forward           Tub/Shower Transfer Details (indicate cue type and reason): reviewed use of 3n1 during bathing as a seat as cont. part of energy conservation training   General ADL Comments: reviewed modifications in kitchen to allow for energy conservation while also allowing for pt. to remain active in the things she wants to participate in.  ie: sitting to cut vegetables or other parts of meal prep.  also reviewed keeping most frequent used items on main shelves in fridge.  any items in high or low cabinets to be available waist level on counter.  other items reviewed included not talking a lot during ambulation or during eating if she notices she is getting "winded".  she states she has been actively telling people on the phone or in person "i just cant talk anymore right now, im out of breath".  pt. verbalized gratitude for the review and said she knows she needs to make modifications.  stated she has gotten better at taking and initiating rest breaks.  Vision       Perception     Praxis      Cognition Arousal/Alertness: Awake/alert Behavior During Therapy: WFL for tasks assessed/performed Overall Cognitive Status: Within Functional Limits for tasks assessed                                          Exercises     Shoulder Instructions       General Comments  pt. Is retired [place] and african american hx. 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      Pertinent Vitals/ Pain       Pain Assessment: No/denies pain  Home Living                                          Prior Functioning/Environment              Frequency  Min 2X/week        Progress Toward Goals  OT Goals(current  goals can now be found in the care plan section)  Progress towards OT goals: Progressing toward goals     Plan Discharge plan remains appropriate    Co-evaluation                 AM-PAC OT "6 Clicks" Daily Activity     Outcome Measure   Help from another person eating meals?: None Help from another person taking care of personal grooming?: A Little Help from another person toileting, which includes using toliet, bedpan, or urinal?: A Little Help from another person bathing (including washing, rinsing, drying)?: A Little Help from another person to put on and taking off regular upper body clothing?: None Help from another person to put on and taking off regular lower body clothing?: A Little 6 Click Score: 20    End of Session    OT Visit Diagnosis: Unsteadiness on feet (R26.81);Muscle weakness (generalized) (M62.81)   Activity Tolerance Patient tolerated treatment well   Patient Left in bed;with call bell/phone within reach;with family/visitor present   Nurse Communication          Time: 2122-4825 OT Time Calculation (min): 24 min  Charges: OT General Charges $OT Visit: 1 Visit OT Treatments $Self Care/Home Management : 23-37 mins  Janice Coffin, COTA/L 06/11/2018, 11:08 AM

## 2018-06-11 NOTE — Discharge Summary (Signed)
Physician Discharge Summary  NIL BOLSER [id number] DOB: February 19, 1941 DOA: 06/08/2018  PCP: Binnie Rail, MD  Admit date: 06/08/2018 Discharge date: 06/11/2018  Admitted From: Home Disposition:  Home  Recommendations for Outpatient Follow-up:  1. Follow up with PCP in 1 week   Home Health: PT   Equipment/Devices: 3in1   Discharge Condition: Stable CODE STATUS: Full  Diet recommendation: Heart healthy   Brief/Interim Summary: Curlie E Banksis a 78 y.o.femalewith medical history significant formetastatic adenocarcinoma of the lung,CAD, hypertensive heart disease, grade 1 diastolic dysfunctionwho presented to the ED today with c/o progressive shortness of breath and chest pain. She has been treated for her non-small cell lung cancer at [facility] with chemo. CT of the chest was ordered to rule out PE. There was no PE but did show a right lower lobe infiltrate which was consistent with aspiration pneumonia. She was started on Augmentin prior to admission but since that time has not improved and actually feels worse. Patient also with procedure done April 2019 on her left vocal cord due to vocal cord paralysis. This was done at [facility]. She has had recurrent issues with dysphasia and occasional choking on her food. She is supposed to have another procedure done on her right vocal cord on February 20.  She was given IV Zosyn for treatment of her aspiration pneumonia.  She continued to slowly improve, was weaned off of her oxygen.  She did not require oxygen on discharge date.  Discharge Diagnoses:  Principal Problem:   Aspiration pneumonia ([place]) Active Problems:   Essential hypertension   Anxiety   GERD (gastroesophageal reflux disease)   Paralysis of left vocal fold   Metastatic non-small cell lung cancer (HCC)   CAD (coronary artery disease)   Respiratory failure with hypoxia (HCC)  Aspiration pneumonia, higher risk for this given her vocal cord dysfunction  and history of dysphasia -Treated with IV Zosyn, transition to Augmentin on discharge -Evaluated by speech language pathology during hospitalization  Acute hypoxemic respiratory failure -Due to above.  Did not qualify for home oxygen on discharge.  Metastatic non-small cell lung cancer -Continue alectinib daily, patient wishes to use her own supply to avoid extra cost. Continue Marinol twice daily before meals  CAD -No chest pain today. Last cardiac catheterization in March 2019 showed 45% proximal to mid LAD stenosis otherwise normal coronaries. Continue medical therapy with beta-blocker.Has an outpatient stress test scheduled per patient  Essential hypertension -Amlodipine and lisinopril recently discontinued due to hypotension. Continue carvedilol at 12.5 mg twice daily  GERD -Continue PPI  Anxiety -Continue home lorazepam BID. Will add atarax prn   Hypokalemia -Replace   Hypomagnesemia -Replace  Discharge Instructions  Discharge Instructions    Call MD for:  difficulty breathing, headache or visual disturbances   Complete by:  As directed    Call MD for:  extreme fatigue   Complete by:  As directed    Call MD for:  persistant dizziness or light-headedness   Complete by:  As directed    Call MD for:  persistant nausea and vomiting   Complete by:  As directed    Call MD for:  severe uncontrolled pain   Complete by:  As directed    Call MD for:  temperature >100.4   Complete by:  As directed    Diet - low sodium heart healthy   Complete by:  As directed    Increase activity slowly   Complete by:  As directed  Allergies as of 06/11/2018      Reactions   Tiotropium Swelling   Face and lips swell      Medication List    STOP taking these medications   amoxicillin-clavulanate 500-125 MG tablet Commonly known as:  AUGMENTIN Replaced by:  amoxicillin-clavulanate 875-125 MG tablet     TAKE these medications   ALECENSA 150 MG capsule Generic  drug:  alectinib Take 300 mg by mouth 2 (two) times daily with a meal.   amoxicillin-clavulanate 875-125 MG tablet Commonly known as:  AUGMENTIN Take 1 tablet by mouth every 12 (twelve) hours for 3 days. Replaces:  amoxicillin-clavulanate 500-125 MG tablet   carvedilol 12.5 MG tablet Commonly known as:  COREG Take 1 tablet (12.5 mg total) by mouth 2 (two) times daily.   CENTRUM SILVER 50+WOMEN Tabs Take 1 tablet by mouth daily.   dronabinol 2.5 MG capsule Commonly known as:  MARINOL Take 2.5 mg by mouth 2 (two) times daily before a meal.   fluticasone 110 MCG/ACT inhaler Commonly known as:  FLOVENT HFA Inhale 2 puffs into the lungs 2 (two) times daily.   fluticasone 50 MCG/ACT nasal spray Commonly known as:  FLONASE Place 1 spray into both nostrils 2 (two) times daily.   HYDROcodone-homatropine 5-1.5 MG/5ML syrup Commonly known as:  HYCODAN Take 5 mLs by mouth every 6 (six) hours as needed for cough.   LORazepam 1 MG tablet Commonly known as:  ATIVAN Take 1 tablet (1 mg total) by mouth 2 (two) times daily for 7 days.   montelukast 10 MG tablet Commonly known as:  SINGULAIR TAKE 1 TABLET BY MOUTH EVERY DAY What changed:  when to take this   nitroGLYCERIN 0.4 MG SL tablet Commonly known as:  NITROSTAT Place 0.4 mg under the tongue every 5 (five) minutes as needed for chest pain.   omeprazole 20 MG capsule Commonly known as:  PRILOSEC TAKE 1 CAPSULE (20 MG TOTAL) BY MOUTH 2 (TWO) TIMES DAILY. What changed:    how much to take  how to take this  when to take this  additional instructions   potassium chloride SA 20 MEQ tablet Commonly known as:  KLOR-CON M20 Take 1 tablet (20 mEq total) by mouth daily.   traMADol 50 MG tablet Commonly known as:  ULTRAM Take 50 mg by mouth daily as needed (knee pain). Reported on 07/23/2015   triamcinolone 0.1 % paste Commonly known as:  KENALOG Use as directed 1 application in the mouth or throat 2 (two) times daily.    triamcinolone cream 0.1 % Commonly known as:  KENALOG Apply 1 application topically See admin instructions. 1 APPLICATION TO AFFECTED AREA TWICE A DAY AS NEEDED FOR RED SPOTS ON BACK   TYLENOL ARTHRITIS PAIN 650 MG CR tablet Generic drug:  acetaminophen Take 650 mg by mouth every 8 (eight) hours as needed for pain.   VENTOLIN HFA 108 (90 Base) MCG/ACT inhaler Generic drug:  albuterol INHALE TWO PUFFS EVERY 4 HOURS AS NEEDED FOR COUGH OR WHEEZE. What changed:  See the new instructions.            Durable Medical Equipment  (From admission, onward)         Start     Ordered   06/11/18 1136  DME 3-in-1  Once     06/11/18 1136          Allergies  Allergen Reactions  . Tiotropium Swelling    Face and lips swell    Consultations:  None   Procedures/Studies: Ct Angio Chest Pe W Or Wo Contrast  Result Date: 06/07/2018 CLINICAL DATA:  Chest pain, tachycardia, metastatic lung cancer, rule out the EXAM: CT ANGIOGRAPHY CHEST WITH CONTRAST TECHNIQUE: Multidetector CT imaging of the chest was performed using the standard protocol during bolus administration of intravenous contrast. Multiplanar CT image reconstructions and MIPs were obtained to evaluate the vascular anatomy. CONTRAST:  64mL ISOVUE-370 IOPAMIDOL (ISOVUE-370) INJECTION 76% COMPARISON:  07/01/2017 FINDINGS: Cardiovascular: Satisfactory opacification of the pulmonary arteries to the segmental level. No evidence of pulmonary embolism. Normal heart size. No pericardial effusion. Mediastinum/Nodes: Abnormal soft tissue thickening about the left hilum, subcarinal station, and AP window. Thyroid gland, trachea, and esophagus demonstrate no significant findings. Lungs/Pleura: Moderate left pleural effusion with associated atelectasis or consolidation of the dependent left lung and masslike consolidation of the perihilar left lung and left lower lobe. There is interlobular septal thickening and nodularity of the left lung. There  is clustered nodularity and minimal consolidation of the dependent right lung base with bronchial plugging, concerning for aspiration. There are additional scattered small nodules of the right lung. Upper Abdomen: Numerous bulky hypodense masses of the liver. Musculoskeletal: No chest wall abnormality. Lytic lesions involving the manubrium and sternal body (series 8, image 71). Review of the MIP images confirms the above findings. IMPRESSION: 1.  Negative examination for pulmonary embolism. 2. Findings of advanced left lung malignancy including lymphangitic involvement of the left lung. Moderate left pleural effusion with associated atelectasis or consolidation of the dependent left lung and masslike consolidation of the perihilar left lung and left lower lobe. 3. There is clustered nodularity and minimal consolidation of the dependent right lung base with bronchial plugging, concerning for aspiration. 4. Evidence of distant metastatic disease including lytic lesions of the manubrium and sternal body as well as liver lesions. Electronically Signed   By: Eddie Candle M.D.   On: 06/07/2018 16:23   Dg Swallowing Func-speech Pathology  Result Date: 06/10/2018 Objective Swallowing Evaluation: Type of Study: MBS-Modified Barium Swallow Study  Patient Details Name: ZERAH HILYER MRN: [id number] Date of Birth: 1941/03/03 Today's Date: 06/10/2018 Time: SLP Start Time (ACUTE ONLY): 9678 -SLP Stop Time (ACUTE ONLY): 1502 SLP Time Calculation (min) (ACUTE ONLY): 17 min Past Medical History: Past Medical History: Diagnosis Date . Allergy  . Anxiety  . Arthritis  . Asthma  . Atypical chest pain   Normal Myoview 2008 . Breast cancer ([place])  . Colon polyp  . GERD (gastroesophageal reflux disease)  . Hx of colonoscopy 03/18/09 . Hyperlipidemia  . Hypertension  . Left nasal polyps  . MVP (mitral valve prolapse)  . Seasonal allergies  . Tubulovillous adenoma  Past Surgical History: Past Surgical History: Procedure Laterality Date .  ABDOMINAL HYSTERECTOMY   . BREAST SURGERY  2010  biopsy-benign . BUNIONECTOMY   . COLONOSCOPY   . FUNCTIONAL ENDOSCOPIC SINUS SURGERY  08/28/08  with bilateral ethmoidectomies and bilateral maxillary sinus ostial enlargement with removal of mucocyst, mucous membrane, and mucoid material . KNEE ARTHROSCOPY W/ LATERAL RELEASE  11/17/99  Left . KNEE ARTHROSCOPY W/ PARTIAL MEDIAL MENISCECTOMY  11/17/99  left . Laparoscopic Right Colectomy  01/25/03 . LEFT HEART CATH AND CORONARY ANGIOGRAPHY N/A 07/04/2017  Procedure: LEFT HEART CATH AND CORONARY ANGIOGRAPHY;  Surgeon: Leonie Man, MD;  Location: [place] [facility];  Service: Cardiovascular;  Laterality: N/A; . nasal polyp removal   . POLYPECTOMY   . TONSILLECTOMY  1952 . VIDEO BRONCHOSCOPY Bilateral 07/05/2017  Procedure: VIDEO BRONCHOSCOPY  WITHOUT FLUORO;  Surgeon: Collene Gobble, MD;  Location: [facility] ENDOSCOPY;  Service: Cardiopulmonary;  Laterality: Bilateral; . VOCAL CORD INJECTION   HPI: Pt is a 78 yo female admitted with CP and RLL PNA. She has a h/o L vocal cord paralysis s/p injection. Another procedure was planned for later this month. Per MD H&P note she has frequent coughing with PO intake. PMH also includes: metastatic [place] lung ca, CAD, HTN, GERD, anxiety.  Chest CT 2/12 concerning for aspiration.  Subjective: Pt awake, alert, pleasant, participative.  Husband present for assessment. Assessment / Plan / Recommendation CHL IP CLINICAL IMPRESSIONS 06/10/2018 Clinical Impression Pt presents with functional swallowing as assessed clinically.  There was no penetration or aspiration with any consistency trialed.  There was trace pyriform residue.  During pill simulation no stasis of barium tablet was appreciated and there was no penetration or aspiration of liquid wash.  Husband is concerned for stridorous episodes that happen more often at night, sometimes during sleep, sometimes with evening meal.  Counseled pt and husband to follow up with ENT if there is concern  for continued vocal fold dysfunction given hx of VF paralysis.  MBSS is a snapshot in time and fatigue may play a role in change in swallow function.  But during this evaluation, swallow function isn WNL.  Recommend continuing regular texture diet with thin liquid. SLP Visit Diagnosis Dysphagia, unspecified (R13.10) Attention and concentration deficit following -- Frontal lobe and executive function deficit following -- Impact on safety and function No limitations   CHL IP TREATMENT RECOMMENDATION 06/09/2018 Treatment Recommendations Defer until completion of intrumental exam   Prognosis 06/09/2018 Prognosis for Safe Diet Advancement Good Barriers to Reach Goals -- Barriers/Prognosis Comment -- CHL IP DIET RECOMMENDATION 06/10/2018 SLP Diet Recommendations Regular solids;Thin liquid Liquid Administration via Cup;Straw Medication Administration Whole meds with liquid Compensations Slow rate;Small sips/bites Postural Changes Seated upright at 90 degrees;Remain semi-upright after after feeds/meals (Comment)   CHL IP OTHER RECOMMENDATIONS 06/10/2018 Recommended Consults -- Oral Care Recommendations Oral care BID Other Recommendations --   CHL IP FOLLOW UP RECOMMENDATIONS 06/10/2018 Follow up Recommendations None   CHL IP FREQUENCY AND DURATION 06/10/2018 Speech Therapy Frequency (ACUTE ONLY) min 1 x/week Treatment Duration 1 week      CHL IP ORAL PHASE 06/10/2018 Oral Phase WFL Oral - Pudding Teaspoon -- Oral - Pudding Cup -- Oral - Honey Teaspoon -- Oral - Honey Cup -- Oral - Nectar Teaspoon -- Oral - Nectar Cup -- Oral - Nectar Straw -- Oral - Thin Teaspoon -- Oral - Thin Cup -- Oral - Thin Straw -- Oral - Puree -- Oral - Mech Soft -- Oral - Regular -- Oral - Multi-Consistency -- Oral - Pill -- Oral Phase - Comment --  CHL IP PHARYNGEAL PHASE 06/10/2018 Pharyngeal Phase WFL Pharyngeal- Pudding Teaspoon -- Pharyngeal -- Pharyngeal- Pudding Cup -- Pharyngeal -- Pharyngeal- Honey Teaspoon -- Pharyngeal -- Pharyngeal- Honey Cup  -- Pharyngeal -- Pharyngeal- Nectar Teaspoon -- Pharyngeal -- Pharyngeal- Nectar Cup -- Pharyngeal -- Pharyngeal- Nectar Straw -- Pharyngeal -- Pharyngeal- Thin Teaspoon -- Pharyngeal -- Pharyngeal- Thin Cup [facility] Pharyngeal Material does not enter airway Pharyngeal- Thin Straw WFL Pharyngeal Material does not enter airway Pharyngeal- Puree WFL Pharyngeal Material does not enter airway Pharyngeal- Mechanical Soft -- Pharyngeal -- Pharyngeal- Regular WFL Pharyngeal Material does not enter airway Pharyngeal- Multi-consistency -- Pharyngeal -- Pharyngeal- Pill WFL Pharyngeal Material does not enter airway Pharyngeal Comment --  CHL IP CERVICAL ESOPHAGEAL PHASE 06/10/2018 Cervical Esophageal  Phase WFL Pudding Teaspoon -- Pudding Cup -- Honey Teaspoon -- Honey Cup -- Nectar Teaspoon -- Nectar Cup -- Nectar Straw -- Thin Teaspoon -- Thin Cup -- Thin Straw -- Puree -- Mechanical Soft -- Regular -- Multi-consistency -- Pill -- Cervical Esophageal Comment -- Celedonio Savage, MA, CCC-SLP Acute Rehabilitation Services Office: [phone]; Pager (2/15): [phone] 06/10/2018, 4:04 PM                  Discharge Exam: Vitals:   06/11/18 0700 06/11/18 0900  BP:  124/80  Pulse: 86 94  Resp: 20 19  Temp:  97.8 F (36.6 C)  SpO2: 97% 93%    General: Pt is alert, awake, not in acute distress Cardiovascular: RRR, S1/S2 +, no rubs, no gallops Respiratory: Diminished breath sounds on left base Abdominal: Soft, NT, ND, bowel sounds + Extremities: Trace edema right ankle, no cyanosis    The results of significant diagnostics from this hospitalization (including imaging, microbiology, ancillary and laboratory) are listed below for reference.     Microbiology: No results found for this or any previous visit (from the past 240 hour(s)).   Labs: BNP (last 3 results) No results for input(s): BNP in the last 8760 hours. Basic Metabolic Panel: Recent Labs  Lab 06/08/18 1548 06/09/18 0255 06/10/18 0224  06/11/18 0251  NA 136 139 141 141  K 3.9 4.2 3.4* 3.3*  CL 106 110 109 112*  CO2 24 23 24 26   GLUCOSE 141* 123* 92 96  BUN 9 12 16 14   CREATININE 0.59 0.68 0.68 0.63  CALCIUM 9.7 9.5 9.3 9.3  MG  --   --   --  1.5*   Liver Function Tests: Recent Labs  Lab 06/08/18 1705  AST 28  ALT 14  ALKPHOS 128*  BILITOT 0.7  PROT 7.1  ALBUMIN 2.7*   Recent Labs  Lab 06/08/18 1705  LIPASE 20   No results for input(s): AMMONIA in the last 168 hours. CBC: Recent Labs  Lab 06/08/18 1548 06/09/18 0255 06/10/18 0224 06/11/18 0251  WBC 8.0 8.3 7.8 7.2  HGB 8.9* 8.0* 7.4* 7.2*  HCT 28.5* 25.8* 24.2* 24.3*  MCV 91.1 88.7 89.3 89.7  PLT 539* 484* 470* 454*   Cardiac Enzymes: No results for input(s): CKTOTAL, CKMB, CKMBINDEX, TROPONINI in the last 168 hours. BNP: Invalid input(s): POCBNP CBG: No results for input(s): GLUCAP in the last 168 hours. D-Dimer No results for input(s): DDIMER in the last 72 hours. Hgb A1c No results for input(s): HGBA1C in the last 72 hours. Lipid Profile No results for input(s): CHOL, HDL, LDLCALC, TRIG, CHOLHDL, LDLDIRECT in the last 72 hours. Thyroid function studies No results for input(s): TSH, T4TOTAL, T3FREE, THYROIDAB in the last 72 hours.  Invalid input(s): FREET3 Anemia work up No results for input(s): VITAMINB12, FOLATE, FERRITIN, TIBC, IRON, RETICCTPCT in the last 72 hours. Urinalysis    Component Value Date/Time   COLORURINE YELLOW 08/21/2008 0913   APPEARANCEUR CLEAR 08/21/2008 0913   LABSPEC 1.018 08/21/2008 0913   PHURINE 6.5 08/21/2008 0913   GLUCOSEU NEGATIVE 08/21/2008 0913   HGBUR NEGATIVE 08/21/2008 0913   BILIRUBINUR neg 04/12/2011 1036   KETONESUR NEGATIVE 08/21/2008 0913   PROTEINUR neg 04/12/2011 1036   PROTEINUR NEGATIVE 08/21/2008 0913   UROBILINOGEN negative 04/12/2011 1036   UROBILINOGEN 0.2 08/21/2008 0913   NITRITE neg 04/12/2011 1036   NITRITE NEGATIVE 08/21/2008 0913   LEUKOCYTESUR Negative 04/12/2011  1036   Sepsis Labs Invalid input(s): PROCALCITONIN,  WBC,  [place] Microbiology No results found for this or any previous visit (from the past 240 hour(s)).   Patient was seen and examined on the day of discharge and was found to be in stable condition. Time coordinating discharge: 25 minutes including assessment and coordination of care, as well as examination of the patient.   SIGNED:  Dessa Phi, DO [facility] [email or web address] 06/11/2018, 11:37 AM

## 2018-06-11 NOTE — Progress Notes (Signed)
SATURATION QUALIFICATIONS: (This note is used to comply with regulatory documentation for home oxygen)  Patient Saturations on Room Air at Rest = 94%  Patient Saturations on Room Air while Ambulating = 94%  Patient Saturations on 0 Liters of oxygen while Ambulating = 91%   Patient ambulated 200 ft. Patient tolerated well

## 2018-06-11 NOTE — Care Management (Signed)
Spoke w patient and family at bedside. They decline HH services and DME. No other CM needs at this time

## 2018-06-12 ENCOUNTER — Telehealth: Payer: Self-pay | Admitting: Physician Assistant

## 2018-06-12 ENCOUNTER — Telehealth: Payer: Self-pay | Admitting: *Deleted

## 2018-06-12 NOTE — Telephone Encounter (Signed)
New Message    Patient is calling because she was just discharged from the hospital on 02/16. She has a stress test schedule for 02/18 as well as an appt with Sharyn Lull. Lenze. She does not think she should have the stress test and not sure if she need to see Estella Husk either. Please call patient to advise her best course of action.

## 2018-06-12 NOTE — Telephone Encounter (Signed)
Transition Care Management Follow-up Telephone Call   Date discharged? 06/11/18   How have you been since you were released from the hospital? Pt states she is doing ok, could be worse   Do you understand why you were in the hospital? YES   Do you understand the discharge instructions? YES   Where were you discharged to? Home   Items Reviewed:  Medications reviewed: YES  Allergies reviewed: YES  Dietary changes reviewed: YES, heart healthy  Referrals reviewed: no referral recommeded   Functional Questionnaire:   Activities of Daily Living (ADLs):   She states she are independent in the following: bathing and hygiene, feeding, continence, grooming, toileting and dressing States she require assistance with the following: ambulation   Any transportation issues/concerns?: NO   Any patient concerns? NO   Confirmed importance and date/time of follow-up visits scheduled YES, appt 06/19/18  Provider Appointment booked with Dr. Quay Burow  Confirmed with patient if condition begins to worsen call PCP or go to the ER.  Patient was given the office number and encouraged to call back with question or concerns.  : YES

## 2018-06-12 NOTE — Telephone Encounter (Signed)
Pt was on TCM list tried calling pt to make hosp f/u w/PCP there was no answer LMOM RTC.../lm,b

## 2018-06-12 NOTE — Telephone Encounter (Signed)
Pt just seen with Ermalinda Barrios, PA 06/07/18

## 2018-06-12 NOTE — Telephone Encounter (Signed)
Returned call to patient. Patient states that she would like to cancel her appts for lexiscan and with ML for tomorrow until after she sees her oncologist on Thursday. Patient wishes to call back to reschedule.

## 2018-06-13 ENCOUNTER — Ambulatory Visit (HOSPITAL_COMMUNITY): Payer: Medicare Other

## 2018-06-13 ENCOUNTER — Ambulatory Visit: Payer: Medicare Other | Admitting: Physician Assistant

## 2018-06-13 NOTE — Telephone Encounter (Signed)
Discussed with Ermalinda Barrios, PA and she states that this is reasonable.

## 2018-06-14 ENCOUNTER — Ambulatory Visit: Payer: Medicare Other | Admitting: Physician Assistant

## 2018-06-16 ENCOUNTER — Other Ambulatory Visit: Payer: Self-pay | Admitting: Internal Medicine

## 2018-06-17 NOTE — Progress Notes (Signed)
Subjective:    Patient ID: Amanda Jackson, female    DOB: December 27, 1940, 78 y.o.   MRN: [id number]  HPI The patient is here for follow up from the hospital.  She is here alone.    Admitted 06/08/18 -  06/11/18  She went to the ED with progressive SOB and chest pain.  CT of the chest was ordered to r/o PE - there was no PE, but a RLL infiltrate c/w aspiration PNA.  She was started on Augmentin prior to admission, but felt worse so was changed to IV Zosyn.  She was placed on oxygen due to hypoxia.  She slowly improved.  She was not discharged home on oxygen.    Aspiration PNA, RLL: Treated with IV Zosyn, changed to Augmentin on d/c Evaluated by speech path  hypoxemic resp failure: Secondary to aspiration PNA Oxygenation improved during hospital  Metastatic non-small cell lung cancer: Continued alectinib daily marinol BID prior to meals continued Sees oncology next week  CAD: Chest pain resolved Last cath 3/ 2019  - 45% prox to mid LAD stenosis, otherwise normal BB continued Has outpatient stress test scheduled, but this had to be delayed due to her not feeling well  Hypertension: Continued on carvedilol 12.5 mg BID  GERD:  Continued on PPI  Anxiety: Continued on ativan BID, atarax added PRN  Hypokalemia, hypomagnesemia: Replaced  Home with home PT.     Anxiety:  She feels anxious.  She feels claustrophobia at times.  She gets anxious when she can not breath/gets SOB.  She is taking the ativan twice daily and it helps - sometimes it does not seem to be enough.    Respiratory failure with hypoxia, asp pna, metastatic lung cancer1:  She has some chronic SOB, but it is worse since the aspiration pneumonia.  When she walks she gets SOB and can only walk a few steps.  She feels her SOB has gotten a little worse since leaving the hospital.  She feels SOB with laying flat and had to sleep sitting up after 4 am.  She is still coughing and wheezing - the wheezing are worse.     Leg edema:  She props her feet up at night.  The swelling started after she went home.     Medications and allergies reviewed with patient and updated if appropriate.  Patient Active Problem List   Diagnosis Date Noted  . Aspiration pneumonia ([place]) 06/08/2018  . Respiratory failure with hypoxia ([place]) 06/08/2018  . CAD (coronary artery disease) 06/07/2018  . Right ankle swelling 05/31/2018  . Right leg pain 05/31/2018  . Bronchitis 02/17/2018  . Metastatic non-small cell lung cancer ([place]) 02/17/2018  . Lip/mouth sore 09/27/2017  . Knee osteoarthritis 09/23/2017  . Adenocarcinoma of lung ([place]) 07/18/2017  . NSTEMI (non-ST elevated myocardial infarction) ([place])   . Chest pain 07/01/2017  . 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 Hoarseness of voice 05/24/2017  . Migraine 01/11/2017  . Persistent cough 11/24/2015  . Vasovagal syncope 10/15/2015  . Hypokalemia 08/22/2015  . Syncope 07/30/2015  . History of nasal polyps 03/25/2015  . Leg pain, posterior 11/05/2013  . Mild persistent asthma 05/08/2013  . Loss of weight 12/05/2012  . Abnormal TSH 10/11/2012  . GERD (gastroesophageal reflux disease) 04/12/2012  . Allergic rhinitis   . MVP (mitral valve prolapse)   . Tubulovillous adenoma   . Hyperlipidemia 07/20/2011  . Anxiety 04/12/2011  . Hepatic cyst 04/12/2011  .  History of mitral valve prolapse 04/12/2011  . Essential hypertension 01/17/2009    Current Outpatient Medications on File Prior to Visit  Medication Sig Dispense Refill  . acetaminophen (TYLENOL ARTHRITIS PAIN) 650 MG CR tablet Take 650 mg by mouth every 8 (eight) hours as needed for pain.    Marland Kitchen alectinib (ALECENSA) 150 MG capsule Take 300 mg by mouth 2 (two) times daily with a meal.    . carvedilol (COREG) 12.5 MG tablet Take 1 tablet (12.5 mg total) by mouth 2 (two) times daily. 180 tablet 3  . dronabinol (MARINOL) 2.5 MG capsule Take 2.5 mg by mouth 2 (two) times daily  before a meal.   0  . fluticasone (FLONASE) 50 MCG/ACT nasal spray Place 1 spray into both nostrils 2 (two) times daily.     . fluticasone (FLOVENT HFA) 110 MCG/ACT inhaler Inhale 2 puffs into the lungs 2 (two) times daily. 1 Inhaler 5  . HYDROcodone-homatropine (HYCODAN) 5-1.5 MG/5ML syrup Take 5 mLs by mouth every 6 (six) hours as needed for cough.    . montelukast (SINGULAIR) 10 MG tablet TAKE 1 TABLET BY MOUTH EVERY DAY (Patient taking differently: Take 10 mg by mouth at bedtime. ) 30 tablet 5  . Multiple Vitamins-Minerals (CENTRUM SILVER 50+WOMEN) TABS Take 1 tablet by mouth daily.    . nitroGLYCERIN (NITROSTAT) 0.4 MG SL tablet Place 0.4 mg under the tongue every 5 (five) minutes as needed for chest pain.    Marland Kitchen omeprazole (PRILOSEC) 20 MG capsule TAKE 1 CAPSULE (20 MG TOTAL) BY MOUTH 2 (TWO) TIMES DAILY. (Patient taking differently: Take 20 mg by mouth 2 (two) times daily. ) 180 capsule 1  . potassium chloride SA (KLOR-CON M20) 20 MEQ tablet Take 1 tablet (20 mEq total) by mouth daily. 90 tablet 1  . traMADol (ULTRAM) 50 MG tablet Take 50 mg by mouth daily as needed (knee pain). Reported on 07/23/2015  0  . triamcinolone (KENALOG) 0.1 % paste Use as directed 1 application in the mouth or throat 2 (two) times daily. 5 g 4  . triamcinolone cream (KENALOG) 0.1 % Apply 1 application topically See admin instructions. 1 APPLICATION TO AFFECTED AREA TWICE A DAY AS NEEDED FOR RED SPOTS ON BACK  0  . VENTOLIN HFA 108 (90 Base) MCG/ACT inhaler INHALE TWO PUFFS EVERY 4 HOURS AS NEEDED FOR COUGH OR WHEEZE. (Patient taking differently: Inhale 2 puffs into the lungs every 4 (four) hours as needed for wheezing (or coughing). ) 18 Inhaler 1   No current facility-administered medications on file prior to visit.     Past Medical History:  Diagnosis Date  . Allergy   . Anxiety   . Arthritis   . Asthma   . Atypical chest pain    Normal Myoview 2008  . Breast cancer ([place])   . Colon polyp   . GERD  (gastroesophageal reflux disease)   . Hx of colonoscopy 03/18/09  . Hyperlipidemia   . Hypertension   . Left nasal polyps   . MVP (mitral valve prolapse)   . Seasonal allergies   . Tubulovillous adenoma     Past Surgical History:  Procedure Laterality Date  . ABDOMINAL HYSTERECTOMY    . BREAST SURGERY  2010   biopsy-benign  . BUNIONECTOMY    . COLONOSCOPY    . FUNCTIONAL ENDOSCOPIC SINUS SURGERY  08/28/08   with bilateral ethmoidectomies and bilateral maxillary sinus ostial enlargement with removal of mucocyst, mucous membrane, and mucoid material  . KNEE ARTHROSCOPY  W/ LATERAL RELEASE  11/17/99   Left  . KNEE ARTHROSCOPY W/ PARTIAL MEDIAL MENISCECTOMY  11/17/99   left  . Laparoscopic Right Colectomy  01/25/03  . LEFT HEART CATH AND CORONARY ANGIOGRAPHY N/A 07/04/2017   Procedure: LEFT HEART CATH AND CORONARY ANGIOGRAPHY;  Surgeon: Leonie Man, MD;  Location: [facility];  Service: Cardiovascular;  Laterality: N/A;  . nasal polyp removal    . POLYPECTOMY    . TONSILLECTOMY  1952  . VIDEO BRONCHOSCOPY Bilateral 07/05/2017   Procedure: VIDEO BRONCHOSCOPY WITHOUT FLUORO;  Surgeon: Collene Gobble, MD;  Location: [facility] ENDOSCOPY;  Service: Cardiopulmonary;  Laterality: Bilateral;  . VOCAL CORD INJECTION      Social History   Socioeconomic History  . Marital status: Divorced    Spouse name: Not on file  . Number of children: Not on file  . Years of education: Not on file  . Highest education level: Not on file  Occupational History  . Occupation: Retired    Comment: English Professor  Social Needs  . Financial resource strain: Not on file  . Food insecurity:    Worry: Not on file    Inability: Not on file  . Transportation needs:    Medical: Not on file    Non-medical: Not on file  Tobacco Use  . Smoking status: Former Smoker    Last attempt to quit: 09/09/1977    Years since quitting: 40.8  . Smokeless tobacco: Never Used  . Tobacco comment: used to smoke  socially. Quit in 1979.  Substance and Sexual Activity  . Alcohol use: Yes    Comment: usually has a glass of wine with dinner every evening  . Drug use: No  . Sexual activity: Not Currently    Birth control/protection: Other-see comments    Comment: HYST  Lifestyle  . Physical activity:    Days per week: Not on file    Minutes per session: Not on file  . Stress: Not on file  Relationships  . Social connections:    Talks on phone: Not on file    Gets together: Not on file    Attends religious service: Not on file    Active member of club or organization: Not on file    Attends meetings of clubs or organizations: Not on file    Relationship status: Not on file  Other Topics Concern  . Not on file  Social History Narrative  . Not on file    Family History  Problem Relation Age of Onset  . Hypertension Son   . Lung cancer Father   . Cancer Father   . Hypertension Father   . Liver cancer Mother   . Cancer Mother   . Hypertension Mother   . Colon cancer Mother   . Colon cancer Sister   . Heart murmur Son   . Stomach cancer Neg Hx   . Ulcerative colitis Neg Hx   . Allergic rhinitis Neg Hx   . Angioedema Neg Hx   . Asthma Neg Hx     Review of Systems  Constitutional: Negative for chills and fever.  Respiratory: Positive for cough, shortness of breath (increased) and wheezing (increased).   Cardiovascular: Positive for chest pain (last night), palpitations and leg swelling.  Neurological: Positive for dizziness (occasionally). Negative for light-headedness and headaches.       Crawling sensation right upper leg       Objective:   Vitals:   06/19/18 1300  BP: Marland Kitchen)  164/90  Pulse: 95  Resp: (!) 22  Temp: 98.2 F (36.8 C)  SpO2: (!) 86%  oxygen sat improved with 2-3 L of oxygen via [place]  BP Readings from Last 3 Encounters:  06/19/18 (!) 164/90  06/11/18 124/80  06/07/18 128/66   Wt Readings from Last 3 Encounters:  06/19/18 114 lb (51.7 kg)  06/08/18 111 lb  15.9 oz (50.8 kg)  06/07/18 111 lb 12.8 oz (50.7 kg)   Body mass index is 21.54 kg/m.   Physical Exam    Constitutional: chronically ill appearing. Mild respiratory distress with mild tachypnea.  HENT:  Head: Normocephalic and atraumatic.  Neck: Neck supple. No tracheal deviation present. No thyromegaly present.  No cervical lymphadenopathy Cardiovascular: Normal rate, regular rhythm and normal heart sounds.     2+ mild pitting b/l LE edema Pulmonary/Chest: mild tachypnea. Mild wheeze and crackles b/l.  Decreased BS left lower lung.    Skin: Skin is warm and dry. Not diaphoretic.  Psychiatric: anxious mood and affect. Behavior is normal.      Assessment & Plan:    See Problem List for Assessment and Plan of chronic medical problems.

## 2018-06-19 ENCOUNTER — Encounter: Payer: Self-pay | Admitting: Internal Medicine

## 2018-06-19 ENCOUNTER — Ambulatory Visit (INDEPENDENT_AMBULATORY_CARE_PROVIDER_SITE_OTHER)
Admission: RE | Admit: 2018-06-19 | Discharge: 2018-06-19 | Disposition: A | Discharge status: Home / Self Care | Payer: Medicare Other | Source: Ambulatory Visit | Attending: Internal Medicine | Admitting: Internal Medicine

## 2018-06-19 ENCOUNTER — Ambulatory Visit: Payer: Medicare Other | Admitting: Internal Medicine

## 2018-06-19 ENCOUNTER — Other Ambulatory Visit: Payer: Self-pay | Admitting: Internal Medicine

## 2018-06-19 ENCOUNTER — Telehealth: Payer: Self-pay | Admitting: Internal Medicine

## 2018-06-19 VITALS — BP 164/90 | HR 95 | Temp 98.2°F | Resp 22 | Ht 61.0 in | Wt 114.0 lb

## 2018-06-19 DIAGNOSIS — J9601 Acute respiratory failure with hypoxia: Secondary | ICD-10-CM | POA: Diagnosis not present

## 2018-06-19 DIAGNOSIS — J90 Pleural effusion, not elsewhere classified: Secondary | ICD-10-CM

## 2018-06-19 DIAGNOSIS — J9611 Chronic respiratory failure with hypoxia: Secondary | ICD-10-CM | POA: Diagnosis not present

## 2018-06-19 DIAGNOSIS — R600 Localized edema: Secondary | ICD-10-CM

## 2018-06-19 DIAGNOSIS — I10 Essential (primary) hypertension: Secondary | ICD-10-CM | POA: Diagnosis not present

## 2018-06-19 DIAGNOSIS — J690 Pneumonitis due to inhalation of food and vomit: Secondary | ICD-10-CM

## 2018-06-19 DIAGNOSIS — C3490 Malignant neoplasm of unspecified part of unspecified bronchus or lung: Secondary | ICD-10-CM

## 2018-06-19 DIAGNOSIS — I25118 Atherosclerotic heart disease of native coronary artery with other forms of angina pectoris: Secondary | ICD-10-CM

## 2018-06-19 DIAGNOSIS — R0602 Shortness of breath: Secondary | ICD-10-CM

## 2018-06-19 DIAGNOSIS — F419 Anxiety disorder, unspecified: Secondary | ICD-10-CM

## 2018-06-19 MED ORDER — FUROSEMIDE 20 MG PO TABS: ORAL_TABLET | ORAL | 3 refills | Status: DC

## 2018-06-19 MED ORDER — LORAZEPAM 1 MG PO TABS: 1.0000 mg | ORAL_TABLET | Freq: Three times a day (TID) | ORAL | 3 refills | Status: AC | PRN

## 2018-06-19 MED ORDER — AZITHROMYCIN 250 MG PO TABS: ORAL_TABLET | ORAL | 0 refills | Status: DC

## 2018-06-19 MED ORDER — FLUTTER DEVI: 0 refills | Status: AC

## 2018-06-19 MED ORDER — IPRATROPIUM-ALBUTEROL 0.5-2.5 (3) MG/3ML IN SOLN
3.0000 mL | Freq: Once | RESPIRATORY_TRACT | Status: AC
  Administered 2018-06-19: 3 mL via RESPIRATORY_TRACT

## 2018-06-19 MED ORDER — POTASSIUM CHLORIDE CRYS ER 20 MEQ PO TBCR: 20.0000 meq | EXTENDED_RELEASE_TABLET | Freq: Two times a day (BID) | ORAL | 1 refills | Status: AC

## 2018-06-19 MED ORDER — CEFDINIR 300 MG PO CAPS: 300.0000 mg | ORAL_CAPSULE | Freq: Two times a day (BID) | ORAL | 0 refills | Status: AC

## 2018-06-19 NOTE — Assessment & Plan Note (Signed)
BP elevated here today but has been controlled Continue current medication for now and monitor

## 2018-06-19 NOTE — Assessment & Plan Note (Signed)
Following with oncology - has appt next week Has advance metastatic lung cancer On oral chemo Chronic SOB

## 2018-06-19 NOTE — Assessment & Plan Note (Signed)
Having intermittent chest pain - last episode lat night - did not take NTG Had stress test planned  - delayed due to how she is feeling Will follow up with cardiology

## 2018-06-19 NOTE — Patient Instructions (Addendum)
Have a chest x-ray and blood work today.    Start furosemide 40 mg daily for two days and then 20 mg daily until leg swelling resolves.   Increase potassium pills to one pill twice daily.   Your ativan was increased to three times a day if needed.    A referral was ordered for pulmonary.  [facility]     If you feel any worse you need to go to the ED.

## 2018-06-19 NOTE — Telephone Encounter (Signed)
Copied from [place] [id number]. Topic: Quick Communication - See Telephone Encounter >> Jun 19, 2018  3:17 PM Sheran Luz wrote: CRM for notification. See Telephone encounter for: 06/19/18.  Orlando Penner, with [facility], calling to get clarification on the order for oxygen sent for patient. She states that the order is not valid enough for insurance to cover and has some additional questions. Please advise.    Jerri cb# [phone]

## 2018-06-19 NOTE — Assessment & Plan Note (Addendum)
Acute on chronic - has chronic SOB that is worse recently Hypoxic today  - 86% today on RA at rest metastatic lung CA and pleural effusion likely contributing Supplemental oxygen necessary - will order - needs asap If SOB and symptoms worse stressed that she needs to go to the ED - she wants to avoid the hospital if possible Sees oncology next week Referred to pulmonary - ? Need pleural effusion tapped

## 2018-06-19 NOTE — Telephone Encounter (Signed)
Spoke with Amanda Jackson and she advised in order for insurance to cover in home O2  the following have to be done below:  Dx changed from acute respiratory failure to chronic respiratory failure.   Take out of office note where you put the pt did not need O2 at discharge. (States it was listed a couple of times)  List in note that pts O2 at rest on room air was 86%  Liter flow listed in the order

## 2018-06-19 NOTE — Assessment & Plan Note (Signed)
Was on augmentin which was not effective Treated with zosyn in hospital then discharged on augmentin Cough, wheeze and sob have worsened ? Completely treated Recheck CXR given worsening of symptoms

## 2018-06-19 NOTE — Assessment & Plan Note (Signed)
Not ideally controlled Worse due to SOB Will increase ativan to TID - refilled today

## 2018-06-19 NOTE — Assessment & Plan Note (Signed)
Moderate on Ct scan - likely contributing to SOB and cough Recheck CXR today Will refer to pulm Likely contributing to hypoxia and other resp symptoms

## 2018-06-19 NOTE — Assessment & Plan Note (Addendum)
Multifactorial related to aspiration PNA, metastatic Lung ca, pleural effusion Hypoxic at rest on RA today -86% - unable to walk further due to severe SOB Wheezing on exam - neb treatment today In need of oxygen due to chronic resp failure - will order Refer to pulm

## 2018-06-19 NOTE — Assessment & Plan Note (Signed)
New since leaving the hospital 2 + pitting on exam Fluid overloaded - has mild diastolic dysfunction - this may be contributing, but may all be related to hospitalization and lungs Cmp, bmp, cxr Start lasix 40 mg daily x 2 days - and then 20 mg daily until fluid resolves Will need close follow up - will determine after tests and based on her other f/u appts

## 2018-06-20 ENCOUNTER — Telehealth: Payer: Self-pay | Admitting: Internal Medicine

## 2018-06-20 ENCOUNTER — Telehealth: Payer: Self-pay

## 2018-06-20 NOTE — Telephone Encounter (Signed)
revised 

## 2018-06-20 NOTE — Telephone Encounter (Signed)
Ria Comment from [facility] is aware.

## 2018-06-20 NOTE — Telephone Encounter (Signed)
Copied from [place] #[id number]. Topic: Quick Communication - See Telephone Encounter >> Jun 20, 2018  4:17 PM Sheran Luz wrote: CRM for notification. See Telephone encounter for: 06/20/18.   Patient was advised by pharmacy to contact office regarding Respiratory Therapy Supplies (FLUTTER) DEVI. [facility] does not keep this in stock and patient would like to know what she needs to do. Please advise.

## 2018-06-20 NOTE — Telephone Encounter (Signed)
Spoke with Ria Comment from [facility] states just wants to make sure that the note says that O2 is 86% at rest on room air not with ambulation. Thank you!

## 2018-06-20 NOTE — Telephone Encounter (Signed)
Copied from [place] [phone]. Topic: General - Other >> Jun 20, 2018 11:35 AM Virl Axe D wrote: Reason for CRM: Ria Comment with [facility] called to speak with Delice Bison. Please advise. NO#[phone] Can speak with Ria Comment or [place]

## 2018-06-21 ENCOUNTER — Other Ambulatory Visit: Payer: Self-pay | Admitting: Internal Medicine

## 2018-06-21 ENCOUNTER — Telehealth: Payer: Self-pay | Admitting: Internal Medicine

## 2018-06-21 DIAGNOSIS — J690 Pneumonitis due to inhalation of food and vomit: Secondary | ICD-10-CM

## 2018-06-21 DIAGNOSIS — J9611 Chronic respiratory failure with hypoxia: Secondary | ICD-10-CM

## 2018-06-21 NOTE — Telephone Encounter (Signed)
Send to whoever does her oxygen

## 2018-06-21 NOTE — Telephone Encounter (Signed)
Please call her and see how she is feeling.

## 2018-06-21 NOTE — Telephone Encounter (Signed)
Sent message to [name] with [facility] to let her know.

## 2018-06-22 NOTE — Telephone Encounter (Signed)
Pt states she is feeling better. Received her O2 a couple days ago and it has helped quite a bit. She did mention she has had some right sided chest pain with no other symptoms and did not know if it could be pneumonia related. Do you want her to be seen for an OV? Also has a procedure at [facility] for her paralyzed vocal cord on march 4th that was ordered by the hospital and she wanted to see if you thought she should hold off on that.

## 2018-06-22 NOTE — Telephone Encounter (Signed)
Pt aware of response below. Appointment scheduled for Monday.

## 2018-06-22 NOTE — Telephone Encounter (Signed)
Think she needs to hold off on the procedure on March 4.  Let us get her in here on Monday.  The medication she is taking should treat pneumonia

## 2018-06-25 NOTE — Progress Notes (Signed)
Subjective:    Patient ID: Amanda Jackson, female    DOB: August 08, 1940, 78 y.o.   MRN: [id number]  HPI The patient is here for follow up.  Chronic resp failure with hypoxia, aspiration PNA, metastatic lung CA, left pleural effusion:  She is still on the antibiotics.  She is using the oxygen continuously, which she states has been a life saver.  She is still coughing - it is looser.  She is using the cough syrup and that is helping.  She does feel better.  She is nervous to go without the oxygen.  She still has shortness of breath and wheezing, but both are better.  She denies any fevers or chills.  She is having central chest discomfort with coughing when she tries to adjust herself.  Bilateral LE edema, mild diastolic dysfunction:  Last week she was fluid overloaded.  She took lasic 40 mg for two days and since then is taking 20 mg daily.  Her leg swelling is much better.  She states no swelling today.  Anxiety:  Last week we increased the ativan to three times a day if needed because of increased anxiety.  She has taken it three times a day a couple of times and it did help.     Hypertension: She is taking her medication daily.  Her blood pressure here today is much better than it had been at her last visit. :  Chest pain:  Sternal pain when coughs and has to adjust herself in the chair.      Medications and allergies reviewed with patient and updated if appropriate.  Patient Active Problem List   Diagnosis Date Noted  . SOB (shortness of breath) 06/19/2018  . Pleural effusion on left 06/19/2018  . Bilateral leg edema 06/19/2018  . Aspiration pneumonia ([place]) 06/08/2018  . Respiratory failure with hypoxia ([place]) 06/08/2018  . CAD (coronary artery disease) 06/07/2018  . Right ankle swelling 05/31/2018  . Right leg pain 05/31/2018  . Bronchitis 02/17/2018  . Metastatic non-small cell lung cancer ([place]) 02/17/2018  . Lip/mouth sore 09/27/2017  . Knee osteoarthritis 09/23/2017  .  Adenocarcinoma of lung ([place]) 07/18/2017  . NSTEMI (non-ST elevated myocardial infarction) ([place])   . Chest pain 07/01/2017  . 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 Hoarseness of voice 05/24/2017  . Migraine 01/11/2017  . Persistent cough 11/24/2015  . Vasovagal syncope 10/15/2015  . Hypokalemia 08/22/2015  . Syncope 07/30/2015  . History of nasal polyps 03/25/2015  . Leg pain, posterior 11/05/2013  . Mild persistent asthma 05/08/2013  . Loss of weight 12/05/2012  . Abnormal TSH 10/11/2012  . GERD (gastroesophageal reflux disease) 04/12/2012  . Allergic rhinitis   . MVP (mitral valve prolapse)   . Tubulovillous adenoma   . Hyperlipidemia 07/20/2011  . Anxiety 04/12/2011  . Hepatic cyst 04/12/2011  . History of mitral valve prolapse 04/12/2011  . Essential hypertension 01/17/2009    Current Outpatient Medications on File Prior to Visit  Medication Sig Dispense Refill  . acetaminophen (TYLENOL ARTHRITIS PAIN) 650 MG CR tablet Take 650 mg by mouth every 8 (eight) hours as needed for pain.    Marland Kitchen alectinib (ALECENSA) 150 MG capsule Take 300 mg by mouth 2 (two) times daily with a meal.    . azithromycin (ZITHROMAX) 250 MG tablet Take two tabs the first day and then one tab daily for four days 6 tablet 0  . carvedilol (COREG) 12.5 MG  tablet Take 1 tablet (12.5 mg total) by mouth 2 (two) times daily. 180 tablet 3  . cefdinir (OMNICEF) 300 MG capsule Take 1 capsule (300 mg total) by mouth 2 (two) times daily. 20 capsule 0  . dronabinol (MARINOL) 2.5 MG capsule Take 2.5 mg by mouth 2 (two) times daily before a meal.   0  . fluticasone (FLONASE) 50 MCG/ACT nasal spray Place 1 spray into both nostrils 2 (two) times daily.     . fluticasone (FLOVENT HFA) 110 MCG/ACT inhaler Inhale 2 puffs into the lungs 2 (two) times daily. 1 Inhaler 5  . furosemide (LASIX) 20 MG tablet Take 40mg  daily x 2 days then 20 mg daily 30 tablet 3  . HYDROcodone-homatropine  (HYCODAN) 5-1.5 MG/5ML syrup Take 5 mLs by mouth every 6 (six) hours as needed for cough.    Marland Kitchen LORazepam (ATIVAN) 1 MG tablet Take 1 tablet (1 mg total) by mouth every 8 (eight) hours as needed for anxiety. 90 tablet 3  . montelukast (SINGULAIR) 10 MG tablet TAKE 1 TABLET BY MOUTH EVERY DAY (Patient taking differently: Take 10 mg by mouth at bedtime. ) 30 tablet 5  . Multiple Vitamins-Minerals (CENTRUM SILVER 50+WOMEN) TABS Take 1 tablet by mouth daily.    . nitroGLYCERIN (NITROSTAT) 0.4 MG SL tablet Place 0.4 mg under the tongue every 5 (five) minutes as needed for chest pain.    Marland Kitchen omeprazole (PRILOSEC) 20 MG capsule TAKE 1 CAPSULE (20 MG TOTAL) BY MOUTH 2 (TWO) TIMES DAILY. (Patient taking differently: Take 20 mg by mouth 2 (two) times daily. ) 180 capsule 1  . potassium chloride SA (KLOR-CON M20) 20 MEQ tablet Take 1 tablet (20 mEq total) by mouth 2 (two) times daily. 180 tablet 1  . Respiratory Therapy Supplies (FLUTTER) DEVI Use as directed 1 each 0  . traMADol (ULTRAM) 50 MG tablet Take 50 mg by mouth daily as needed (knee pain). Reported on 07/23/2015  0  . triamcinolone (KENALOG) 0.1 % paste Use as directed 1 application in the mouth or throat 2 (two) times daily. 5 g 4  . triamcinolone cream (KENALOG) 0.1 % Apply 1 application topically See admin instructions. 1 APPLICATION TO AFFECTED AREA TWICE A DAY AS NEEDED FOR RED SPOTS ON BACK  0  . VENTOLIN HFA 108 (90 Base) MCG/ACT inhaler INHALE TWO PUFFS EVERY 4 HOURS AS NEEDED FOR COUGH OR WHEEZE. (Patient taking differently: Inhale 2 puffs into the lungs every 4 (four) hours as needed for wheezing (or coughing). ) 18 Inhaler 1   No current facility-administered medications on file prior to visit.     Past Medical History:  Diagnosis Date  . Allergy   . Anxiety   . Arthritis   . Asthma   . Atypical chest pain    Normal Myoview 2008  . Breast cancer ([place])   . Colon polyp   . GERD (gastroesophageal reflux disease)   . Hx of colonoscopy  03/18/09  . Hyperlipidemia   . Hypertension   . Left nasal polyps   . MVP (mitral valve prolapse)   . Seasonal allergies   . Tubulovillous adenoma     Past Surgical History:  Procedure Laterality Date  . ABDOMINAL HYSTERECTOMY    . BREAST SURGERY  2010   biopsy-benign  . BUNIONECTOMY    . COLONOSCOPY    . FUNCTIONAL ENDOSCOPIC SINUS SURGERY  08/28/08   with bilateral ethmoidectomies and bilateral maxillary sinus ostial enlargement with removal of mucocyst, mucous membrane, and mucoid  material  . KNEE ARTHROSCOPY W/ LATERAL RELEASE  11/17/99   Left  . KNEE ARTHROSCOPY W/ PARTIAL MEDIAL MENISCECTOMY  11/17/99   left  . Laparoscopic Right Colectomy  01/25/03  . LEFT HEART CATH AND CORONARY ANGIOGRAPHY N/A 07/04/2017   Procedure: LEFT HEART CATH AND CORONARY ANGIOGRAPHY;  Surgeon: Leonie Man, MD;  Location: [facility];  Service: Cardiovascular;  Laterality: N/A;  . nasal polyp removal    . POLYPECTOMY    . TONSILLECTOMY  1952  . VIDEO BRONCHOSCOPY Bilateral 07/05/2017   Procedure: VIDEO BRONCHOSCOPY WITHOUT FLUORO;  Surgeon: Collene Gobble, MD;  Location[facility];  Service: Cardiopulmonary;  Laterality: Bilateral;  . VOCAL CORD INJECTION      Social History   Socioeconomic History  . Marital status: Divorced    Spouse name: Not on file  . Number of children: Not on file  . Years of education: Not on file  . Highest education level: Not on file  Occupational History  . Occupation: Retired    Comment: English Professor  Social Needs  . Financial resource strain: Not on file  . Food insecurity:    Worry: Not on file    Inability: Not on file  . Transportation needs:    Medical: Not on file    Non-medical: Not on file  Tobacco Use  . Smoking status: Former Smoker    Last attempt to quit: 09/09/1977    Years since quitting: 40.8  . Smokeless tobacco: Never Used  . Tobacco comment: used to smoke socially. Quit in 1979.  Substance and Sexual Activity  .  Alcohol use: Yes    Comment: usually has a glass of wine with dinner every evening  . Drug use: No  . Sexual activity: Not Currently    Birth control/protection: Other-see comments    Comment: HYST  Lifestyle  . Physical activity:    Days per week: Not on file    Minutes per session: Not on file  . Stress: Not on file  Relationships  . Social connections:    Talks on phone: Not on file    Gets together: Not on file    Attends religious service: Not on file    Active member of club or organization: Not on file    Attends meetings of clubs or organizations: Not on file    Relationship status: Not on file  Other Topics Concern  . Not on file  Social History Narrative  . Not on file    Family History  Problem Relation Age of Onset  . Hypertension Son   . Lung cancer Father   . Cancer Father   . Hypertension Father   . Liver cancer Mother   . Cancer Mother   . Hypertension Mother   . Colon cancer Mother   . Colon cancer Sister   . Heart murmur Son   . Stomach cancer Neg Hx   . Ulcerative colitis Neg Hx   . Allergic rhinitis Neg Hx   . Angioedema Neg Hx   . Asthma Neg Hx     Review of Systems  Constitutional: Negative for chills and fever.  Respiratory: Positive for cough (loose phlegm), shortness of breath and wheezing (improved).   Cardiovascular: Positive for chest pain.  Neurological: Negative for light-headedness and headaches.       Objective:   Vitals:   06/26/18 0912  BP: 100/68  Pulse: 84  Resp: 18  Temp: 98 F (36.7 C)  SpO2:  97%   BP Readings from Last 3 Encounters:  06/26/18 100/68  06/19/18 (!) 164/90  06/11/18 124/80   Wt Readings from Last 3 Encounters:  06/26/18 104 lb 12.8 oz (47.5 kg)  06/19/18 114 lb (51.7 kg)  06/08/18 111 lb 15.9 oz (50.8 kg)   Body mass index is 19.8 kg/m.   Physical Exam    Constitutional: chronically ill appearing. No distress.  HENT:  Head: Normocephalic and atraumatic.  Neck: Neck supple. No tracheal  deviation present. No thyromegaly present.  No cervical lymphadenopathy Cardiovascular: Normal rate, regular rhythm and normal heart sounds.   No murmur heard. No carotid bruit .  No edema Pulmonary/Chest: diffusely decreased breath sounds . No respiratory distress. No has no wheezes. No rales.  Skin: Skin is warm and dry. Not diaphoretic.  Psychiatric: Normal mood and affect. Behavior is normal.      Assessment & Plan:    See Problem List for Assessment and Plan of chronic medical problems.

## 2018-06-26 ENCOUNTER — Ambulatory Visit: Payer: Medicare Other | Admitting: Internal Medicine

## 2018-06-26 ENCOUNTER — Encounter: Payer: Self-pay | Admitting: Internal Medicine

## 2018-06-26 ENCOUNTER — Telehealth: Payer: Self-pay | Admitting: Emergency Medicine

## 2018-06-26 DIAGNOSIS — R079 Chest pain, unspecified: Secondary | ICD-10-CM | POA: Diagnosis not present

## 2018-06-26 DIAGNOSIS — J690 Pneumonitis due to inhalation of food and vomit: Secondary | ICD-10-CM

## 2018-06-26 DIAGNOSIS — F419 Anxiety disorder, unspecified: Secondary | ICD-10-CM

## 2018-06-26 DIAGNOSIS — J9611 Chronic respiratory failure with hypoxia: Secondary | ICD-10-CM | POA: Diagnosis not present

## 2018-06-26 DIAGNOSIS — R600 Localized edema: Secondary | ICD-10-CM

## 2018-06-26 DIAGNOSIS — I10 Essential (primary) hypertension: Secondary | ICD-10-CM | POA: Diagnosis not present

## 2018-06-26 MED ORDER — FUROSEMIDE 20 MG PO TABS: 20.0000 mg | ORAL_TABLET | Freq: Every day | ORAL | 3 refills | Status: AC | PRN

## 2018-06-26 NOTE — Telephone Encounter (Signed)
Pt's office visit will be faxed back to [facility] after pt's consult appt with Dr. Lamonte Sakai. Routing to [place] as an FYI so she can have the fax number that the OV notes need to go to.

## 2018-06-26 NOTE — Assessment & Plan Note (Signed)
Last week was not improving on Augmentin, which was discontinued Placed on azithromycin, Omnicef and doing better-complete entire course of antibiotics Hycodan cough syrup as needed Was hypoxic-improving with supplemental oxygen

## 2018-06-26 NOTE — Assessment & Plan Note (Signed)
She is experiencing chest pain that occurs with coughing and with movement Possibly related to cancer

## 2018-06-26 NOTE — Assessment & Plan Note (Signed)
Improved-last week she was fluid overloaded, which may have been partially related to her hospitalization Took Lasix 40 mg daily for 2 days and then has been taking 20 mg daily No edema today Can take lasix only as needed  - may not need it daily and better to avoid if not needed

## 2018-06-26 NOTE — Assessment & Plan Note (Signed)
Doing better today She is using oxygen continuously and states that has been a life saver.  Discussed that she may or may not need this long-term Contributing to respiratory failure is metastatic lung disease, pleural effusion and pneumonia.  Last week she was also fluid overloaded Has follow-up with oncology this week-has advanced metastatic disease.  I am unsure if she understands the extent of the disease Referred to pulmonary-not sure if pleural effusion needs to be drained, with her assessment of chronic respiratory failure

## 2018-06-26 NOTE — Assessment & Plan Note (Signed)
Continue Ativan 3 times daily as needed

## 2018-06-26 NOTE — Patient Instructions (Addendum)
Take the fluid pill ( lasix/furosemide ) as needed only.  Continue all your other medications.    Continue the oxygen.   See pulmonary as scheduled.

## 2018-06-26 NOTE — Assessment & Plan Note (Signed)
Better controlled today compared to last week Continue current medications

## 2018-06-27 NOTE — Telephone Encounter (Signed)
This consult will have to be rescheduled due to RB not being in the office.

## 2018-06-29 ENCOUNTER — Telehealth: Payer: Self-pay | Admitting: Pulmonary Disease

## 2018-06-29 ENCOUNTER — Encounter: Payer: Self-pay | Admitting: Pulmonary Disease

## 2018-06-29 ENCOUNTER — Ambulatory Visit: Payer: Medicare Other | Admitting: Pulmonary Disease

## 2018-06-29 VITALS — BP 110/70 | HR 85 | Ht 61.0 in | Wt 103.0 lb

## 2018-06-29 DIAGNOSIS — R918 Other nonspecific abnormal finding of lung field: Secondary | ICD-10-CM | POA: Diagnosis not present

## 2018-06-29 DIAGNOSIS — Z66 Do not resuscitate: Secondary | ICD-10-CM | POA: Diagnosis not present

## 2018-06-29 DIAGNOSIS — R0602 Shortness of breath: Secondary | ICD-10-CM | POA: Diagnosis not present

## 2018-06-29 DIAGNOSIS — C3492 Malignant neoplasm of unspecified part of left bronchus or lung: Secondary | ICD-10-CM

## 2018-06-29 DIAGNOSIS — J90 Pleural effusion, not elsewhere classified: Secondary | ICD-10-CM

## 2018-06-29 MED ORDER — IPRATROPIUM-ALBUTEROL 0.5-2.5 (3) MG/3ML IN SOLN: RESPIRATORY_TRACT | 0 refills | Status: AC

## 2018-06-29 NOTE — Telephone Encounter (Signed)
Patient was seen by [facility] today. Will route over to [place].

## 2018-06-29 NOTE — Telephone Encounter (Signed)
Spoke with pt, she states she requested a POC but she wants to keep the oxygen (small tanks) that she already has but thinks this has already been resolved. Nothing further is needed.

## 2018-06-29 NOTE — Progress Notes (Signed)
Synopsis: Referred in March 2020 for chronic hypoxemic respiratory failure by Binnie Rail, MD  Subjective:   PATIENT ID: Amanda Jackson GENDER: female DOB: [birth year], MRN: [id number]  Chief Complaint  Patient presents with  . Consult    She states she thinks she her anxiety makes her SOB worse. She states she has chest pain, tightness, pressure, and heaviness. She has lung cancer. She states she feels congested but she cant cough up with mucous.     Patient has a diagnosis of adenocarcinoma of the lung by Dr. Lamonte Sakai, bronchoscopy in March 2019.  She is subsequently establish care with hematology and oncology Dr. Fatima Sanger at [facility].  She was last seen by oncology on 05/18/2018.  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  She began systemic therapy with carboplatinum/Alimta/pembrolizumab on 08/25/2017.  After the cycle completed 10 with Alimta pembro.  CT scan was concerning for progression of disease and the regimen was discontinued.  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  She was treated with alectinib.  She also has a known left-sided vocal cord paralysis.  Patient was referred back to pulmonary for progressive chronic hypoxemic respiratory failure.  At this point she has been doing well.  She is a longtime smoker quit in the late 70s.  Over the past year she has been tolerating her treatments for chemotherapy.  She does have follow-up with her oncologist scheduled next week.  She is currently maintained on a pill immunotherapy.  She states that she does understand that her disease as poor prognosis.  She has spoke with the  oncology group before about hospice care.  She does state that she is a DNR.  She is trying to make sure all of the things in her life are taking care of.  Because she does know that this will eventually take her life.  She has settled with this.   Past Medical History:  Diagnosis Date  . Allergy   . Anxiety   . Arthritis   . Asthma   . Atypical chest pain    Normal Myoview 2008  . Breast cancer ([place])   . Colon polyp   . GERD (gastroesophageal reflux disease)   . Hx of colonoscopy 03/18/09  . Hyperlipidemia   . Hypertension   . Left nasal polyps   . MVP (mitral valve prolapse)   . Seasonal allergies   . Tubulovillous adenoma      Family History  Problem Relation Age of Onset  . Hypertension Son   . Lung cancer Father   . Cancer Father   . Hypertension Father   . Liver cancer Mother   . Cancer Mother   . Hypertension Mother   . Colon cancer Mother   . Colon cancer Sister   . Heart murmur Son   . Stomach cancer Neg Hx   . Ulcerative colitis Neg Hx   . Allergic rhinitis Neg Hx   . Angioedema Neg Hx   . Asthma Neg Hx      Past Surgical History:  Procedure Laterality Date  . ABDOMINAL HYSTERECTOMY    . BREAST SURGERY  2010   biopsy-benign  . BUNIONECTOMY    .  COLONOSCOPY    . FUNCTIONAL ENDOSCOPIC SINUS SURGERY  08/28/08   with bilateral ethmoidectomies and bilateral maxillary sinus ostial enlargement with removal of mucocyst, mucous membrane, and mucoid material  . KNEE ARTHROSCOPY W/ LATERAL RELEASE  11/17/99   Left  . KNEE ARTHROSCOPY W/ PARTIAL MEDIAL MENISCECTOMY  11/17/99   left  . Laparoscopic Right Colectomy  01/25/03  . LEFT HEART CATH AND CORONARY ANGIOGRAPHY N/A 07/04/2017   Procedure: LEFT HEART CATH AND CORONARY ANGIOGRAPHY;  Surgeon: Leonie Man, MD;  Location: [facility];  Service: Cardiovascular;  Laterality: N/A;  . nasal polyp removal    . POLYPECTOMY    . TONSILLECTOMY  1952  . VIDEO BRONCHOSCOPY Bilateral 07/05/2017   Procedure: VIDEO  BRONCHOSCOPY WITHOUT FLUORO;  Surgeon: Collene Gobble, MD;  Location: [facility] ENDOSCOPY;  Service: Cardiopulmonary;  Laterality: Bilateral;  . VOCAL CORD INJECTION      Social History   Socioeconomic History  . Marital status: Divorced    Spouse name: Not on file  . Number of children: Not on file  . Years of education: Not on file  . Highest education level: Not on file  Occupational History  . Occupation: Retired    Comment: English Professor  Social Needs  . Financial resource strain: Not on file  . Food insecurity:    Worry: Not on file    Inability: Not on file  . Transportation needs:    Medical: Not on file    Non-medical: Not on file  Tobacco Use  . Smoking status: Former Smoker    Last attempt to quit: 09/09/1977    Years since quitting: 40.8  . Smokeless tobacco: Never Used  . Tobacco comment: used to smoke socially. Quit in 1979.  Substance and Sexual Activity  . Alcohol use: Yes    Comment: usually has a glass of wine with dinner every evening  . Drug use: No  . Sexual activity: Not Currently    Birth control/protection: Other-see comments    Comment: HYST  Lifestyle  . Physical activity:    Days per week: Not on file    Minutes per session: Not on file  . Stress: Not on file  Relationships  . Social connections:    Talks on phone: Not on file    Gets together: Not on file    Attends religious service: Not on file    Active member of club or organization: Not on file    Attends meetings of clubs or organizations: Not on file    Relationship status: Not on file  . Intimate partner violence:    Fear of current or ex partner: Not on file    Emotionally abused: Not on file    Physically abused: Not on file    Forced sexual activity: Not on file  Other Topics Concern  . Not on file  Social History Narrative  . Not on file     Allergies  Allergen Reactions  . Tiotropium Swelling    Face and lips swell     Outpatient Medications Prior to Visit    Medication Sig Dispense Refill  . acetaminophen (TYLENOL ARTHRITIS PAIN) 650 MG CR tablet Take 650 mg by mouth every 8 (eight) hours as needed for pain.    Marland Kitchen alectinib (ALECENSA) 150 MG capsule Take 300 mg by mouth 2 (two) times daily with a meal.    . carvedilol (COREG) 12.5 MG tablet Take 1 tablet (12.5 mg total) by mouth 2 (two)  times daily. 180 tablet 3  . cefdinir (OMNICEF) 300 MG capsule Take 1 capsule (300 mg total) by mouth 2 (two) times daily. 20 capsule 0  . dronabinol (MARINOL) 2.5 MG capsule Take 2.5 mg by mouth 2 (two) times daily before a meal.   0  . fluticasone (FLONASE) 50 MCG/ACT nasal spray Place 1 spray into both nostrils 2 (two) times daily.     . fluticasone (FLOVENT HFA) 110 MCG/ACT inhaler Inhale 2 puffs into the lungs 2 (two) times daily. 1 Inhaler 5  . furosemide (LASIX) 20 MG tablet Take 1 tablet (20 mg total) by mouth daily as needed for edema. 30 tablet 3  . HYDROcodone-homatropine (HYCODAN) 5-1.5 MG/5ML syrup Take 5 mLs by mouth every 6 (six) hours as needed for cough.    Marland Kitchen LORazepam (ATIVAN) 1 MG tablet Take 1 tablet (1 mg total) by mouth every 8 (eight) hours as needed for anxiety. 90 tablet 3  . montelukast (SINGULAIR) 10 MG tablet TAKE 1 TABLET BY MOUTH EVERY DAY (Patient taking differently: Take 10 mg by mouth at bedtime. ) 30 tablet 5  . Multiple Vitamins-Minerals (CENTRUM SILVER 50+WOMEN) TABS Take 1 tablet by mouth daily.    . nitroGLYCERIN (NITROSTAT) 0.4 MG SL tablet Place 0.4 mg under the tongue every 5 (five) minutes as needed for chest pain.    Marland Kitchen omeprazole (PRILOSEC) 20 MG capsule TAKE 1 CAPSULE (20 MG TOTAL) BY MOUTH 2 (TWO) TIMES DAILY. (Patient taking differently: Take 20 mg by mouth 2 (two) times daily. ) 180 capsule 1  . potassium chloride SA (KLOR-CON M20) 20 MEQ tablet Take 1 tablet (20 mEq total) by mouth 2 (two) times daily. 180 tablet 1  . Respiratory Therapy Supplies (FLUTTER) DEVI Use as directed 1 each 0  . traMADol (ULTRAM) 50 MG tablet  Take 50 mg by mouth daily as needed (knee pain). Reported on 07/23/2015  0  . triamcinolone (KENALOG) 0.1 % paste Use as directed 1 application in the mouth or throat 2 (two) times daily. 5 g 4  . triamcinolone cream (KENALOG) 0.1 % Apply 1 application topically See admin instructions. 1 APPLICATION TO AFFECTED AREA TWICE A DAY AS NEEDED FOR RED SPOTS ON BACK  0  . VENTOLIN HFA 108 (90 Base) MCG/ACT inhaler INHALE TWO PUFFS EVERY 4 HOURS AS NEEDED FOR COUGH OR WHEEZE. (Patient taking differently: Inhale 2 puffs into the lungs every 4 (four) hours as needed for wheezing (or coughing). ) 18 Inhaler 1  . azithromycin (ZITHROMAX) 250 MG tablet Take two tabs the first day and then one tab daily for four days (Patient not taking: Reported on 06/29/2018) 6 tablet 0   No facility-administered medications prior to visit.     Review of Systems  Constitutional: Positive for malaise/fatigue and weight loss. Negative for chills and fever.  HENT: Negative for hearing loss, sore throat and tinnitus.   Eyes: Negative for blurred vision and double vision.  Respiratory: Positive for cough and shortness of breath. Negative for hemoptysis, sputum production, wheezing and stridor.   Cardiovascular: Positive for chest pain. Negative for palpitations, orthopnea, leg swelling and PND.  Gastrointestinal: Negative for abdominal pain, constipation, diarrhea, heartburn, nausea and vomiting.  Genitourinary: Negative for dysuria, hematuria and urgency.  Musculoskeletal: Positive for back pain, joint pain and myalgias.  Skin: Negative for itching and rash.  Neurological: Positive for weakness. Negative for dizziness, tingling and headaches.  Endo/Heme/Allergies: Negative for environmental allergies. Does not bruise/bleed easily.  Psychiatric/Behavioral: Positive for depression. The  patient is not nervous/anxious and does not have insomnia.   All other systems reviewed and are negative.    Objective:  Physical Exam Vitals  signs reviewed.  Constitutional:      General: She is not in acute distress.    Appearance: She is well-developed.  HENT:     Head: Normocephalic and atraumatic.     Mouth/Throat:     Pharynx: No oropharyngeal exudate.  Eyes:     Conjunctiva/sclera: Conjunctivae normal.     Pupils: Pupils are equal, round, and reactive to light.  Neck:     Vascular: No JVD.     Trachea: No tracheal deviation.     Comments: Loss of supraclavicular fat Cardiovascular:     Rate and Rhythm: Normal rate and regular rhythm.     Heart sounds: S1 normal and S2 normal.     Comments: Distant heart tones Pulmonary:     Effort: No tachypnea or accessory muscle usage.     Breath sounds: No stridor. Decreased breath sounds (throughout all lung fields) present. No wheezing, rhonchi or rales.     Comments: Manage breath sounds in the left base. Abdominal:     General: Bowel sounds are normal. There is no distension.     Palpations: Abdomen is soft.     Tenderness: There is no abdominal tenderness.  Musculoskeletal:        General: Deformity (muscle wasting ) present.  Skin:    General: Skin is warm and dry.     Capillary Refill: Capillary refill takes less than 2 seconds.     Findings: No rash.  Neurological:     Mental Status: She is alert and oriented to person, place, and time.  Psychiatric:        Behavior: Behavior normal.      Vitals:   06/29/18 1001  BP: 110/70  Pulse: 85  SpO2: 91%  Weight: 103 lb (46.7 kg)  Height: _0  (1.549 m)   91% on 2 L/min BMI Readings from Last 3 Encounters:  06/29/18 19.46 kg/m  06/26/18 19.80 kg/m  06/19/18 21.54 kg/m   Wt Readings from Last 3 Encounters:  06/29/18 103 lb (46.7 kg)  06/26/18 104 lb 12.8 oz (47.5 kg)  06/19/18 114 lb (51.7 kg)     CBC    Component Value Date/Time   WBC 7.2 06/11/2018 0251   RBC 2.71 (L) 06/11/2018 0251   HGB 7.2 (L) 06/11/2018 0251   HCT 24.3 (L) 06/11/2018 0251   PLT 454 (H) 06/11/2018 0251   MCV 89.7  06/11/2018 0251   MCH 26.6 06/11/2018 0251   MCHC 29.6 (L) 06/11/2018 0251   RDW 19.3 (H) 06/11/2018 0251   LYMPHSABS 2.0 02/03/2017 1126   MONOABS 1.1 (H) 02/03/2017 1126   EOSABS 0.5 02/03/2017 1126   BASOSABS 0.1 02/03/2017 1126    Bronchoscopy: 07/05/2017 by Dr. Lamonte Sakai Left upper lobe lung mass with completion of brushings endobronchial biopsies and transbronchial lung biopsies.  Pathology:  March 2019 pathology consistent with adenocarcinoma.  Chest Imaging: 06/07/2018: CT a chest: Left-sided lung mass with evidence of lymphangitic spread, interlobular septal thickening, masslike consolidation within the left perihilar location and left lower lobe.  Multiple clustered nodularity. Evidence of lytic lesions of the manubrium and sternal body and liver lesions. All concerning for an advanced stage congenic carcinoma.  Pulmonary Functions Testing Results: No flowsheet data found.  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  She follows at [facility] with Dr. Fatima Sanger and oncology.  She was referred to pulmonary for additional follow-up regarding chronic hypoxemic respiratory failure.  She has been maintained on 2 L oxygen therapy since her recent hospitalization. I do believe that she will likely need to continue her oxygen therapy. Review of additional images completed on this admission does reveal a obstructed lower lobe and evidence of lymphangitic spread within the left lung.  This is likely the reasons of her hypoxemia.  Today in the office we discussed her care plan.  She has already made herself a  DNR.  She has not yet discussed hospice care.  And I agree that she has been tolerating therapy very well.  I told her that she should continue to discuss this and be open to these recommendations once oncology felt like the treatment regimens that they were doing were unsuccessful.  I also informed her that if she needed a hospice physician at some point in time and her life that I would be glad to help service as this and she is to call our office to let us know.  We will get her started on a scheduled nebulizers.  I think we need to maximize the lung function available to the other lung to help her breathe is best that she can. We will also continue her DME supply orders for oxygen therapy. And we will try to help obtain a portable oxygen concentrator to help mobility as she gets in and out of the vehicle and travels from her home.  Greater than 50% of this patient's 60-minute office visit was spent face-to-face discussing above recommendations and treatment plan.   Current Outpatient Medications:  .  acetaminophen (TYLENOL ARTHRITIS PAIN) 650 MG CR tablet, Take 650 mg by mouth every 8 (eight) hours as needed for pain., Disp: , Rfl:  .  alectinib (ALECENSA) 150 MG capsule, Take 300 mg by mouth 2 (two) times daily with a meal., Disp: , Rfl:  .  carvedilol (COREG) 12.5 MG tablet, Take 1 tablet (12.5 mg total) by mouth 2 (two) times daily., Disp: 180 tablet, Rfl: 3 .  cefdinir (OMNICEF) 300 MG capsule, Take 1 capsule (300 mg total) by mouth 2 (two) times daily., Disp: 20 capsule, Rfl: 0 .  dronabinol (MARINOL) 2.5 MG capsule, Take 2.5 mg by mouth 2 (two) times daily before a meal. , Disp: , Rfl: 0 .  fluticasone (FLONASE) 50 MCG/ACT nasal spray, Place 1 spray into both nostrils 2 (two) times daily. , Disp: , Rfl:  .  fluticasone (FLOVENT HFA) 110 MCG/ACT inhaler, Inhale 2 puffs into the lungs 2 (two) times daily., Disp: 1 Inhaler, Rfl: 5 .  furosemide (LASIX) 20 MG tablet, Take 1 tablet (20 mg  total) by mouth daily as needed for edema., Disp: 30 tablet, Rfl: 3 .  HYDROcodone-homatropine (HYCODAN) 5-1.5 MG/5ML syrup, Take 5 mLs by mouth every 6 (six) hours as needed for cough., Disp: , Rfl:  .  LORazepam (ATIVAN) 1 MG tablet, Take 1 tablet (1 mg total) by mouth every 8 (eight) hours as needed for anxiety., Disp: 90 tablet, Rfl: 3 .  montelukast (SINGULAIR) 10 MG tablet, TAKE 1 TABLET BY  MOUTH EVERY DAY (Patient taking differently: Take 10 mg by mouth at bedtime. ), Disp: 30 tablet, Rfl: 5 .  Multiple Vitamins-Minerals (CENTRUM SILVER 50+WOMEN) TABS, Take 1 tablet by mouth daily., Disp: , Rfl:  .  nitroGLYCERIN (NITROSTAT) 0.4 MG SL tablet, Place 0.4 mg under the tongue every 5 (five) minutes as needed for chest pain., Disp: , Rfl:  .  omeprazole (PRILOSEC) 20 MG capsule, TAKE 1 CAPSULE (20 MG TOTAL) BY MOUTH 2 (TWO) TIMES DAILY. (Patient taking differently: Take 20 mg by mouth 2 (two) times daily. ), Disp: 180 capsule, Rfl: 1 .  potassium chloride SA (KLOR-CON M20) 20 MEQ tablet, Take 1 tablet (20 mEq total) by mouth 2 (two) times daily., Disp: 180 tablet, Rfl: 1 .  Respiratory Therapy Supplies (FLUTTER) DEVI, Use as directed, Disp: 1 each, Rfl: 0 .  traMADol (ULTRAM) 50 MG tablet, Take 50 mg by mouth daily as needed (knee pain). Reported on 07/23/2015, Disp: , Rfl: 0 .  triamcinolone (KENALOG) 0.1 % paste, Use as directed 1 application in the mouth or throat 2 (two) times daily., Disp: 5 g, Rfl: 4 .  triamcinolone cream (KENALOG) 0.1 %, Apply 1 application topically See admin instructions. 1 APPLICATION TO AFFECTED AREA TWICE A DAY AS NEEDED FOR RED SPOTS ON BACK, Disp: , Rfl: 0 .  VENTOLIN HFA 108 (90 Base) MCG/ACT inhaler, INHALE TWO PUFFS EVERY 4 HOURS AS NEEDED FOR COUGH OR WHEEZE. (Patient taking differently: Inhale 2 puffs into the lungs every 4 (four) hours as needed for wheezing (or coughing). ), Disp: 18 Inhaler, Rfl: 1   Garner Nash, DO [facility] 06/29/2018 10:35 AM

## 2018-06-29 NOTE — Patient Instructions (Addendum)
Thank you for visiting Dr. Valeta Harms at [facility]. Today we recommend the following:  We will attempt to get you a POC.  We will start you on schedule duonebs every 6 hours.   Return in about 3 months (around 09/29/2018). Follow up to be scheduled with Rexene Edison, NP.

## 2018-06-29 NOTE — Telephone Encounter (Signed)
OV notes printed and faxed. Nothing further is needed at this time.

## 2018-06-30 ENCOUNTER — Telehealth: Payer: Self-pay | Admitting: Pulmonary Disease

## 2018-06-30 DIAGNOSIS — R0602 Shortness of breath: Secondary | ICD-10-CM

## 2018-06-30 NOTE — Telephone Encounter (Signed)
[facility] is not able to send out this medication as requested by them. They are calling to let us know, they have sent the RX request out to patient't local pharmacy.

## 2018-06-30 NOTE — Telephone Encounter (Signed)
Called and spoke with patient, she is requesting that we send in the referral to [facility] for the POC. Per BI last AVS it is ok to do so.   Order placed. Nothing further needed.

## 2018-06-30 NOTE — Telephone Encounter (Signed)
Pt requesting a call back//[email or web address]

## 2018-07-03 ENCOUNTER — Other Ambulatory Visit: Payer: Self-pay | Admitting: Allergy

## 2018-07-03 MED ORDER — VENTOLIN HFA 108 (90 BASE) MCG/ACT IN AERS: 2.0000 | INHALATION_SPRAY | RESPIRATORY_TRACT | 2 refills | Status: AC | PRN

## 2018-07-15 NOTE — Progress Notes (Deleted)
Subjective:    Patient ID: Amanda [place], female    DOB: 12/09/1940, 78 y.o.   MRN: [id number]  HPI The patient is here for follow up.    Medications and allergies reviewed with patient and updated if appropriate.  Patient Active Problem List   Diagnosis Date Noted  . DNR (do not resuscitate) 06/29/2018  . SOB (shortness of breath) 06/19/2018  . Pleural effusion on left 06/19/2018  . Bilateral leg edema 06/19/2018  . Aspiration pneumonia ([place]) 06/08/2018  . Respiratory failure with hypoxia ([place]) 06/08/2018  . CAD (coronary artery disease) 06/07/2018  . Right ankle swelling 05/31/2018  . Right leg pain 05/31/2018  . Bronchitis 02/17/2018  . Metastatic non-small cell lung cancer ([place]) 02/17/2018  . Lip/mouth sore 09/27/2017  . Knee osteoarthritis 09/23/2017  . Adenocarcinoma of lung ([place]) 07/18/2017  . NSTEMI (non-ST elevated myocardial infarction) ([place])   . Chest pain 07/01/2017  . 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 Hoarseness of voice 05/24/2017  . Migraine 01/11/2017  . Persistent cough 11/24/2015  . Vasovagal syncope 10/15/2015  . Hypokalemia 08/22/2015  . Syncope 07/30/2015  . History of nasal polyps 03/25/2015  . Leg pain, posterior 11/05/2013  . Mild persistent asthma 05/08/2013  . Loss of weight 12/05/2012  . Abnormal TSH 10/11/2012  . GERD (gastroesophageal reflux disease) 04/12/2012  . Allergic rhinitis   . MVP (mitral valve prolapse)   . Tubulovillous adenoma   . Hyperlipidemia 07/20/2011  . Anxiety 04/12/2011  . Hepatic cyst 04/12/2011  . History of mitral valve prolapse 04/12/2011  . Essential hypertension 01/17/2009    Current Outpatient Medications on File Prior to Visit  Medication Sig Dispense Refill  . acetaminophen (TYLENOL ARTHRITIS PAIN) 650 MG CR tablet Take 650 mg by mouth every 8 (eight) hours as needed for pain.    Marland Kitchen alectinib (ALECENSA) 150 MG capsule Take 300 mg by mouth 2 (two) times daily  with a meal.    . carvedilol (COREG) 12.5 MG tablet Take 1 tablet (12.5 mg total) by mouth 2 (two) times daily. 180 tablet 3  . cefdinir (OMNICEF) 300 MG capsule Take 1 capsule (300 mg total) by mouth 2 (two) times daily. 20 capsule 0  . dronabinol (MARINOL) 2.5 MG capsule Take 2.5 mg by mouth 2 (two) times daily before a meal.   0  . fluticasone (FLONASE) 50 MCG/ACT nasal spray Place 1 spray into both nostrils 2 (two) times daily.     . fluticasone (FLOVENT HFA) 110 MCG/ACT inhaler Inhale 2 puffs into the lungs 2 (two) times daily. 1 Inhaler 5  . furosemide (LASIX) 20 MG tablet Take 1 tablet (20 mg total) by mouth daily as needed for edema. 30 tablet 3  . HYDROcodone-homatropine (HYCODAN) 5-1.5 MG/5ML syrup Take 5 mLs by mouth every 6 (six) hours as needed for cough.    Marland Kitchen ipratropium-albuterol (DUONEB) 0.5-2.5 (3) MG/3ML SOLN Inhale 1 vial every 6 hours and PRN for SOB Please run through Medicare Dx C34.90, R06.02 360 mL 0  . LORazepam (ATIVAN) 1 MG tablet Take 1 tablet (1 mg total) by mouth every 8 (eight) hours as needed for anxiety. 90 tablet 3  . montelukast (SINGULAIR) 10 MG tablet TAKE 1 TABLET BY MOUTH EVERY DAY (Patient taking differently: Take 10 mg by mouth at bedtime. ) 30 tablet 5  . Multiple Vitamins-Minerals (CENTRUM SILVER 50+WOMEN) TABS Take 1 tablet by mouth daily.    . nitroGLYCERIN (NITROSTAT) 0.4  MG SL tablet Place 0.4 mg under the tongue every 5 (five) minutes as needed for chest pain.    Marland Kitchen omeprazole (PRILOSEC) 20 MG capsule TAKE 1 CAPSULE (20 MG TOTAL) BY MOUTH 2 (TWO) TIMES DAILY. (Patient taking differently: Take 20 mg by mouth 2 (two) times daily. ) 180 capsule 1  . potassium chloride SA (KLOR-CON M20) 20 MEQ tablet Take 1 tablet (20 mEq total) by mouth 2 (two) times daily. 180 tablet 1  . Respiratory Therapy Supplies (FLUTTER) DEVI Use as directed 1 each 0  . traMADol (ULTRAM) 50 MG tablet Take 50 mg by mouth daily as needed (knee pain). Reported on 07/23/2015  0  .  triamcinolone (KENALOG) 0.1 % paste Use as directed 1 application in the mouth or throat 2 (two) times daily. 5 g 4  . triamcinolone cream (KENALOG) 0.1 % Apply 1 application topically See admin instructions. 1 APPLICATION TO AFFECTED AREA TWICE A DAY AS NEEDED FOR RED SPOTS ON BACK  0  . VENTOLIN HFA 108 (90 Base) MCG/ACT inhaler Inhale 2 puffs into the lungs every 4 (four) hours as needed for wheezing (or coughing). 1 Inhaler 2   No current facility-administered medications on file prior to visit.     Past Medical History:  Diagnosis Date  . Allergy   . Anxiety   . Arthritis   . Asthma   . Atypical chest pain    Normal Myoview 2008  . Breast cancer ([place])   . Colon polyp   . GERD (gastroesophageal reflux disease)   . Hx of colonoscopy 03/18/09  . Hyperlipidemia   . Hypertension   . Left nasal polyps   . MVP (mitral valve prolapse)   . Seasonal allergies   . Tubulovillous adenoma     Past Surgical History:  Procedure Laterality Date  . ABDOMINAL HYSTERECTOMY    . BREAST SURGERY  2010   biopsy-benign  . BUNIONECTOMY    . COLONOSCOPY    . FUNCTIONAL ENDOSCOPIC SINUS SURGERY  08/28/08   with bilateral ethmoidectomies and bilateral maxillary sinus ostial enlargement with removal of mucocyst, mucous membrane, and mucoid material  . KNEE ARTHROSCOPY W/ LATERAL RELEASE  11/17/99   Left  . KNEE ARTHROSCOPY W/ PARTIAL MEDIAL MENISCECTOMY  11/17/99   left  . Laparoscopic Right Colectomy  01/25/03  . LEFT HEART CATH AND CORONARY ANGIOGRAPHY N/A 07/04/2017   Procedure: LEFT HEART CATH AND CORONARY ANGIOGRAPHY;  Surgeon: Leonie Man, MD;  Location: [facility];  Service: Cardiovascular;  Laterality: N/A;  . nasal polyp removal    . POLYPECTOMY    . TONSILLECTOMY  1952  . VIDEO BRONCHOSCOPY Bilateral 07/05/2017   Procedure: VIDEO BRONCHOSCOPY WITHOUT FLUORO;  Surgeon: Collene Gobble, MD;  Location: [facility];  Service: Cardiopulmonary;  Laterality: Bilateral;  . VOCAL CORD  INJECTION      Social History   Socioeconomic History  . Marital status: Divorced    Spouse name: Not on file  . Number of children: Not on file  . Years of education: Not on file  . Highest education level: Not on file  Occupational History  . Occupation: Retired    Comment: English Professor  Social Needs  . Financial resource strain: Not on file  . Food insecurity:    Worry: Not on file    Inability: Not on file  . Transportation needs:    Medical: Not on file    Non-medical: Not on file  Tobacco Use  . Smoking  status: Former Smoker    Last attempt to quit: 09/09/1977    Years since quitting: 40.8  . Smokeless tobacco: Never Used  . Tobacco comment: used to smoke socially. Quit in 1979.  Substance and Sexual Activity  . Alcohol use: Yes    Comment: usually has a glass of wine with dinner every evening  . Drug use: No  . Sexual activity: Not Currently    Birth control/protection: Other-see comments    Comment: HYST  Lifestyle  . Physical activity:    Days per week: Not on file    Minutes per session: Not on file  . Stress: Not on file  Relationships  . Social connections:    Talks on phone: Not on file    Gets together: Not on file    Attends religious service: Not on file    Active member of club or organization: Not on file    Attends meetings of clubs or organizations: Not on file    Relationship status: Not on file  Other Topics Concern  . Not on file  Social History Narrative  . Not on file    Family History  Problem Relation Age of Onset  . Hypertension Son   . Lung cancer Father   . Cancer Father   . Hypertension Father   . Liver cancer Mother   . Cancer Mother   . Hypertension Mother   . Colon cancer Mother   . Colon cancer Sister   . Heart murmur Son   . Stomach cancer Neg Hx   . Ulcerative colitis Neg Hx   . Allergic rhinitis Neg Hx   . Angioedema Neg Hx   . Asthma Neg Hx     Review of Systems     Objective:  There were no vitals  filed for this visit. BP Readings from Last 3 Encounters:  06/29/18 110/70  06/26/18 100/68  06/19/18 (!) 164/90   Wt Readings from Last 3 Encounters:  06/29/18 103 lb (46.7 kg)  06/26/18 104 lb 12.8 oz (47.5 kg)  06/19/18 114 lb (51.7 kg)   There is no height or weight on file to calculate BMI.   Physical Exam    Constitutional: Appears well-developed and well-nourished. No distress.  HENT:  Head: Normocephalic and atraumatic.  Neck: Neck supple. No tracheal deviation present. No thyromegaly present.  No cervical lymphadenopathy Cardiovascular: Normal rate, regular rhythm and normal heart sounds.   No murmur heard. No carotid bruit .  No edema Pulmonary/Chest: Effort normal and breath sounds normal. No respiratory distress. No has no wheezes. No rales.  Skin: Skin is warm and dry. Not diaphoretic.  Psychiatric: Normal mood and affect. Behavior is normal.      Assessment & Plan:    See Problem List for Assessment and Plan of chronic medical problems.

## 2018-07-17 ENCOUNTER — Inpatient Hospital Stay: Payer: Medicare Other | Admitting: Internal Medicine

## 2018-07-26 ENCOUNTER — Telehealth: Payer: Self-pay | Admitting: Internal Medicine

## 2018-07-26 NOTE — Telephone Encounter (Signed)
Gave ok for verbal orders.

## 2018-07-26 NOTE — Telephone Encounter (Signed)
Tried calling the number listed below. No answer and no VM set up.

## 2018-07-26 NOTE — Telephone Encounter (Signed)
Copied from [place] [id number]. Topic: General - Other >> Jul 26, 2018  8:31 AM Keene Breath wrote: Reason for CRM: Tanya with [facility] called to request verbal orders for patient for nursing, OT and PT for eval and treatment.  Please advise and call back with any questions at [phone]

## 2018-07-28 NOTE — Telephone Encounter (Signed)
[place] calling back from home health called and stated that the patient ask for a rescheduled and will not go out on 07/30/18. Please advise

## 2018-07-31 ENCOUNTER — Telehealth: Payer: Self-pay | Admitting: Internal Medicine

## 2018-07-31 NOTE — Telephone Encounter (Signed)
Copied from [place] [phone]. Topic: Quick Communication - Home Health Verbal Orders >> Jul 31, 2018  4:58 PM Loma Boston wrote: CRM for notification. See Telephone encounter for: 07/31/18. Radovan OT called with [facility] and is asking for a verbal for [facility] physical therapy for 5 wks (2) times a week, he is just leaving the pt and will count today's visit as 1 of the visits for this week.  Please leave him a message at [phone]

## 2018-08-01 ENCOUNTER — Telehealth: Payer: Self-pay | Admitting: Internal Medicine

## 2018-08-01 DIAGNOSIS — R2681 Unsteadiness on feet: Secondary | ICD-10-CM

## 2018-08-01 NOTE — Telephone Encounter (Signed)
Copied from [place] [id number]. Topic: Quick Communication - Home Health Verbal Orders >> Aug 01, 2018  4:23 PM Rayann Heman wrote: Caller/Agency: [facility] called and stated that she would like an RX Rolator walker   [phone]

## 2018-08-01 NOTE — Telephone Encounter (Signed)
Printed

## 2018-08-01 NOTE — Telephone Encounter (Signed)
LVM letting Radovan know that Dr. Quay Burow is ok with orders listed below.

## 2018-08-02 ENCOUNTER — Telehealth: Payer: Self-pay | Admitting: Internal Medicine

## 2018-08-02 NOTE — Telephone Encounter (Signed)
Rx faxed over to [facility] home health

## 2018-08-02 NOTE — Telephone Encounter (Signed)
Called kelly no answer LMOM w/MD response...Johny Chess

## 2018-08-02 NOTE — Telephone Encounter (Signed)
Copied from [place] [phone]. Topic: Quick Communication - Home Health Verbal Orders >> Aug 02, 2018 11:21 AM Berneta Levins wrote: Caller/Agency: Claiborne Billings with [facility] Number: [phone], OK to leave message Requesting OT/PT/Skilled Nursing/Social Work/Speech Therapy: home health aid Frequency: 2x a week for 4 weeks.

## 2018-08-02 NOTE — Telephone Encounter (Signed)
ok 

## 2018-08-03 ENCOUNTER — Telehealth: Payer: Self-pay | Admitting: *Deleted

## 2018-08-03 NOTE — Telephone Encounter (Signed)
Ok noted  

## 2018-08-03 NOTE — Telephone Encounter (Signed)
Copied from [place] [phone]. Topic: General - Other >> Aug 03, 2018  9:05 AM Celene Kras A wrote: Reason for CRM: Jalene Mullet, a physical therapist, called stating pt refused physical therapy. Please advise.

## 2018-08-06 ENCOUNTER — Other Ambulatory Visit: Payer: Self-pay | Admitting: Cardiology

## 2018-08-11 ENCOUNTER — Telehealth: Payer: Self-pay | Admitting: Internal Medicine

## 2018-08-11 NOTE — Telephone Encounter (Signed)
Copied from [place] [phone]. Topic: Quick Communication - Home Health Verbal Orders >> Aug 11, 2018 11:34 AM Sheran Luz wrote: Caller/Agency: [facility] Number: [phone]  Requesting OT be resumed next week- Patient is not feeling well today.

## 2018-08-11 NOTE — Telephone Encounter (Signed)
noted 

## 2018-08-13 ENCOUNTER — Other Ambulatory Visit: Payer: Self-pay

## 2018-08-13 ENCOUNTER — Encounter (HOSPITAL_COMMUNITY): Payer: Self-pay | Admitting: *Deleted

## 2018-08-13 ENCOUNTER — Emergency Department (HOSPITAL_COMMUNITY): Payer: Medicare Other

## 2018-08-13 ENCOUNTER — Inpatient Hospital Stay (HOSPITAL_COMMUNITY)
Admission: EM | Admit: 2018-08-13 | Discharge: 2018-08-25 | DRG: 296 | Disposition: E | Payer: Medicare Other | Attending: Internal Medicine | Admitting: Internal Medicine

## 2018-08-13 DIAGNOSIS — R402312 Coma scale, best motor response, none, at arrival to emergency department: Secondary | ICD-10-CM | POA: Diagnosis present

## 2018-08-13 DIAGNOSIS — J9600 Acute respiratory failure, unspecified whether with hypoxia or hypercapnia: Secondary | ICD-10-CM | POA: Diagnosis present

## 2018-08-13 DIAGNOSIS — Z923 Personal history of irradiation: Secondary | ICD-10-CM

## 2018-08-13 DIAGNOSIS — I469 Cardiac arrest, cause unspecified: Secondary | ICD-10-CM | POA: Diagnosis present

## 2018-08-13 DIAGNOSIS — Z87891 Personal history of nicotine dependence: Secondary | ICD-10-CM | POA: Diagnosis not present

## 2018-08-13 DIAGNOSIS — R000 Tachycardia, unspecified: Secondary | ICD-10-CM | POA: Diagnosis not present

## 2018-08-13 DIAGNOSIS — C771 Secondary and unspecified malignant neoplasm of intrathoracic lymph nodes: Secondary | ICD-10-CM | POA: Diagnosis present

## 2018-08-13 DIAGNOSIS — R402112 Coma scale, eyes open, never, at arrival to emergency department: Secondary | ICD-10-CM | POA: Diagnosis present

## 2018-08-13 DIAGNOSIS — C3492 Malignant neoplasm of unspecified part of left bronchus or lung: Secondary | ICD-10-CM | POA: Diagnosis present

## 2018-08-13 DIAGNOSIS — R402212 Coma scale, best verbal response, none, at arrival to emergency department: Secondary | ICD-10-CM | POA: Diagnosis present

## 2018-08-13 DIAGNOSIS — Z66 Do not resuscitate: Secondary | ICD-10-CM | POA: Diagnosis present

## 2018-08-13 DIAGNOSIS — G9341 Metabolic encephalopathy: Secondary | ICD-10-CM | POA: Diagnosis present

## 2018-08-13 DIAGNOSIS — C799 Secondary malignant neoplasm of unspecified site: Secondary | ICD-10-CM | POA: Diagnosis present

## 2018-08-13 DIAGNOSIS — Z515 Encounter for palliative care: Secondary | ICD-10-CM | POA: Diagnosis not present

## 2018-08-13 DIAGNOSIS — Z8674 Personal history of sudden cardiac arrest: Secondary | ICD-10-CM

## 2018-08-13 DIAGNOSIS — J90 Pleural effusion, not elsewhere classified: Secondary | ICD-10-CM | POA: Diagnosis present

## 2018-08-13 HISTORY — DX: Malignant (primary) neoplasm, unspecified: C80.1

## 2018-08-13 LAB — HEPATIC FUNCTION PANEL
ALT: 104 U/L — ABNORMAL HIGH (ref 0–44)
AST: 172 U/L — ABNORMAL HIGH (ref 15–41)
Albumin: 2 g/dL — ABNORMAL LOW (ref 3.5–5.0)
Alkaline Phosphatase: 108 U/L (ref 38–126)
Bilirubin, Direct: <0.1 mg/dL (ref 0.0–0.2)
Total Bilirubin: 0.7 mg/dL (ref 0.3–1.2)
Total Protein: 5.9 g/dL — ABNORMAL LOW (ref 6.5–8.1)

## 2018-08-13 LAB — BASIC METABOLIC PANEL
Anion gap: 14 (ref 5–15)
BUN: 16 mg/dL (ref 8–23)
CO2: 21 mmol/L — ABNORMAL LOW (ref 22–32)
Calcium: 9.5 mg/dL (ref 8.9–10.3)
Chloride: 109 mmol/L (ref 98–111)
Creatinine, Ser: 1.04 mg/dL — ABNORMAL HIGH (ref 0.44–1.00)
GFR calc Af Amer: >60 mL/min (ref >60)
GFR calc non Af Amer: 52 mL/min — ABNORMAL LOW (ref >60)
Glucose, Bld: 246 mg/dL — ABNORMAL HIGH (ref 70–99)
Potassium: 4.4 mmol/L (ref 3.5–5.1)
Sodium: 144 mmol/L (ref 135–145)

## 2018-08-13 LAB — CBC WITH DIFFERENTIAL/PLATELET
Abs Immature Granulocytes: 0.44 10*3/uL — ABNORMAL HIGH (ref 0.00–0.07)
Basophils Absolute: 0 10*3/uL (ref 0.0–0.1)
Basophils Relative: 0 %
Eosinophils Absolute: 0.1 10*3/uL (ref 0.0–0.5)
Eosinophils Relative: 1 %
HCT: 31.8 % — ABNORMAL LOW (ref 36.0–46.0)
Hemoglobin: 8.5 g/dL — ABNORMAL LOW (ref 12.0–15.0)
Immature Granulocytes: 4 %
Lymphocytes Relative: 28 %
Lymphs Abs: 3.5 10*3/uL (ref 0.7–4.0)
MCH: 25.8 pg — ABNORMAL LOW (ref 26.0–34.0)
MCHC: 26.7 g/dL — ABNORMAL LOW (ref 30.0–36.0)
MCV: 96.7 fL (ref 80.0–100.0)
Monocytes Absolute: 0.5 10*3/uL (ref 0.1–1.0)
Monocytes Relative: 4 %
Neutro Abs: 8 10*3/uL — ABNORMAL HIGH (ref 1.7–7.7)
Neutrophils Relative %: 63 %
Platelets: 349 10*3/uL (ref 150–400)
RBC: 3.29 MIL/uL — ABNORMAL LOW (ref 3.87–5.11)
RDW: 16.5 % — ABNORMAL HIGH (ref 11.5–15.5)
WBC: 12.5 10*3/uL — ABNORMAL HIGH (ref 4.0–10.5)
nRBC: 0.2 % (ref 0.0–0.2)

## 2018-08-13 LAB — POCT I-STAT 7, (LYTES, BLD GAS, ICA,H+H)
Acid-base deficit: 1 mmol/L (ref 0.0–2.0)
Bicarbonate: 25.6 mmol/L (ref 20.0–28.0)
Calcium, Ion: 1.44 mmol/L — ABNORMAL HIGH (ref 1.15–1.40)
HCT: 26 % — ABNORMAL LOW (ref 36.0–46.0)
Hemoglobin: 8.8 g/dL — ABNORMAL LOW (ref 12.0–15.0)
O2 Saturation: 97 %
Patient temperature: 98
Potassium: 3.5 mmol/L (ref 3.5–5.1)
Sodium: 141 mmol/L (ref 135–145)
TCO2: 27 mmol/L (ref 22–32)
pCO2 arterial: 49.1 mmHg — ABNORMAL HIGH (ref 32.0–48.0)
pH, Arterial: 7.324 — ABNORMAL LOW (ref 7.350–7.450)
pO2, Arterial: 102 mmHg (ref 83.0–108.0)

## 2018-08-13 LAB — MRSA PCR SCREENING: MRSA by PCR: NEGATIVE

## 2018-08-13 LAB — I-STAT TROPONIN, ED: Troponin i, poc: 0.05 ng/mL (ref 0.00–0.08)

## 2018-08-13 LAB — CBG MONITORING, ED: Glucose-Capillary: 160 mg/dL — ABNORMAL HIGH (ref 70–99)

## 2018-08-13 MED ORDER — FENTANYL CITRATE (PF) 100 MCG/2ML IJ SOLN
25.0000 ug | INTRAMUSCULAR | Status: DC | PRN
  Administered 2018-08-13: 21:00:00 25 ug via INTRAVENOUS
  Administered 2018-08-14: 50 ug via INTRAVENOUS
  Administered 2018-08-14 (×6): 100 ug via INTRAVENOUS
  Filled 2018-08-13 (×8): qty 2

## 2018-08-13 MED ORDER — VANCOMYCIN HCL IN DEXTROSE 750-5 MG/150ML-% IV SOLN
750.0000 mg | INTRAVENOUS | Status: DC
  Filled 2018-08-13: qty 150

## 2018-08-13 MED ORDER — FAMOTIDINE IN NACL 20-0.9 MG/50ML-% IV SOLN
20.0000 mg | Freq: Two times a day (BID) | INTRAVENOUS | Status: DC
  Administered 2018-08-13 – 2018-08-14 (×3): 20 mg via INTRAVENOUS
  Filled 2018-08-13 (×4): qty 50

## 2018-08-13 MED ORDER — SODIUM CHLORIDE 0.9 % IV SOLN
250.0000 mL | INTRAVENOUS | Status: DC
  Administered 2018-08-13 – 2018-08-14 (×2): 250 mL via INTRAVENOUS

## 2018-08-13 MED ORDER — VANCOMYCIN HCL IN DEXTROSE 1-5 GM/200ML-% IV SOLN
1000.0000 mg | Freq: Once | INTRAVENOUS | Status: AC
  Administered 2018-08-13: 07:00:00 1000 mg via INTRAVENOUS
  Filled 2018-08-13: qty 200

## 2018-08-13 MED ORDER — ONDANSETRON HCL 4 MG/2ML IJ SOLN: 4.0000 mg | Freq: Four times a day (QID) | INTRAMUSCULAR | Status: DC | PRN

## 2018-08-13 MED ORDER — PIPERACILLIN-TAZOBACTAM 3.375 G IVPB
3.3750 g | Freq: Three times a day (TID) | INTRAVENOUS | Status: DC
  Administered 2018-08-13 – 2018-08-14 (×4): 3.375 g via INTRAVENOUS
  Filled 2018-08-13 (×4): qty 50

## 2018-08-13 MED ORDER — EPINEPHRINE PF 1 MG/ML IJ SOLN
0.5000 ug/min | INTRAVENOUS | Status: DC
  Administered 2018-08-13: 10:00:00 10 ug/min via INTRAVENOUS
  Administered 2018-08-13: 04:00:00 5 ug/min via INTRAVENOUS
  Filled 2018-08-13 (×3): qty 4

## 2018-08-13 MED ORDER — CHLORHEXIDINE GLUCONATE 0.12% ORAL RINSE (MEDLINE KIT)
15.0000 mL | Freq: Two times a day (BID) | OROMUCOSAL | Status: DC
  Administered 2018-08-13 – 2018-08-14 (×3): 15 mL via OROMUCOSAL

## 2018-08-13 MED ORDER — ACETAMINOPHEN 325 MG PO TABS: 650.0000 mg | ORAL_TABLET | ORAL | Status: DC | PRN

## 2018-08-13 MED ORDER — ENOXAPARIN SODIUM 40 MG/0.4ML ~~LOC~~ SOLN
40.0000 mg | SUBCUTANEOUS | Status: DC
  Administered 2018-08-13: 40 mg via SUBCUTANEOUS
  Filled 2018-08-13: qty 0.4

## 2018-08-13 MED ORDER — PIPERACILLIN-TAZOBACTAM 3.375 G IVPB 30 MIN
3.3750 g | Freq: Once | INTRAVENOUS | Status: AC
  Administered 2018-08-13: 07:00:00 3.375 g via INTRAVENOUS
  Filled 2018-08-13: qty 50

## 2018-08-13 MED ORDER — FENTANYL CITRATE (PF) 100 MCG/2ML IJ SOLN: 25.0000 ug | INTRAMUSCULAR | Status: DC | PRN

## 2018-08-13 MED ORDER — ORAL CARE MOUTH RINSE
15.0000 mL | OROMUCOSAL | Status: DC
  Administered 2018-08-13 – 2018-08-14 (×12): 15 mL via OROMUCOSAL

## 2018-08-13 MED ORDER — ENOXAPARIN SODIUM 40 MG/0.4ML ~~LOC~~ SOLN: 40.0000 mg | SUBCUTANEOUS | Status: DC

## 2018-08-13 MED ORDER — ENOXAPARIN SODIUM 30 MG/0.3ML ~~LOC~~ SOLN: 30.0000 mg | SUBCUTANEOUS | Status: DC

## 2018-08-13 NOTE — Progress Notes (Signed)
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 She is being treated for cardiac vs septic shock with epinephrine drip, vanc/zosyn and remains vent dependent.   Patient intermittently opens eyes to withdraws to pain. MAP is at goal with epinephrine drip.   Was able to get in contact with patient's husband Kyung Rudd. They are still waiting on patient's only son to arrive from [place]. He states he knows Ms. [place] did not want to be on a ventilator but family feels it's their obligation for her only son to be at her side if possible when withdrawing care. I provided support. Kyung Rudd was appreciative and had no further questions. He was given the unit number to call with names of who was to be at bedside.  Will continue current treatments with no further escalation of care.  Alphonzo Grieve, MD PGY3 Pager [phone]

## 2018-08-13 NOTE — H&P (Addendum)
NAMEDesha [place], MRN:  [id number], DOB:  1941-03-27, LOS: 0 ADMISSION DATE:  08/08/2018, CONSULTATION DATE:  08/23/2018 REFERRING MD:  Amanda [place], CHIEF COMPLAINT: Cardiac arrest  History of present illness   Amanda [place] is a 78 year old female with history significant for lung cancer diagnosed about a year ago for which she follows at [facility]  She presented to the [place] via EMS after she was found down at home.  History obtained from patient's husband.  He states that 2 weeks ago patient was having progressive shortness of breath.  He was seen at [facility] and was found to have a pleural effusion for which she underwent thoracentesis.  He states that after that patient was doing remarkably better and her shortness of breath markedly improved.  She has not been having any fever or chills.  She has been having a cough related to her cancer and has been on anti-tussive.  Patient's husband states that all day long the patient was talking about her death and was worried about how her husband would do if she died.  Later at night patient was diaphoretic without fever.  She then went to lay down and suddenly called out for her husband.  Husband states that when he went to go check on her in the bedroom she was unresponsive.  He called EMS started CPR.  She had about 27 minutes of CPR before achieving ROSC. She was given epinephrine 9x.  Initial rhythm was asystole.  She then went into cardiac arrest again with additional 49minutes of CPR.   Patient's husband states that the patient had always expressed that she never wanted to be resuscitated up and put on a ventilator.  Patient has a son who lives in [place] and he will be traveling to come and see his mother.  Past Medical History  -Lung cancer  Significant Hospital Events   -Cardiac arrest -Intubation  Consults:  -PCCM  Procedures:  -Endotracheal intubation 08/15/2018  Significant Diagnostic Tests:  CXR Patchy bilateral  infiltrates left greater than right. Associated left pleural effusion is noted.  Micro Data:  -Pending   Antimicrobials:  -Vanc and zosyn  Objective   Blood pressure 108/71, pulse 80, temperature (!) 94.8 F (34.9 C), temperature source Rectal, resp. rate 16, height 5\' 6"  (1.676 m), weight 47.3 kg, SpO2 100 %.    Vent Mode: PRVC FiO2 (%):  [100 %] 100 % Set Rate:  [16 bmp] 16 bmp Vt Set:  [470 mL] 470 mL PEEP:  [5 cmH20] 5 cmH20 Plateau Pressure:  [22 cmH20-23 cmH20] 23 cmH20  No intake or output data in the 24 hours ending 08/20/2018 0702 Filed Weights   08/10/2018 0337 07/27/2018 6301  Weight: 45.4 kg 47.3 kg    Examination: General: Intubated and ventilated  HENT: Oral ET tube Lungs: Clear to auscultation anteriorly Cardiovascular: Sinus rhythm, S1-S2 normal no added sound Abdomen: Soft, no organomegaly Extremities: Mild lower extremity edema Neuro: Completely unresponsive, GCS 3 GU: Normal  Assessment & Plan:  #Cardiac arrest #Acute respiratory failure requiring mechanical ventilation #Undifferentiated shock #History of Lung cancer  #Goals of care   Unclear cause of cardiac arrest leading to acute respiratory failure requiring mechanical ventilation.  Patient is currently in shock but is undifferentiated that developed after cardiac arrest could be due to hypovolemic shock versus cardiogenic shock versus septic shock.  Requiring vasopressors to maintain maps more than 65.  I had an extensive discussion with patient's husband.  He states that patient  had clearly expressed her wishes that she would not want to be intubated or have CPR.  However patient did get CPR and was intubated to give time for the son to get here and say his goodbyes.  He however clearly states that patient would not want excessive invasive procedures including central lines.  Discussed with the husband that we will continue with the care we are giving right now but should her heart stop beating, i  recommend that we should not do CPR again and he is in agreement with this. -Pan culture -Will start on broad-spectrum antibiotics for possible sepsis -Continue epinephrine -Titrate to map of 65 -Lung protective ventilation strategies -Obtain ABG to ensure adequate ventilation -Palliative care consult -Hold off on placing central and arterial lines -Readdress goals of care with patient's son and husband once the son gets here from [place].  Husband states that at that time they would like to withdraw care once the son gets here   Best practice:  Diet: N.p.o., dietitian evaluation for resection of tube feeds Pain/Anxiety/Delirium protocol (if indicated): PRN fentanyl VAP protocol (if indicated): Head of bed elevation, famotidine, oral care DVT prophylaxis: Enoxaparin GI prophylaxis: Famotidine Glucose control: Continue euglycemia Mobility: N/A Code Status: Patient is DNR Family Communication: Goals of care discussions with patient husband  Disposition: admit to ICU   Labs   CBC: Recent Labs  Lab 08/05/2018 0335 08/18/2018 0602  WBC 12.5*  --   NEUTROABS 8.0*  --   HGB 8.5* 8.8*  HCT 31.8* 26.0*  MCV 96.7  --   PLT 349  --     Basic Metabolic Panel: Recent Labs  Lab 08/01/2018 0335 08/03/2018 0602  NA 144 141  K 4.4 3.5  CL 109  --   CO2 21*  --   GLUCOSE 246*  --   BUN 16  --   CREATININE 1.04*  --   CALCIUM 9.5  --    GFR: Estimated Creatinine Clearance: 33.8 mL/min (A) (by C-G formula based on SCr of 1.04 mg/dL (H)). Recent Labs  Lab 07/26/2018 0335  WBC 12.5*   ABG    Component Value Date/Time   PHART 7.324 (L) 07/31/2018 0602   PCO2ART 49.1 (H) 08/22/2018 0602   PO2ART 102.0 07/28/2018 0602   HCO3 25.6 08/05/2018 0602   TCO2 27 08/11/2018 0602   ACIDBASEDEF 1.0 08/21/2018 0602   O2SAT 97.0 08/18/2018 0602     CBG: Recent Labs  Lab 08/16/2018 0341  GLUCAP 160*    Review of Systems:   Unable to obtain as patient is intubated   Past Medical  History  She,  has a past medical history of Cancer ([place]).   Surgical History    Past Surgical History:  Procedure Laterality Date  . LUNG SURGERY       Social History      Family History   Her family history is not on file.   Allergies Allergies not on file   Home Medications  Prior to Admission medications   Not on File     Critical care time: The patient is critically ill with multiple organ systems failure and requires high complexity decision making for assessment and support, frequent evaluation and titration of therapies, application of advanced monitoring technologies and extensive interpretation of multiple databases.   Critical Care Time devoted to patient care services described in this note is  60 Minutes. This time reflects time of care of this signee Dr. Oswald Hillock. This critical care time  does not reflect procedure time, or teaching time or supervisory time of PA/NP/Med student/Med Resident etc but could involve care discussion time.  Oswald Hillock, M.D. [facility] Pulmonary/Critical Care Medicine. Pager:  After hours pager: [phone].

## 2018-08-13 NOTE — ED Triage Notes (Addendum)
Patient presents to ED via [facility] states she was last seen normal 15 PTA of ems. Patient was talking with family and was sleeping in her room and realized patient wasn't responding. CPR started by FD at [place] and return of [place] patient was given Epip x 9 and started on Epip drip, patient went asystolic at 1031 and return of ROSC at [place]. Thelma Comp with Hepta filter per FD> upon arrival to ED patient in NSR rate at 60.  Per ems patient was recently Dx. With lung Cancer and had procedure at [facility]  In  the last several weeks.

## 2018-08-13 NOTE — ED Notes (Signed)
Bear hugger applied 

## 2018-08-13 NOTE — ED Notes (Signed)
Husband's Cell : [phone]

## 2018-08-13 NOTE — ED Provider Notes (Signed)
[facility] EMERGENCY DEPARTMENT Provider Note   CSN: [id number] Arrival date & time: 07/29/2018  0316    History   Chief Complaint Chief Complaint  Patient presents with  . Cardiac Arrest    HPI Amanda Jackson is a 78 y.o. female.   The history is provided by the EMS personnel. The history is limited by the condition of the patient (Unresponsive).  She has a history of lung cancer and was brought in post CPR.  She had an unwitnessed arrest at home with CPR started by first responders.  She had about 27 minutes of CPR before she got return of spontaneous circulation.  Initial rhythm was asystole.  She lost pulses again and was given additional epinephrine with additional CPR for 16 minutes, and was started on an epinephrine drip.  She had received a total of 9 mg of epinephrine via IV push.  King airway had been inserted in the field.  She arrived with palpable pulses.  Status of her lung cancer is unknown, and other history is not known.  Past Medical History:  Diagnosis Date  . Cancer ([place])    lung cancer    There are no active problems to display for this patient.   Past Surgical History:  Procedure Laterality Date  . LUNG SURGERY       OB History   No obstetric history on file.      Home Medications    Prior to Admission medications   Not on File    Family History No family history on file.  Social History Social History   Tobacco Use  . Smoking status: Unknown If Ever Smoked  Substance Use Topics  . Alcohol use: Not on file  . Drug use: Not on file     Allergies   Patient has no allergy information on record.   Review of Systems Review of Systems  Unable to perform ROS: Patient unresponsive     Physical Exam Updated Vital Signs BP (!) 71/32 (BP Location: Right Arm)   Pulse 60   Temp (!) 95.1 F (35.1 C) (Temporal)   Ht 5\' 6"  (1.676 m)   Wt 45.4 kg   SpO2 100%   BMI 16.14 kg/m   Physical Exam Vitals signs and  nursing note reviewed.    78 year old female, King airway in place and respirations being given via Ambu bag. Vital signs are significant for hypotension. Oxygen saturation is 100%, which is normal. Head is normocephalic and atraumatic.  Pupils 4 mm and minimally reactive.  King airway in place. Neck appears to show JVD, but difficult to assess since she is supine. Lungs have symmetric airflow. Chest has no obvious deformity.  Markings present which appear to be for radiation treatment. Heart has regular rate and rhythm without murmur. Abdomen is soft, flat. Extremities have 1+ edema, full range of motion is present. Skin is warm and dry without rash. Neurologic: GCS=3.  ED Treatments / Results  Labs (all labs ordered are listed, but only abnormal results are displayed) Labs Reviewed  CBG MONITORING, ED - Abnormal; Notable for the following components:      Result Value   Glucose-Capillary 160 (*)    All other components within normal limits  CBC WITH DIFFERENTIAL/PLATELET  BASIC METABOLIC PANEL  I-STAT TROPONIN, ED    EKG EKG Interpretation  Date/Time:  Sunday August 13 2018 03:18:38 EDT Ventricular Rate:  67 PR Interval:    QRS Duration: 126 QT Interval:  467 QTC Calculation: 493 R Axis:   71 Text Interpretation:  Sinus rhythm Nonconducted  Premature atrial complexes Short PR interval Nonspecific intraventricular conduction delay No old tracing to compare Confirmed by Delora Fuel ([id number]) on 08/03/2018 3:47:55 AM   Radiology Dg Chest Portable 1 View  Result Date: 08/12/2018 CLINICAL DATA:  Status post intubation EXAM: PORTABLE CHEST 1 VIEW COMPARISON:  None. FINDINGS: Cardiac shadow is at the upper limits of normal in size but accentuated by the portable technique. Endotracheal tube is noted 16 mm above the carina. Gastric catheter extends into the stomach. Patchy infiltrates are noted throughout both lungs worse on the left than the right. No pneumothorax is seen. Small  left pleural effusion is noted. No bony abnormality is seen. IMPRESSION: Patchy bilateral infiltrates left greater than right. Associated left pleural effusion is noted. Electronically Signed   By: Inez Catalina M.D.   On: 08/12/2018 03:58    Procedures Procedure Name: Intubation Date/Time: 08/12/2018 3:20 AM Performed by: Delora Fuel, MD Pre-anesthesia Checklist: Patient identified, Timeout performed, Emergency Drugs available, Suction available and Patient being monitored Oxygen Delivery Method: Ambu bag Preoxygenation: Pre-oxygenation with 100% oxygen (King Airway) Laryngoscope Size: Glidescope, Miller and 4 Grade View: Grade I Tube size: 7.5 mm Number of attempts: 1 Airway Equipment and Method: Stylet and Video-laryngoscopy Placement Confirmation: ETT inserted through vocal cords under direct vision,  Breath sounds checked- equal and bilateral and CO2 detector Secured at: 22 cm Tube secured with: ETT holder Dental Injury: Teeth and Oropharynx as per pre-operative assessment        CRITICAL CARE Performed by: Delora Fuel Total critical care time: 65 minutes Critical care time was exclusive of separately billable procedures and treating other patients. Critical care was necessary to treat or prevent imminent or life-threatening deterioration. Critical care was time spent personally by me on the following activities: development of treatment plan with patient and/or surrogate as well as nursing, discussions with consultants, evaluation of patient's response to treatment, examination of patient, obtaining history from patient or surrogate, ordering and performing treatments and interventions, ordering and review of laboratory studies, ordering and review of radiographic studies, pulse oximetry and re-evaluation of patient's condition.  Medications Ordered in ED Medications  EPINEPHrine (ADRENALIN) 4 mg in dextrose 5 % 250 mL (0.016 mg/mL) infusion (has no administration in time range)      Initial Impression / Assessment and Plan / ED Course  I have reviewed the triage vital signs and the nursing notes.  Pertinent labs & imaging results that were available during my care of the patient were reviewed by me and considered in my medical decision making (see chart for details).  Out of hospital cardiac arrest with return of spontaneous circulation, but no neurologic function.  King airway is removed and replaced with endotracheal tube.  Given her underlying lung cancer, probably not a candidate for cooling protocol, but will discuss with critical care.  Currently, she is being maintained on an epinephrine drip.  She is given IV fluids.  Post intubation chest x-ray shows adequate position of ET tube.  Old records are not immediately available.  Discussed with Dr. Jimmy Footman of critical care service, patient felt not to be candidate for cooling.  ECG shows no acute process, nonconducted PAC identified.  Labs are pending.  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  Family  has arrived and confirmed that she was supposed to be DNR, but they do not want to take her off of life support until they can speak with her son.  She will be maintained with epinephrine drip at current rate and on ventilator until family can decide whether to take her off life support.  Son has been reached by phone.  He does not wish patient to be taken off life support until he can get here.  He was advised that her situation is such that she may not survive that long, but will not remove from life support until he arrives and is able to see her.  Case is discussed with Dr. Jimmy Footman of critical care service who agrees to admit the patient.  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

## 2018-08-13 NOTE — ED Notes (Signed)
Per ems upper dentures were left at home, only lower dentures here

## 2018-08-13 NOTE — ED Notes (Signed)
ED TO INPATIENT HANDOFF REPORT  ED Nurse Name and Phone #: Suezanne Jacquet [phone]  S Name/Age/Gender Amanda Jackson 78 y.o. female Room/Bed: RESUSC/RESUSC  Code Status   Code Status: DNR  Home/SNF/Other Home  Triage Complete: Triage complete  Chief Complaint cpr  Triage Note Patient presents to ED via [facility] states she was last seen normal 15 PTA of ems. Patient was talking with family and was sleeping in her room and realized patient wasn't responding. CPR started by FD at [place] and return of [place] patient was given Epip x 9 and started on Epip drip, patient went asystolic at 5974 and return of ROSC at [place]. Thelma Comp with Hepta filter per FD> upon arrival to ED patient in NSR rate at 60.  Per ems patient was recently Dx. With lung Cancer and had procedure at [facility]  In  the last several weeks.    Allergies Allergies not on file  Level of Care/Admitting Diagnosis ED Disposition    ED Disposition Condition [facility] Area: [place] [[id number]]  Level of Care: ICU [6]  Covid Evaluation: N/A  Diagnosis: Cardiac arrest ([place]) [427.5.ICD-9-CM]  Admitting Physician: Crista Luria [phone]  Attending Physician: Crista Luria [phone]  Estimated length of stay: 3 - 4 days  Certification:: I certify this patient will need inpatient services for at least 2 midnights  PT Class (Do Not Modify): Inpatient [101]  PT Acc Code (Do Not Modify): Private [1]       B Medical/Surgery History Past Medical History:  Diagnosis Date  . Cancer [facility])    lung cancer   Past Surgical History:  Procedure Laterality Date  . LUNG SURGERY       A IV Location/Drains/Wounds Patient Lines/Drains/Airways Status   Active Line/Drains/Airways    Name:   Placement date:   Placement time:   Site:   Days:   Peripheral IV 08/18/2018 Right Antecubital   08/08/2018    0340    Antecubital   less than 1   NG/OG Tube Orogastric 14 Fr.   08/23/2018    0341    -   less  than 1   Urethral Catheter Charlett Nose, EMT Latex 14 Fr.   07/28/2018    0351    Latex   less than 1   Airway 7.5 mm   08/03/2018    0325     less than 1          Intake/Output Last 24 hours No intake or output data in the 24 hours ending 08/06/2018 0534  Labs/Imaging Results for orders placed or performed during the hospital encounter of 08/03/2018 (from the past 48 hour(s))  I-Stat Troponin, ED (not at [facility])     Status: None   Collection Time: 08/11/2018  3:34 AM  Result Value Ref Range   Troponin i, poc 0.05 0.00 - 0.08 ng/mL   Comment 3            Comment: Due to the release kinetics of cTnI, a negative result within the first hours of the onset of symptoms does not rule out myocardial infarction with certainty. If myocardial infarction is still suspected, repeat the test at appropriate intervals.   Basic metabolic panel     Status: Abnormal   Collection Time: 07/31/2018  3:35 AM  Result Value Ref Range   Sodium 144 135 - 145 mmol/L   Potassium 4.4 3.5 - 5.1 mmol/L   Chloride 109 98 - 111 mmol/L  CO2 21 (L) 22 - 32 mmol/L   Glucose, Bld 246 (H) 70 - 99 mg/dL   BUN 16 8 - 23 mg/dL   Creatinine, Ser 1.04 (H) 0.44 - 1.00 mg/dL   Calcium 9.5 8.9 - 10.3 mg/dL   GFR calc non Af Amer 52 (L) >60 mL/min   GFR calc Af Amer >60 >60 mL/min   Anion gap 14 5 - 15    Comment: Performed at [facility] [address], [place], [place] [address]  CBG monitoring, ED     Status: Abnormal   Collection Time: 08/04/2018  3:41 AM  Result Value Ref Range   Glucose-Capillary 160 (H) 70 - 99 mg/dL   Dg Chest Portable 1 View  Result Date: 08/17/2018 CLINICAL DATA:  Status post intubation EXAM: PORTABLE CHEST 1 VIEW COMPARISON:  None. FINDINGS: Cardiac shadow is at the upper limits of normal in size but accentuated by the portable technique. Endotracheal tube is noted 16 mm above the carina. Gastric catheter extends into the stomach. Patchy infiltrates are noted throughout both lungs worse on the left  than the right. No pneumothorax is seen. Small left pleural effusion is noted. No bony abnormality is seen. IMPRESSION: Patchy bilateral infiltrates left greater than right. Associated left pleural effusion is noted. Electronically Signed   By: Inez Catalina M.D.   On: 08/16/2018 03:58    Pending Labs Unresulted Labs (From admission, onward)    Start     Ordered   Sep 05, 2018 6045  Basic metabolic panel  Tomorrow morning,   R     08/16/2018 0525   Sep 05, 2018 0500  Magnesium  Tomorrow morning,   R     08/07/2018 0525   09/05/18 0500  Phosphorus  Tomorrow morning,   R     07/28/2018 0525   2018-09-05 0500  CBC  Tomorrow morning,   R     07/29/2018 0525   08/05/2018 0524  Culture, respiratory (tracheal aspirate)  Once,   R     07/31/2018 0525   08/03/2018 0524  Culture, blood (routine x 2)  BLOOD CULTURE X 2,   R     08/09/2018 0525   08/21/2018 0524  Urine culture  Once,   R     07/27/2018 0525   07/26/2018 0524  Blood gas, arterial  Once,   R     08/07/2018 0525   08/10/2018 0323  CBC with Differential  ONCE - STAT,   STAT     08/08/2018 0322          Vitals/Pain Today's Vitals   08/16/2018 0436 08/09/2018 0438 08/20/2018 0440 08/22/2018 0500  BP: (!) 92/55 (!) 92/58 (!) 93/57 (!) 77/54  Pulse: 85 85 86 81  Resp: 16 16 16 16   Temp:      TempSrc:      SpO2: 100% 100% 100% 100%  Weight:      Height:        Isolation Precautions No active isolations  Medications Medications  EPINEPHrine (ADRENALIN) 4 mg in dextrose 5 % 250 mL (0.016 mg/mL) infusion (15 mcg/min Intravenous Rate/Dose Change 07/27/2018 0427)  enoxaparin (LOVENOX) injection 40 mg (has no administration in time range)  famotidine (PEPCID) IVPB 20 mg premix (has no administration in time range)  piperacillin-tazobactam (ZOSYN) IVPB 3.375 g (has no administration in time range)  acetaminophen (TYLENOL) tablet 650 mg (has no administration in time range)  ondansetron (ZOFRAN) injection 4 mg (has no administration in time range)  0.9 %  sodium chloride  infusion (has no administration in time range)    Mobility       , Pulmonary Assessment Handoff:  Lung sounds: Bilateral Breath Sounds: Diminished O2 Device: Ventilator        R Recommendations: See Admitting Provider Note  Report given to:   Additional Notes: N/A

## 2018-08-13 NOTE — Progress Notes (Signed)
Pharmacy Antibiotic Note  Amanda Jackson is a 77 y.o. female admitted on 08/04/2018 with sepsis.  Pharmacy has been consulted for vancomycin dosing.  Plan: Vancomycin 1gm IV x 1 then 750 mg IV q36 hours F/u renal function, cultures and clinical course  Height: 5\' 6"  (167.6 cm) Weight: 100 lb (45.4 kg) IBW/kg (Calculated) : 59.3  Temp (24hrs), Avg:94.4 F (34.7 C), Min:93.7 F (34.3 C), Max:95.1 F (35.1 C)  Recent Labs  Lab 07/29/2018 0335  CREATININE 1.04*    Estimated Creatinine Clearance: 32.5 mL/min (A) (by C-G formula based on SCr of 1.04 mg/dL (H)).    Allergies not on file   Thank you for allowing pharmacy to be a part of this patient's care.  Excell Seltzer Poteet 07/31/2018 5:48 AM

## 2018-08-14 DIAGNOSIS — I469 Cardiac arrest, cause unspecified: Principal | ICD-10-CM

## 2018-08-14 DIAGNOSIS — Z515 Encounter for palliative care: Secondary | ICD-10-CM

## 2018-08-14 DIAGNOSIS — C3492 Malignant neoplasm of unspecified part of left bronchus or lung: Secondary | ICD-10-CM

## 2018-08-14 LAB — BASIC METABOLIC PANEL
Anion gap: 10 (ref 5–15)
BUN: 18 mg/dL (ref 8–23)
CO2: 26 mmol/L (ref 22–32)
Calcium: 9.5 mg/dL (ref 8.9–10.3)
Chloride: 105 mmol/L (ref 98–111)
Creatinine, Ser: 0.82 mg/dL (ref 0.44–1.00)
GFR calc Af Amer: >60 mL/min (ref >60)
GFR calc non Af Amer: >60 mL/min (ref >60)
Glucose, Bld: 133 mg/dL — ABNORMAL HIGH (ref 70–99)
Potassium: 3.3 mmol/L — ABNORMAL LOW (ref 3.5–5.1)
Sodium: 141 mmol/L (ref 135–145)

## 2018-08-14 LAB — URINE CULTURE: Culture: NO GROWTH

## 2018-08-14 LAB — PHOSPHORUS: Phosphorus: 2.9 mg/dL (ref 2.5–4.6)

## 2018-08-14 LAB — MAGNESIUM: Magnesium: 1.7 mg/dL (ref 1.7–2.4)

## 2018-08-14 MED ORDER — HALOPERIDOL LACTATE 2 MG/ML PO CONC
0.5000 mg | ORAL | Status: DC | PRN
  Filled 2018-08-14: qty 0.3

## 2018-08-14 MED ORDER — MORPHINE BOLUS VIA INFUSION
1.0000 mg | INTRAVENOUS | Status: DC | PRN
  Filled 2018-08-14: qty 4

## 2018-08-14 MED ORDER — MORPHINE 100MG IN NS 100ML (1MG/ML) PREMIX INFUSION
0.5000 mg/h | INTRAVENOUS | Status: DC
  Filled 2018-08-14: qty 100

## 2018-08-14 MED ORDER — LORAZEPAM 1 MG PO TABS: 1.0000 mg | ORAL_TABLET | ORAL | Status: DC | PRN

## 2018-08-14 MED ORDER — LORAZEPAM 2 MG/ML IJ SOLN
1.0000 mg | INTRAMUSCULAR | Status: DC | PRN
  Filled 2018-08-14: qty 1

## 2018-08-14 MED ORDER — MORPHINE BOLUS VIA INFUSION
2.0000 mg | INTRAVENOUS | Status: DC | PRN
  Administered 2018-08-14 (×2): 4 mg via INTRAVENOUS
  Filled 2018-08-14: qty 4

## 2018-08-14 MED ORDER — LORAZEPAM 2 MG/ML PO CONC: 1.0000 mg | ORAL | Status: DC | PRN

## 2018-08-14 MED ORDER — GLYCOPYRROLATE 0.2 MG/ML IJ SOLN
0.4000 mg | Freq: Three times a day (TID) | INTRAMUSCULAR | Status: DC
  Administered 2018-08-14: 16:00:00 0.4 mg via INTRAVENOUS
  Filled 2018-08-14: qty 2

## 2018-08-14 MED ORDER — BIOTENE DRY MOUTH MT LIQD: 15.0000 mL | OROMUCOSAL | Status: DC | PRN

## 2018-08-14 MED ORDER — SODIUM CHLORIDE 0.9% FLUSH: 3.0000 mL | Freq: Two times a day (BID) | INTRAVENOUS | Status: DC

## 2018-08-14 MED ORDER — ONDANSETRON 4 MG PO TBDP: 4.0000 mg | ORAL_TABLET | Freq: Four times a day (QID) | ORAL | Status: DC | PRN

## 2018-08-14 MED ORDER — GLYCOPYRROLATE 1 MG PO TABS
1.0000 mg | ORAL_TABLET | Freq: Three times a day (TID) | ORAL | Status: DC
  Filled 2018-08-14 (×2): qty 1

## 2018-08-14 MED ORDER — HALOPERIDOL LACTATE 5 MG/ML IJ SOLN: 0.5000 mg | INTRAMUSCULAR | Status: DC | PRN

## 2018-08-14 MED ORDER — GLYCOPYRROLATE 1 MG PO TABS
1.0000 mg | ORAL_TABLET | ORAL | Status: DC | PRN
  Filled 2018-08-14: qty 1

## 2018-08-14 MED ORDER — SODIUM CHLORIDE 0.9 % IV SOLN
12.5000 mg | Freq: Four times a day (QID) | INTRAVENOUS | Status: DC | PRN
  Filled 2018-08-14: qty 0.5

## 2018-08-14 MED ORDER — LORAZEPAM 2 MG/ML IJ SOLN: 1.0000 mg | INTRAMUSCULAR | Status: DC | PRN

## 2018-08-14 MED ORDER — DIPHENHYDRAMINE HCL 50 MG/ML IJ SOLN: 12.5000 mg | INTRAMUSCULAR | Status: DC | PRN

## 2018-08-14 MED ORDER — SODIUM CHLORIDE 0.9% FLUSH: 3.0000 mL | INTRAVENOUS | Status: DC | PRN

## 2018-08-14 MED ORDER — SODIUM CHLORIDE 0.9 % IV SOLN: 250.0000 mL | INTRAVENOUS | Status: DC | PRN

## 2018-08-14 MED ORDER — ONDANSETRON HCL 4 MG/2ML IJ SOLN: 4.0000 mg | Freq: Four times a day (QID) | INTRAMUSCULAR | Status: DC | PRN

## 2018-08-14 MED ORDER — HALOPERIDOL 0.5 MG PO TABS
0.5000 mg | ORAL_TABLET | ORAL | Status: DC | PRN
  Filled 2018-08-14: qty 1

## 2018-08-14 MED ORDER — GLYCOPYRROLATE 0.2 MG/ML IJ SOLN: 0.2000 mg | INTRAMUSCULAR | Status: DC | PRN

## 2018-08-14 MED ORDER — ACETAMINOPHEN 650 MG RE SUPP: 650.0000 mg | Freq: Four times a day (QID) | RECTAL | Status: DC | PRN

## 2018-08-14 MED ORDER — ACETAMINOPHEN 325 MG PO TABS: 650.0000 mg | ORAL_TABLET | Freq: Four times a day (QID) | ORAL | Status: DC | PRN

## 2018-08-14 MED ORDER — MORPHINE SULFATE (PF) 4 MG/ML IV SOLN
4.0000 mg | Freq: Once | INTRAVENOUS | Status: AC
  Administered 2018-08-14: 14:00:00 4 mg via INTRAVENOUS

## 2018-08-14 MED ORDER — GLYCOPYRROLATE 0.2 MG/ML IJ SOLN: 0.2000 mg | Freq: Three times a day (TID) | INTRAMUSCULAR | Status: DC

## 2018-08-14 MED ORDER — POLYVINYL ALCOHOL 1.4 % OP SOLN
1.0000 [drp] | Freq: Four times a day (QID) | OPHTHALMIC | Status: DC | PRN
  Filled 2018-08-14: qty 15

## 2018-08-14 MED ORDER — MORPHINE 100MG IN NS 100ML (1MG/ML) PREMIX INFUSION
4.0000 mg/h | INTRAVENOUS | Status: DC
  Administered 2018-08-14: 14:00:00 4 mg/h via INTRAVENOUS

## 2018-08-14 MED ORDER — LORAZEPAM 2 MG/ML IJ SOLN
2.0000 mg | Freq: Once | INTRAMUSCULAR | Status: AC
  Administered 2018-08-14: 14:00:00 2 mg via INTRAVENOUS

## 2018-08-17 ENCOUNTER — Encounter: Payer: Self-pay | Admitting: Pulmonary Disease

## 2018-08-18 LAB — CULTURE, BLOOD (ROUTINE X 2)
Culture: NO GROWTH
Culture: NO GROWTH
Special Requests: ADEQUATE
Special Requests: ADEQUATE

## 2018-08-22 ENCOUNTER — Telehealth: Payer: Self-pay | Admitting: *Deleted

## 2018-08-22 NOTE — Telephone Encounter (Signed)
Received Original D/C from [facility]  ,D/C forwarded to Dr.Wert for Dr.Icard for signature.

## 2018-08-25 NOTE — Progress Notes (Signed)
Family at bedside. Will transition to comfort care and go forward with terminal extubation.  Orders placed.   Alphonzo Grieve, MD PGY3 Pager [phone]

## 2018-08-25 NOTE — Progress Notes (Signed)
RT NOTE: RT extubated patient to comfort per family wishes and MD order with RN at bedside.

## 2018-08-25 NOTE — Progress Notes (Signed)
Patient passed at 1640. Asystole rhythm strip placed on chart. This RN and Juleen Starr, RN declared.

## 2018-08-25 NOTE — Discharge Summary (Signed)
Physician Discharge Summary  Amanda Jackson [id number] DOB: 07-09-1940 DOA: 2018/08/25  PCP: Patient, No Pcp Per  Admit date: 25-Aug-2018  Death date: Aug 26, 2018 07-17-1602  Admitted From:Home  Disposition:  Expired   Brief/Interim Summary: Per HPI: Amanda Jackson is a 78 year old female with history significant for lung cancer diagnosed about a year ago for which she follows at [facility].  She presented to the ED via EMS after she was found down at home.  History obtained from patient's husband.  He states that 2 weeks ago patient was having progressive shortness of breath.  He was seen at [facility] and was found to have a pleural effusion for which she underwent thoracentesis.  He states that after that patient was doing remarkably better and her shortness of breath markedly improved.  She has not been having any fever or chills.  She has been having a cough related to her cancer and has been on anti-tussive.  Patient's husband states that all day long the patient was talking about her death and was worried about how her husband would do if she died.  Later at night patient was diaphoretic without fever.  She then went to lay down and suddenly called out for her husband.  Husband states that when he went to go check on her in the bedroom she was unresponsive.  He called EMS started CPR.  She had about 27 minutes of CPR before achieving ROSC. She was given epinephrine 9x.  Initial rhythm was asystole.  She then went into cardiac arrest again with additional 80minutes of CPR.   Patient's husband states that the patient had always expressed that she never wanted to be resuscitated up and put on a ventilator.  Patient has a son who lives in [place] and he will be traveling to come and see his mother.  Patient was admitted with circulatory shock and metabolic encephalopathy s/p cardiac arrest in the setting of stage 4 NSCL adenocarcinoma. She was maintained on epinephrine drip along  with Vancomycin and Zosyn. Given family and patient wishes palliative care was consulted and terminal extubation was performed at 1450. Patient subsequently expired at 1604. No other acute events noted.  Discharge Diagnoses:  Active Problems:   Cardiac arrest ([place])    Allergies not on file  Consultations:  PCCM   Procedures/Studies: Dg Chest Portable 1 View  Result Date: 08/25/2018 CLINICAL DATA:  Status post intubation EXAM: PORTABLE CHEST 1 VIEW COMPARISON:  None. FINDINGS: Cardiac shadow is at the upper limits of normal in size but accentuated by the portable technique. Endotracheal tube is noted 16 mm above the carina. Gastric catheter extends into the stomach. Patchy infiltrates are noted throughout both lungs worse on the left than the right. No pneumothorax is seen. Small left pleural effusion is noted. No bony abnormality is seen. IMPRESSION: Patchy bilateral infiltrates left greater than right. Associated left pleural effusion is noted. Electronically Signed   By: Inez Catalina M.D.   On: 2018/08/25 03:58       The results of significant diagnostics from this hospitalization (including imaging, microbiology, ancillary and laboratory) are listed below for reference.     Microbiology: Recent Results (from the past 240 hour(s))  Culture, blood (routine x 2)     Status: None (Preliminary result)   Collection Time: 08-25-18  5:20 AM  Result Value Ref Range Status   Specimen Description BLOOD RIGHT ARM  Final   Special Requests   Final    BOTTLES DRAWN AEROBIC  AND ANAEROBIC Blood Culture adequate volume   Culture   Final    NO GROWTH < 12 HOURS Performed at [facility] [address], [place], [place] [address]    Report Status PENDING  Incomplete  Culture, blood (routine x 2)     Status: None (Preliminary result)   Collection Time: 08/18/2018  5:55 AM  Result Value Ref Range Status   Specimen Description BLOOD LEFT ARM  Final   Special Requests   Final    BOTTLES  DRAWN AEROBIC AND ANAEROBIC Blood Culture adequate volume   Culture   Final    NO GROWTH < 12 HOURS Performed at [facility], [place] [address], [place], [place] [address]    Report Status PENDING  Incomplete  MRSA PCR Screening     Status: None   Collection Time: 08/09/2018  6:36 AM  Result Value Ref Range Status   MRSA by PCR NEGATIVE NEGATIVE Final    Comment:        The GeneXpert MRSA Assay (FDA approved for NASAL specimens only), is one component of a comprehensive MRSA colonization surveillance program. It is not intended to diagnose MRSA infection nor to guide or monitor treatment for MRSA infections. Performed at [facility], [place] [address], [place],  [address]   Urine culture     Status: None   Collection Time: 08/03/2018  8:25 AM  Result Value Ref Range Status   Specimen Description URINE, RANDOM  Final   Special Requests NONE  Final   Culture   Final    NO GROWTH Performed at [facility], [place] [address], [place],  [address]    Report Status 12-Sep-2018 FINAL  Final     Labs: BNP (last 3 results) No results for input(s): BNP in the last 8760 hours. Basic Metabolic Panel: Recent Labs  Lab 08/10/2018 0335 08/12/2018 0602 Sep 12, 2018 0242  NA 144 141 141  K 4.4 3.5 3.3*  CL 109  --  105  CO2 21*  --  26  GLUCOSE 246*  --  133*  BUN 16  --  18  CREATININE 1.04*  --  0.82  CALCIUM 9.5  --  9.5  MG  --   --  1.7  PHOS  --   --  2.9   Liver Function Tests: Recent Labs  Lab 08/05/2018 0815  AST 172*  ALT 104*  ALKPHOS 108  BILITOT 0.7  PROT 5.9*  ALBUMIN 2.0*   No results for input(s): LIPASE, AMYLASE in the last 168 hours. No results for input(s): AMMONIA in the last 168 hours. CBC: Recent Labs  Lab 08/12/2018 0335 08/08/2018 0602  WBC 12.5*  --   NEUTROABS 8.0*  --   HGB 8.5* 8.8*  HCT 31.8* 26.0*  MCV 96.7  --   PLT 349  --    Cardiac Enzymes: No results for input(s): CKTOTAL, CKMB, CKMBINDEX, TROPONINI in the last 168  hours. BNP: Invalid input(s): POCBNP CBG: Recent Labs  Lab 07/27/2018 0341  GLUCAP 160*   D-Dimer No results for input(s): DDIMER in the last 72 hours. Hgb A1c No results for input(s): HGBA1C in the last 72 hours. Lipid Profile No results for input(s): CHOL, HDL, LDLCALC, TRIG, CHOLHDL, LDLDIRECT in the last 72 hours. Thyroid function studies No results for input(s): TSH, T4TOTAL, T3FREE, THYROIDAB in the last 72 hours.  Invalid input(s): FREET3 Anemia work up No results for input(s): VITAMINB12, FOLATE, FERRITIN, TIBC, IRON, RETICCTPCT  in the last 72 hours. Urinalysis No results found for: COLORURINE, APPEARANCEUR, [place], [place], [place], [place], [place], [place], PROTEINUR, UROBILINOGEN, NITRITE, LEUKOCYTESUR Sepsis Labs Invalid input(s): PROCALCITONIN,  WBC,  LACTICIDVEN Microbiology Recent Results (from the past 240 hour(s))  Culture, blood (routine x 2)     Status: None (Preliminary result)   Collection Time: 08/12/2018  5:20 AM  Result Value Ref Range Status   Specimen Description BLOOD RIGHT ARM  Final   Special Requests   Final    BOTTLES DRAWN AEROBIC AND ANAEROBIC Blood Culture adequate volume   Culture   Final    NO GROWTH < 12 HOURS Performed at [facility], [address] [place], [place] [address]    Report Status PENDING  Incomplete  Culture, blood (routine x 2)     Status: None (Preliminary result)   Collection Time: 08/22/2018  5:55 AM  Result Value Ref Range Status   Specimen Description BLOOD LEFT ARM  Final   Special Requests   Final    BOTTLES DRAWN AEROBIC AND ANAEROBIC Blood Culture adequate volume   Culture   Final    NO GROWTH < 12 HOURS Performed at [facility], [place] [address] [place], [place] [address]    Report Status PENDING  Incomplete  MRSA PCR Screening     Status: None   Collection Time: 08/10/2018  6:36 AM  Result Value Ref Range Status   MRSA by PCR NEGATIVE NEGATIVE Final    Comment:        The GeneXpert MRSA  Assay (FDA approved for NASAL specimens only), is one component of a comprehensive MRSA colonization surveillance program. It is not intended to diagnose MRSA infection nor to guide or monitor treatment for MRSA infections. Performed at [facility], [place] [address], [place], [place] [address]   Urine culture     Status: None   Collection Time: 08/17/2018  8:25 AM  Result Value Ref Range Status   Specimen Description URINE, RANDOM  Final   Special Requests NONE  Final   Culture   Final    NO GROWTH Performed at [facility], [place] [address], [place], [place] [address]    Report Status 09-11-2018 FINAL  Final     Time coordinating discharge: 35 minutes  SIGNED:   Rodena Goldmann, DO [facility] 09/11/18, 10:51 AM  If 7PM-7AM, please contact night-coverage [email or web address] Password [email or web address]

## 2018-08-25 NOTE — Progress Notes (Signed)
Contacted [facility] nurse about HR 140's and b/p 160/40's was informed to continue with Fentanyl pushes and will continue to monitor.

## 2018-08-25 NOTE — Progress Notes (Signed)
50 mL morphine wasted with Lindwood Coke, RN as witness.

## 2018-08-25 NOTE — Progress Notes (Signed)
Tried to contact husband Amanda Jackson to get a more ideal time of son's arrival from [place], but no answer.

## 2018-08-25 NOTE — Telephone Encounter (Signed)
Received original signed D/C, funeral notified for pick up

## 2018-08-25 NOTE — Progress Notes (Signed)
Nutrition Brief Note  RD drawn to pt due to new vent. Chart reviewed. Plan is for terminal extubation and transition to full comfort care when family arrives. No nutrition interventions warranted at this time.  Please consult as needed.    Gaynell Face, MS, RD, LDN Inpatient Clinical Dietitian Pager: [phone] Weekend/After Hours: [phone]

## 2018-08-25 NOTE — Progress Notes (Signed)
I responded to a page from the nurse to provide spiritual support for the patient's family. I visited the patient's room with her son, neighbor, and care-giver present. I shared words of encouragement and led in prayer. I shared that the Chaplain is available for additional support as needed or requested.    Aug 22, 2018 1300  Clinical Encounter Type  Visited With Patient and family together  Visit Type Spiritual support  Referral From Nurse  Consult/Referral To Chaplain  Spiritual Encounters  Spiritual Needs Prayer;Emotional  Stress Factors  Patient Stress Factors None identified  Family Stress Factors Exhausted    Chaplain Dr Redgie Grayer

## 2018-08-25 NOTE — Consult Note (Signed)
Consultation Note Date: Sep 12, 2018   Patient Name: Amanda Jackson  DOB: 1940/12/20  MRN: [id number]  Age / Sex: 78 y.o., female  PCP: Patient, No Pcp Per Referring Physician: Rodena Goldmann, DO  Reason for Consultation: Non pain symptom management, Pain control, Psychosocial/spiritual support, Terminal Care and Withdrawal of life-sustaining treatment  HPI/Patient Profile: 78 y.o. female  with past medical history of metastatic lung cancer who was admitted on 07/30/2018 after cardiac arrest x 2.   Per notes she underwent a total of 43 minutes of CPR and received epinephrine 9x.  She is currently intubated and non-responsive.  Clinical Assessment and Goals of Care:  I have reviewed medical records including EPIC notes, labs and imaging, received report from [facility] RN team, assessed the patient and then met at the bedside along with her only son Amanda Jackson), care taker and close friend  to discuss diagnosis prognosis, EOL wishes.  Amanda Jackson had made it know that she did not want resuscitation or life support.  Her family present here is in agreement with liberating her from the vent. We discussed the process of shifting to comfort.  They understand that she will not survive long.  Amanda Jackson is shocked and frequently sobs during our conversation.   Both Billy and her care taker express that they will likely not be able to stay for her extubation and passing.  Questions and concerns were addressed.  The family was given my contact information and encouraged to call with questions or concerns.    Primary Decision Maker:  NEXT OF KIN Son Amanda Jackson    SUMMARY OF RECOMMENDATIONS    Begin comfort medications and discontinue interventions that do not contribute to comfort. Will extubate at appropriate time. [facility] death.  Code Status/Advance Care Planning:  DNR   Additional Recommendations (Limitations, Scope,  Preferences):  Full Comfort Care  Palliative Prophylaxis:   Frequent Pain Assessment  Psycho-social/Spiritual:   Desire for further Chaplaincy support:  Chaplain has been present.  Prognosis:  Minutes to hours.    Discharge Planning: Anticipated Hospital Death      Primary Diagnoses: Present on Admission:  Cardiac arrest [facility])   I have reviewed the medical record, interviewed the patient and family, and examined the patient. The following aspects are pertinent.  Past Medical History:  Diagnosis Date   Cancer ([employer])    lung cancer   Social History   Socioeconomic History   Marital status: Single    Spouse name: Not on file   Number of children: Not on file   Years of education: Not on file   Highest education level: Not on file  Occupational History   Not on file  Social Needs   Financial resource strain: Not on file   Food insecurity:    Worry: Not on file    Inability: Not on file   Transportation needs:    Medical: Not on file    Non-medical: Not on file  Tobacco Use   Smoking status: Unknown If Ever Smoked  Substance and  Sexual Activity   Alcohol use: Not on file   Drug use: Not on file   Sexual activity: Not on file  Lifestyle   Physical activity:    Days per week: Not on file    Minutes per session: Not on file   Stress: Not on file  Relationships   Social connections:    Talks on phone: Not on file    Gets together: Not on file    Attends religious service: Not on file    Active member of club or organization: Not on file    Attends meetings of clubs or organizations: Not on file    Relationship status: Not on file  Other Topics Concern   Not on file  Social History Narrative   Not on file   No family history on file. Scheduled Meds:  chlorhexidine gluconate (MEDLINE KIT)  15 mL Mouth Rinse BID   glycopyrrolate  1 mg Oral TID   Or   glycopyrrolate  0.2 mg Subcutaneous TID   Or   glycopyrrolate  0.4 mg  Intravenous TID   LORazepam  2 mg Intravenous Once   mouth rinse  15 mL Mouth Rinse 10 times per day    morphine injection  4 mg Intravenous Once   Continuous Infusions:  sodium chloride Stopped (08-25-2018 0928)   chlorproMAZINE (THORAZINE) IV     morphine     morphine     PRN Meds:.acetaminophen **OR** acetaminophen, antiseptic oral rinse, chlorproMAZINE (THORAZINE) IV, diphenhydrAMINE, fentaNYL (SUBLIMAZE) injection, haloperidol **OR** haloperidol **OR** haloperidol lactate, LORazepam **OR** LORazepam **OR** LORazepam, LORazepam **OR** [DISCONTINUED] LORazepam **OR** LORazepam, morphine, morphine, ondansetron (ZOFRAN) IV, ondansetron **OR** ondansetron (ZOFRAN) IV, polyvinyl alcohol Allergies not on file Review of Systems intubated.  Physical Exam  Frail elderly female.  Intubated and chewing reflexively on ETT CV tachy and irregular with murmur Resp on vent.  Coarse breath sounds. Abdomen soft, nd  Vital Signs: BP (!) 142/92    Pulse (!) 128    Temp 98.3 F (36.8 C) (Oral)    Resp 20    Ht '5\' 6"'$  (1.676 m)    Wt 47.3 kg    SpO2 97%    BMI 16.83 kg/m  Pain Scale: CPOT       SpO2: SpO2: 97 % O2 Device:SpO2: 97 % O2 Flow Rate: .   IO: Intake/output summary:   Intake/Output Summary (Last 24 hours) at Aug 25, 2018 1358 Last data filed at 25-Aug-2018 1200 Gross per 24 hour  Intake 694.35 ml  Output 763 ml  Net -68.65 ml    LBM:   Baseline Weight: Weight: 45.4 kg Most recent weight: Weight: 47.3 kg     Palliative Assessment/Data: 10%     Time In: 1:30 Time Out: 2:10 Time Total: 50 min. Greater than 50%  of this time was spent counseling and coordinating care related to the above assessment and plan.  Signed by: Florentina Jenny, PA-C Palliative Medicine Pager: [phone]  Please contact Palliative Medicine Team phone at [phone] for questions and concerns.  For individual provider: See Shea Evans

## 2018-08-25 NOTE — Progress Notes (Addendum)
NAMEChanya Jackson, MRN:  [id number], DOB:  11-Jul-1940, LOS: 1 ADMISSION DATE:  07/31/2018, CONSULTATION DATE:  08/06/2018 REFERRING MD:  ED, CHIEF COMPLAINT: Cardiac arrest  History of present illness   [name] is a 78 year old female with history significant for lung cancer diagnosed about a year ago for which she follows at [facility].  She presented to the ED via EMS after she was found down at home.  History obtained from patient's husband.  He states that 2 weeks ago patient was having progressive shortness of breath.  He was seen at [facility] and was found to have a pleural effusion for which she underwent thoracentesis.  He states that after that patient was doing remarkably better and her shortness of breath markedly improved.  She has not been having any fever or chills.  She has been having a cough related to her cancer and has been on anti-tussive.  Patient's husband states that all day long the patient was talking about her death and was worried about how her husband would do if she died.  Later at night patient was diaphoretic without fever.  She then went to lay down and suddenly called out for her husband.  Husband states that when he went to go check on her in the bedroom she was unresponsive.  He called EMS started CPR.  She had about 27 minutes of CPR before achieving ROSC. She was given epinephrine 9x.  Initial rhythm was asystole.  She then went into cardiac arrest again with additional 33minutes of CPR.   Patient's husband states that the patient had always expressed that she never wanted to be resuscitated up and put on a ventilator.  Patient has a son who lives in [place] and he will be traveling to come and see his mother.  Past Medical History  -Lung cancer  Significant Hospital Events   -Cardiac arrest -Intubation  Consults:  -PCCM  Procedures:  -Endotracheal intubation 08/16/2018  Significant Diagnostic Tests:  CXR Patchy bilateral  infiltrates left greater than right. Associated left pleural effusion is noted.  Micro Data:  -Pending   Antimicrobials:  -Vanc and zosyn 4/19  Objective   Blood pressure (!) 145/96, pulse (!) 129, temperature 99.2 F (37.3 C), temperature source Oral, resp. rate 18, height 5\' 6"  (1.676 m), weight 47.3 kg, SpO2 98 %.    Vent Mode: PRVC FiO2 (%):  [50 %-60 %] 50 % Set Rate:  [16 bmp] 16 bmp Vt Set:  [470 mL] 470 mL PEEP:  [5 cmH20] 5 cmH20 Plateau Pressure:  [24 cmH20-28 cmH20] 28 cmH20   Intake/Output Summary (Last 24 hours) at 09-13-2018 0849 Last data filed at Sep 13, 2018 0700 Gross per 24 hour  Intake 664.11 ml  Output 1260 ml  Net -595.89 ml   Filed Weights   07/30/2018 0337 08/12/2018 8416  Weight: 45.4 kg 47.3 kg    Examination: General: Intubated and ventilated  HENT: ETT and OG in place Lungs: diffuse rhonchi bilaterally Cardiovascular: RRR, no m/r/g Abdomen: Soft, ND, decreased bowel sounds Extremities: 1+ pitting bil lower extremity edema, cool extremities Neuro: opens eyes to sternal rub only  Assessment & Plan:   S/p Cardiac arrest in setting of stage 4 NSCL adenocarcinoma Metabolic encephalopathy Circulatory shock Etiology primary respiratory failure vs PE vs primary cardiac etiology. Despite DNR wishes, CPR was initiated and patient intubated. Family wants to respect her wishes of passing in comfort and not receiving life supporting measures; currently waiting on son to  arrive from [place] prior to terminal extubation. Will maintain current level of care but without escalation. She has been weaned off of epinephrine. She has been off of sedation but has been receiving PRN fentanyl overnight due to tachycardia. Neuro exam is unchanged from yesterday - she will open eyes to pain but will not have other meaningful interaction. -maintain full vent support for now; plan for terminal extubation and transition to full comfort care when family arrives  -continue abx  for possible infectious etiology; wean based on cultures -PRN fentanyl for pain  Best practice:  Diet: NPO Pain/Anxiety/Delirium protocol (if indicated): PRN fentanyl VAP protocol (if indicated): Head of bed elevation, famotidine, oral care DVT prophylaxis: Enoxaparin GI prophylaxis: Famotidine Glucose control: Continue euglycemia Mobility: N/A Code Status: Patient is DNR Family Communication: Goals of care discussions with patient husband  Disposition: ICU   Labs   CBC: Recent Labs  Lab 08/08/2018 0335 08/15/2018 0602  WBC 12.5*  --   NEUTROABS 8.0*  --   HGB 8.5* 8.8*  HCT 31.8* 26.0*  MCV 96.7  --   PLT 349  --     Basic Metabolic Panel: Recent Labs  Lab 08/06/2018 0335 08/19/2018 0602 08-19-2018 0242  NA 144 141 141  K 4.4 3.5 3.3*  CL 109  --  105  CO2 21*  --  26  GLUCOSE 246*  --  133*  BUN 16  --  18  CREATININE 1.04*  --  0.82  CALCIUM 9.5  --  9.5  MG  --   --  1.7  PHOS  --   --  2.9   GFR: Estimated Creatinine Clearance: 42.9 mL/min (by C-G formula based on SCr of 0.82 mg/dL). Recent Labs  Lab 08/08/2018 0335  WBC 12.5*   ABG    Component Value Date/Time   PHART 7.324 (L) 08/18/2018 0602   PCO2ART 49.1 (H) 08/16/2018 0602   PO2ART 102.0 08/07/2018 0602   HCO3 25.6 07/28/2018 0602   TCO2 27 08/15/2018 0602   ACIDBASEDEF 1.0 08/09/2018 0602   O2SAT 97.0 08/24/2018 0602     CBG: Recent Labs  Lab 08/07/2018 0341  GLUCAP 160*    Review of Systems:   Unable to obtain as patient is intubated and unresponsive  Past Medical History  She,  has a past medical history of Cancer ([place]).   Surgical History    Past Surgical History:  Procedure Laterality Date  . LUNG SURGERY       Social History    Lives with husband; past smoker (quit in late 41s)  Family History   Her family history is not on file.   Allergies Allergies not on file   Home Medications  Prior to Admission medications   Not on [place], MD PGY3 Pager  [phone]  __________________________________________________________________________  PCCM Attending:   Dr. Jari Favre, called and spoke with family this morning. They are on there way to the hospital with plans for withdrawal of care.   CCM will be available to help with these discussions as needed and able to plans comfort care orders as needed.   Please notify use once family are present   Garner Nash, [facility] 08/19/2018 11:05 AM

## 2018-08-25 DEATH — deceased

## 2018-09-11 ENCOUNTER — Ambulatory Visit: Payer: Medicare Other | Admitting: Allergy and Immunology

## 2018-09-26 ENCOUNTER — Ambulatory Visit: Payer: Medicare Other | Admitting: Internal Medicine

## 2018-09-29 ENCOUNTER — Ambulatory Visit: Payer: Medicare Other | Admitting: Adult Health

## 2018-11-08 ENCOUNTER — Ambulatory Visit: Payer: Medicare Other | Admitting: Adult Health

## 2021-01-24 DEATH — deceased
